# Patient Record
Sex: Male | Born: 1945 | Race: White | Hispanic: No | Marital: Single | Smoking: Former smoker
Health system: Southern US, Community
[De-identification: ages and names within clinical notes are randomized; demographics above are authoritative.]

## PROBLEM LIST (undated history)

## (undated) DIAGNOSIS — Z87442 Personal history of urinary calculi: Secondary | ICD-10-CM

## (undated) DIAGNOSIS — S42001A Fracture of unspecified part of right clavicle, initial encounter for closed fracture: Secondary | ICD-10-CM

## (undated) DIAGNOSIS — F329 Major depressive disorder, single episode, unspecified: Secondary | ICD-10-CM

## (undated) DIAGNOSIS — Z791 Long term (current) use of non-steroidal anti-inflammatories (NSAID): Secondary | ICD-10-CM

## (undated) DIAGNOSIS — I442 Atrioventricular block, complete: Secondary | ICD-10-CM

## (undated) DIAGNOSIS — I10 Essential (primary) hypertension: Secondary | ICD-10-CM

## (undated) DIAGNOSIS — F32A Depression, unspecified: Secondary | ICD-10-CM

## (undated) DIAGNOSIS — R51 Headache: Secondary | ICD-10-CM

## (undated) DIAGNOSIS — K625 Hemorrhage of anus and rectum: Secondary | ICD-10-CM

## (undated) DIAGNOSIS — F419 Anxiety disorder, unspecified: Secondary | ICD-10-CM

## (undated) DIAGNOSIS — E876 Hypokalemia: Secondary | ICD-10-CM

## (undated) DIAGNOSIS — R011 Cardiac murmur, unspecified: Secondary | ICD-10-CM

## (undated) DIAGNOSIS — R519 Headache, unspecified: Secondary | ICD-10-CM

## (undated) DIAGNOSIS — F4322 Adjustment disorder with anxiety: Secondary | ICD-10-CM

## (undated) DIAGNOSIS — Z950 Presence of cardiac pacemaker: Secondary | ICD-10-CM

## (undated) DIAGNOSIS — E785 Hyperlipidemia, unspecified: Secondary | ICD-10-CM

## (undated) DIAGNOSIS — F909 Attention-deficit hyperactivity disorder, unspecified type: Secondary | ICD-10-CM

## (undated) DIAGNOSIS — J309 Allergic rhinitis, unspecified: Secondary | ICD-10-CM

## (undated) DIAGNOSIS — E119 Type 2 diabetes mellitus without complications: Secondary | ICD-10-CM

## (undated) HISTORY — PX: TONSILLECTOMY: SHX5217

## (undated) HISTORY — DX: Adjustment disorder with anxiety: F43.22

## (undated) HISTORY — PX: COLONOSCOPY: SHX174

## (undated) HISTORY — DX: Long term (current) use of non-steroidal anti-inflammatories (nsaid): Z79.1

## (undated) HISTORY — DX: Hemorrhage of anus and rectum: K62.5

---

## 1998-07-16 ENCOUNTER — Emergency Department (HOSPITAL_COMMUNITY): Admission: EM | Admit: 1998-07-16 | Discharge: 1998-07-16 | Payer: Self-pay | Admitting: Emergency Medicine

## 2006-03-22 ENCOUNTER — Ambulatory Visit: Payer: Self-pay | Admitting: Internal Medicine

## 2006-04-11 ENCOUNTER — Ambulatory Visit: Payer: Self-pay | Admitting: Internal Medicine

## 2006-04-17 ENCOUNTER — Ambulatory Visit: Payer: Self-pay | Admitting: Internal Medicine

## 2006-04-25 ENCOUNTER — Ambulatory Visit: Payer: Self-pay | Admitting: Internal Medicine

## 2007-01-03 HISTORY — PX: ORIF CLAVICLE FRACTURE: SUR924

## 2008-01-22 ENCOUNTER — Encounter: Payer: Self-pay | Admitting: Internal Medicine

## 2008-01-22 DIAGNOSIS — K625 Hemorrhage of anus and rectum: Secondary | ICD-10-CM

## 2008-01-22 HISTORY — DX: Hemorrhage of anus and rectum: K62.5

## 2008-02-11 ENCOUNTER — Ambulatory Visit: Payer: Self-pay | Admitting: Internal Medicine

## 2008-02-13 LAB — CONVERTED CEMR LAB
Albumin: 3.8 g/dL (ref 3.5–5.2)
Basophils Absolute: 0.6 10*3/uL — ABNORMAL HIGH (ref 0.0–0.1)
Bilirubin Urine: NEGATIVE
Cholesterol: 187 mg/dL (ref 0–200)
Creatinine, Ser: 0.9 mg/dL (ref 0.4–1.5)
Eosinophils Absolute: 0.5 10*3/uL (ref 0.0–0.6)
GFR calc Af Amer: 110 mL/min
HCT: 39.9 % (ref 39.0–52.0)
HDL: 24.5 mg/dL — ABNORMAL LOW (ref >39.0)
Hemoglobin: 13.7 g/dL (ref 13.0–17.0)
Hgb A1c MFr Bld: 5.9 % (ref 4.6–6.0)
LDL Cholesterol: 132 mg/dL — ABNORMAL HIGH (ref 0–99)
Leukocytes, UA: NEGATIVE
MCHC: 34.4 g/dL (ref 30.0–36.0)
MCV: 83.5 fL (ref 78.0–100.0)
Monocytes Absolute: 0.7 10*3/uL (ref 0.2–0.7)
Monocytes Relative: 6 % (ref 3.0–11.0)
Neutro Abs: 7.5 10*3/uL (ref 1.4–7.7)
Neutrophils Relative %: 65.4 % (ref 43.0–77.0)
PSA: 1.72 ng/mL (ref 0.10–4.00)
Potassium: 4.2 meq/L (ref 3.5–5.1)
Sodium: 142 meq/L (ref 135–145)
TSH: 1.06 microintl units/mL (ref 0.35–5.50)
Total Bilirubin: 0.6 mg/dL (ref 0.3–1.2)
Triglycerides: 152 mg/dL — ABNORMAL HIGH (ref 0–149)
Urobilinogen, UA: 0.2 (ref 0.0–1.0)
VLDL: 30 mg/dL (ref 0–40)

## 2008-02-19 ENCOUNTER — Ambulatory Visit: Payer: Self-pay | Admitting: Internal Medicine

## 2008-02-19 DIAGNOSIS — R609 Edema, unspecified: Secondary | ICD-10-CM

## 2008-02-19 DIAGNOSIS — E119 Type 2 diabetes mellitus without complications: Secondary | ICD-10-CM

## 2008-02-19 DIAGNOSIS — F329 Major depressive disorder, single episode, unspecified: Secondary | ICD-10-CM

## 2008-02-19 DIAGNOSIS — I10 Essential (primary) hypertension: Secondary | ICD-10-CM

## 2008-02-19 DIAGNOSIS — J309 Allergic rhinitis, unspecified: Secondary | ICD-10-CM | POA: Insufficient documentation

## 2008-02-19 DIAGNOSIS — F411 Generalized anxiety disorder: Secondary | ICD-10-CM | POA: Insufficient documentation

## 2008-02-19 DIAGNOSIS — E785 Hyperlipidemia, unspecified: Secondary | ICD-10-CM | POA: Insufficient documentation

## 2008-02-27 ENCOUNTER — Encounter: Payer: Self-pay | Admitting: Internal Medicine

## 2008-03-03 ENCOUNTER — Encounter: Payer: Self-pay | Admitting: Internal Medicine

## 2008-04-09 ENCOUNTER — Ambulatory Visit: Payer: Self-pay | Admitting: Internal Medicine

## 2008-04-09 LAB — CONVERTED CEMR LAB
AST: 16 units/L (ref 0–37)
Albumin: 3.8 g/dL (ref 3.5–5.2)
Alkaline Phosphatase: 70 units/L (ref 39–117)
Total Bilirubin: 0.9 mg/dL (ref 0.3–1.2)
Total CHOL/HDL Ratio: 7.3
Triglycerides: 60 mg/dL (ref 0–149)

## 2008-08-04 ENCOUNTER — Ambulatory Visit: Payer: Self-pay | Admitting: Internal Medicine

## 2008-08-04 DIAGNOSIS — F4322 Adjustment disorder with anxiety: Secondary | ICD-10-CM

## 2008-08-04 DIAGNOSIS — F988 Other specified behavioral and emotional disorders with onset usually occurring in childhood and adolescence: Secondary | ICD-10-CM | POA: Insufficient documentation

## 2008-08-04 DIAGNOSIS — IMO0002 Reserved for concepts with insufficient information to code with codable children: Secondary | ICD-10-CM

## 2008-08-10 ENCOUNTER — Encounter: Payer: Self-pay | Admitting: Internal Medicine

## 2008-08-30 ENCOUNTER — Ambulatory Visit: Payer: Self-pay | Admitting: Internal Medicine

## 2008-08-30 LAB — CONVERTED CEMR LAB
Calcium: 8.8 mg/dL (ref 8.4–10.5)
GFR calc Af Amer: 126 mL/min
HDL: 30.8 mg/dL — ABNORMAL LOW (ref >39.0)
Hgb A1c MFr Bld: 5.6 % (ref 4.6–6.0)
Sodium: 142 meq/L (ref 135–145)
Total CHOL/HDL Ratio: 5.7
VLDL: 14 mg/dL (ref 0–40)

## 2008-09-01 ENCOUNTER — Ambulatory Visit: Payer: Self-pay | Admitting: Internal Medicine

## 2008-09-23 ENCOUNTER — Encounter: Payer: Self-pay | Admitting: Internal Medicine

## 2008-09-24 ENCOUNTER — Encounter: Payer: Self-pay | Admitting: Internal Medicine

## 2008-11-08 ENCOUNTER — Ambulatory Visit: Payer: Self-pay | Admitting: Internal Medicine

## 2008-11-08 DIAGNOSIS — J019 Acute sinusitis, unspecified: Secondary | ICD-10-CM

## 2009-01-06 ENCOUNTER — Telehealth (INDEPENDENT_AMBULATORY_CARE_PROVIDER_SITE_OTHER): Payer: Self-pay | Admitting: *Deleted

## 2009-11-02 ENCOUNTER — Ambulatory Visit: Payer: Self-pay | Admitting: Internal Medicine

## 2009-11-02 LAB — CONVERTED CEMR LAB
AST: 16 units/L (ref 0–37)
Albumin: 4 g/dL (ref 3.5–5.2)
Alkaline Phosphatase: 77 units/L (ref 39–117)
Bilirubin Urine: NEGATIVE
CO2: 29 meq/L (ref 19–32)
Eosinophils Relative: 4.9 % (ref 0.0–5.0)
GFR calc non Af Amer: 103.69 mL/min (ref >60)
Glucose, Bld: 113 mg/dL — ABNORMAL HIGH (ref 70–99)
HDL: 28.4 mg/dL — ABNORMAL LOW (ref >39.00)
Ketones, ur: NEGATIVE mg/dL
LDL Cholesterol: 106 mg/dL — ABNORMAL HIGH (ref 0–99)
Leukocytes, UA: NEGATIVE
Monocytes Absolute: 0.7 10*3/uL (ref 0.1–1.0)
Monocytes Relative: 6.8 % (ref 3.0–12.0)
Neutrophils Relative %: 56.2 % (ref 43.0–77.0)
PSA: 2.14 ng/mL (ref 0.10–4.00)
Platelets: 175 10*3/uL (ref 150.0–400.0)
Potassium: 4 meq/L (ref 3.5–5.1)
Sodium: 141 meq/L (ref 135–145)
Specific Gravity, Urine: 1.02 (ref 1.000–1.030)
Total CHOL/HDL Ratio: 6
Urobilinogen, UA: 0.2 (ref 0.0–1.0)
WBC: 10.6 10*3/uL — ABNORMAL HIGH (ref 4.5–10.5)

## 2009-11-03 LAB — CONVERTED CEMR LAB: Hgb A1c MFr Bld: 5.7 % (ref 4.6–6.5)

## 2009-11-09 ENCOUNTER — Ambulatory Visit: Payer: Self-pay | Admitting: Internal Medicine

## 2009-11-09 DIAGNOSIS — S42001A Fracture of unspecified part of right clavicle, initial encounter for closed fracture: Secondary | ICD-10-CM

## 2009-11-09 DIAGNOSIS — R972 Elevated prostate specific antigen [PSA]: Secondary | ICD-10-CM | POA: Insufficient documentation

## 2009-11-09 DIAGNOSIS — M549 Dorsalgia, unspecified: Secondary | ICD-10-CM | POA: Insufficient documentation

## 2009-11-09 DIAGNOSIS — S42009A Fracture of unspecified part of unspecified clavicle, initial encounter for closed fracture: Secondary | ICD-10-CM | POA: Insufficient documentation

## 2009-11-09 HISTORY — DX: Fracture of unspecified part of right clavicle, initial encounter for closed fracture: S42.001A

## 2009-11-24 ENCOUNTER — Telehealth: Payer: Self-pay | Admitting: Internal Medicine

## 2009-11-28 ENCOUNTER — Encounter: Payer: Self-pay | Admitting: Internal Medicine

## 2009-11-30 ENCOUNTER — Encounter: Payer: Self-pay | Admitting: Internal Medicine

## 2010-01-31 ENCOUNTER — Encounter: Payer: Self-pay | Admitting: Internal Medicine

## 2010-03-03 ENCOUNTER — Telehealth: Payer: Self-pay | Admitting: Internal Medicine

## 2010-10-05 ENCOUNTER — Telehealth (INDEPENDENT_AMBULATORY_CARE_PROVIDER_SITE_OTHER): Payer: Self-pay | Admitting: *Deleted

## 2010-10-19 ENCOUNTER — Telehealth: Payer: Self-pay | Admitting: Internal Medicine

## 2010-10-19 ENCOUNTER — Ambulatory Visit: Payer: Self-pay | Admitting: Internal Medicine

## 2010-10-19 DIAGNOSIS — R000 Tachycardia, unspecified: Secondary | ICD-10-CM

## 2010-10-19 LAB — CONVERTED CEMR LAB
ALT: 49 units/L (ref 0–53)
AST: 35 units/L (ref 0–37)
Albumin: 4.2 g/dL (ref 3.5–5.2)
Alkaline Phosphatase: 109 units/L (ref 39–117)
BUN: 27 mg/dL — ABNORMAL HIGH (ref 6–23)
Bilirubin Urine: NEGATIVE
CO2: 23 meq/L (ref 19–32)
Chloride: 91 meq/L — ABNORMAL LOW (ref 96–112)
Cholesterol, target level: 200 mg/dL
Glucose, Bld: 702 mg/dL (ref 70–99)
HDL goal, serum: 40 mg/dL
LDL Goal: 100 mg/dL
Potassium: 5.1 meq/L (ref 3.5–5.1)
TSH: 3.09 microintl units/mL (ref 0.35–5.50)
Total Protein, Urine: NEGATIVE mg/dL
Urine Glucose: >=1000 mg/dL
pH: 5.5 (ref 5.0–8.0)

## 2010-10-20 ENCOUNTER — Encounter: Payer: Self-pay | Admitting: Internal Medicine

## 2010-10-20 ENCOUNTER — Telehealth: Payer: Self-pay | Admitting: Internal Medicine

## 2010-10-20 ENCOUNTER — Emergency Department (HOSPITAL_COMMUNITY): Admission: EM | Admit: 2010-10-20 | Discharge: 2010-10-20 | Payer: Self-pay | Admitting: Emergency Medicine

## 2010-10-20 LAB — CONVERTED CEMR LAB
Basophils Relative: 1 % (ref 0–1)
Hemoglobin: 15.1 g/dL (ref 13.0–17.0)
Lymphs Abs: 2.4 10*3/uL (ref 0.7–4.0)
MCHC: 32.3 g/dL (ref 30.0–36.0)
Monocytes Absolute: 0.8 10*3/uL (ref 0.1–1.0)
Monocytes Relative: 6 % (ref 3–12)
Neutro Abs: 9.6 10*3/uL — ABNORMAL HIGH (ref 1.7–7.7)
Neutrophils Relative %: 73 % (ref 43–77)
RBC: 5.29 M/uL (ref 4.22–5.81)
WBC: 13.2 10*3/uL — ABNORMAL HIGH (ref 4.0–10.5)

## 2010-10-24 ENCOUNTER — Ambulatory Visit: Payer: Self-pay | Admitting: Internal Medicine

## 2010-10-24 ENCOUNTER — Telehealth: Payer: Self-pay | Admitting: Internal Medicine

## 2010-11-03 ENCOUNTER — Ambulatory Visit: Payer: Self-pay | Admitting: Internal Medicine

## 2010-11-10 ENCOUNTER — Ambulatory Visit: Payer: Self-pay | Admitting: Internal Medicine

## 2010-11-10 ENCOUNTER — Encounter: Payer: Self-pay | Admitting: Internal Medicine

## 2010-12-12 ENCOUNTER — Telehealth: Payer: Self-pay | Admitting: Internal Medicine

## 2010-12-14 ENCOUNTER — Encounter: Payer: Self-pay | Admitting: Internal Medicine

## 2010-12-26 ENCOUNTER — Telehealth (INDEPENDENT_AMBULATORY_CARE_PROVIDER_SITE_OTHER): Payer: Self-pay | Admitting: *Deleted

## 2010-12-27 ENCOUNTER — Encounter: Payer: Self-pay | Admitting: Internal Medicine

## 2011-01-04 NOTE — Progress Notes (Signed)
Summary: Adderall  Phone Note Call from Patient Call back at Home Phone [phone]   Caller: Patient Summary of Call: pt called requesting refills of Adderall Initial call taken by: Margaret Pyle, CMA,  March 03, 2010 10:36 AM  Follow-up for Phone Call        pt informed via Secure VM Follow-up by: Margaret Pyle, CMA,  March 03, 2010 1:07 PM    New/Updated Medications: ADDERALL 30 MG TABS (AMPHETAMINE-DEXTROAMPHETAMINE) 1/2 by mouth three times a day - to fill Mar 03, 2010 Prescriptions: ADDERALL 30 MG TABS (AMPHETAMINE-DEXTROAMPHETAMINE) 1/2 by mouth three times a day - to fill Mar 03, 2010  #135 x 0   Entered and Authorized by:   Corwin Levins MD   Signed by:   Corwin Levins MD on 03/03/2010   Method used:   Print then Give to Patient   RxID:   [id number]  done hardcopy to LIM side B - dahlia  Corwin Levins MD  March 03, 2010 12:57 PM

## 2011-01-04 NOTE — Progress Notes (Signed)
Summary: REQ FOR RX  Phone Note Call from Patient Call back at Home Phone [phone]   Summary of Call: See previous phone notes. Pt was seen in the ER, given insulin and left w/short supply of metformin 500mg  1 once daily. He is req rx to continue med until see in office Dec 9th. OK?  Initial call taken by: Lamar Sprinkles, CMA,  October 24, 2010 10:02 AM  Follow-up for Phone Call        ok with me Follow-up by: Corwin Levins MD,  October 24, 2010 10:48 AM  Additional Follow-up for Phone Call Additional follow up Details #1::        called patient to inform prescription requested has been sent to his pharmacy. Additional Follow-up by: Robin Ewing CMA (AAMA),  October 24, 2010 11:30 AM    New/Updated Medications: METFORMIN HCL 500 MG TABS (METFORMIN HCL) 1 by mouth once daily Prescriptions: METFORMIN HCL 500 MG TABS (METFORMIN HCL) 1 by mouth once daily  #30 x 1   Entered by:   Scharlene Gloss CMA (AAMA)   Authorized by:   Corwin Levins MD   Signed by:   Scharlene Gloss CMA (AAMA) on 10/24/2010   Method used:   Faxed to ...       [facility]* (retail)       [address]       [place], [place]  [id number]       Ph: [phone]       Fax: [phone]   RxID:   [id number]

## 2011-01-04 NOTE — Medication Information (Signed)
Summary: Approved-Adderall/BCBS  Approved-Adderall/BCBS   Imported By: Sherian Rein 12/07/2009 07:18:22  _____________________________________________________________________  External Attachment:    Type:   Image     Comment:   External Document

## 2011-01-04 NOTE — Progress Notes (Signed)
Summary: LANCETS  Phone Note Call from Patient   Summary of Call: Needs delica lancets. Initial call taken by: Lamar Sprinkles, CMA,  December 12, 2010 10:11 AM    New/Updated Medications: Dola Argyle LANCETS  MISC (LANCETS) once daily dx: 250.00 Prescriptions: ONETOUCH DELICA LANCETS  MISC (LANCETS) once daily dx: 250.00  #3 mth x 1   Entered by:   Lamar Sprinkles, CMA   Authorized by:   Corwin Levins MD   Signed by:   Lamar Sprinkles, CMA on 12/12/2010   Method used:   Electronically to        [facility]* (retail)       [address]       [place], [place]  [id number]       Ph: [phone]       Fax: [phone]   RxID:   [id number]

## 2011-01-04 NOTE — Progress Notes (Signed)
----   Converted from flag ---- ---- 10/04/2010 5:05 PM, Corwin Levins MD wrote: ok with me  ---- 10/04/2010 12:42 PM, Ivar Bury wrote: Dr Jonny Ruiz,  Your pt Jeremy Abbott is requesting a physical for this yr.  Your next availability is Jan.  Is it ok to work pt in this yr for a physical.  Thank you for your reply. ------------------------------ Gave pt appt:  11/10/10 @ 2p w/Dr Jonny Ruiz  phone

## 2011-01-04 NOTE — Letter (Signed)
Summary: Referral - not able to see patient  [facility]  [address] [place], [place] [address]   Phone: [phone]  Fax: [phone]    January 31, 2010   Dr. Oliver Barre, M.D. [address] [place], [place] [address]   Re:   Jeremy Abbott DOB:  [birth year] MRN:   [id number]    Dear Dr. Jonny Ruiz:  Thank you for your kind referral of the above patient.  We have attempted to schedule the recommended procedure Direct Colonoscopy but have not been able to schedule because:   X  The patient was not available by phone and/or has not returned our calls.  ___ The patient declined to schedule the procedure at this time.  We appreciate the referral and hope that we will have the opportunity to treat this patient in the future.    Sincerely,    [facility] [phone]

## 2011-01-04 NOTE — Assessment & Plan Note (Signed)
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  History of Present Illness: here for wellness and f/u; unfortunately developed severe DM with 3 wks of dietary and med noncompliacne prior to seeing Dr Yetta Barre ,  seen in the ER next day with BS 541, tx with insullin a nd gradually improved as well over the next few days, now back to checking his sugars  with new glucometer, back on metformin with good med and dietaary complaince;  over the last 9 days average has been 220 though , cant seem to get lower than that on current treatment.  Pt denies CP, worsening sob, doe, wheezing, orthopnea, pnd, worsening LE edema, palps, dizziness or syncope  Pt denies new neuro symptoms such as headache, facial or extremity weakness  Pt denies polydipsia, polyuria no that sugars are improved, or low sugar symptoms such as shakiness improved with eating.  Overall good compliance with meds, trying to follow low chol, DM diet, wt stable, little excercise however No fever, wt loss, night sweats, loss of appetite or other constitutional symptoms  Denies worsening depressive symptoms, suicidal ideation, or panic.    ADD seems overall stable with currnet meds, able to function socailly and work.  Preventive Screening-Counseling & Management      Drug Use:  no.    Problems Prior to Update: 1)  Tachycardia  (ICD-785.0) 2)  Back Pain  (ICD-724.5) 3)  Psa, Increased  (ICD-790.93) 4)  Fracture, Clavicle, Right  (ICD-810.00) 5)  Preventive Health Care  (ICD-V70.0) 6)  Sinusitis-  Acute-nos  (ICD-461.9) 7)  Add  (ICD-314.00) 8)  Adjustment Disorder With Anxiety  (ICD-309.24) 9)  Shoulder Strain, Right  (ICD-840.9) 10)  Depression  (ICD-311) 11)  Anxiety  (ICD-300.00) 12)  Allergic Rhinitis  (ICD-477.9) 13)  Peripheral Edema  (ICD-782.3) 14)  Diabetes Mellitus, Type II  (ICD-250.00) 15)  Hypertension  (ICD-401.9) 16)  Hyperlipidemia  (ICD-272.4) 17)  Preventive Health Care  (ICD-V70.0) 18)  Rectal Bleeding  (ICD-569.3)  Medications Prior to Update: 1)  Lisinopril 20 Mg Tabs (Lisinopril) .Marland Kitchen.. 1 By Mouth Once Daily 2)  Ecotrin Low Strength 81 Mg  Tbec (Aspirin) .Marland Kitchen.. 1 By Mouth Qd 3)  Vitamin E 1000 Unit  Crea (Vitamin E) .Marland Kitchen.. 1 By Mouth Qd 4)  Motrin Ib 200 Mg  Tabs (Ibuprofen) .Marland Kitchen.. 1 By Mouth Qid Prn 5)  Vit .c .Marland Kitchen.. 1 By Mouth Qd 6)  Simvastatin 40 Mg Tabs (Simvastatin) .Marland Kitchen.. 1 By Mouth Once Daily 7)  Viagra 100 Mg  Tabs (Sildenafil Citrate) .Marland Kitchen.. 1 By Mouth Once Daily As Needed 8)  Adderall 30 Mg Tabs (Amphetamine-Dextroamphetamine) .... 1/2 By Mouth Three Times A Day - To Fill Mar 03, 2010 9)  Hydrochlorothiazide 25 Mg Tabs (Hydrochlorothiazide) .Marland Kitchen.. 1 By Mouth Once Daily 10)  Klor-Con 10 10 Meq Cr-Tabs (Potassium Chloride) .Marland KitchenMarland KitchenMarland Kitchen  1po Once Daily 11)  Hydrocodone-Acetaminophen 7.5-325 Mg Tabs (Hydrocodone-Acetaminophen) .Marland Kitchen.. 1 By Mouth Four Times Per Day As Needed 12)  Metformin Hcl 500 Mg Tabs (Metformin Hcl) .Marland Kitchen.. 1 Tab By Mouth Two Times A Day 13)  Onetouch Ultra Blue  Strp (Glucose Blood) .... Once Daily Dx:250.00 14)  Onetouch Ultrasoft Lancets  Misc (Lancets) .... Once Daily Dx: 250.00  Current Medications (verified): 1)  Lisinopril 20 Mg Tabs (Lisinopril) .Marland Kitchen.. 1 By Mouth Once Daily 2)  Ecotrin Low Strength 81 Mg  Tbec (Aspirin) .Marland Kitchen.. 1 By Mouth Qd 3)  Vitamin E 1000 Unit  Crea (Vitamin E) .Marland Kitchen.. 1 By Mouth Qd 4)  Motrin Ib 200 Mg  Tabs (Ibuprofen) .Marland Kitchen.. 1 By Mouth Qid Prn 5)  Vit .c .Marland Kitchen.. 1 By Mouth Qd 6)  Simvastatin 40 Mg Tabs (Simvastatin) .Marland Kitchen.. 1 By Mouth  Once Daily 7)  Viagra 100 Mg  Tabs (Sildenafil Citrate) .Marland Kitchen.. 1 By Mouth Once Daily As Needed 8)  Adderall 30 Mg Tabs (Amphetamine-Dextroamphetamine) .... 1/2 By Mouth Three Times A Day - To Fill Nov 10, 2010 9)  Hydrochlorothiazide 25 Mg Tabs (Hydrochlorothiazide) .Marland Kitchen.. 1 By Mouth Once Daily 10)  Klor-Con 10 10 Meq Cr-Tabs (Potassium Chloride) .Marland Kitchen.. 1po Once Daily 11)  Hydrocodone-Acetaminophen 7.5-325 Mg Tabs (Hydrocodone-Acetaminophen) .Marland Kitchen.. 1 By Mouth Four Times Per Day As Needed 12)  Metformin Hcl 500 Mg Xr24h-Tab (Metformin Hcl) .... 4 By Mouth Qam 13)  Onetouch Ultra Blue  Strp (Glucose Blood) .... Once Daily Dx:250.00 14)  Onetouch Ultrasoft Lancets  Misc (Lancets) .... Once Daily Dx: 250.00  Allergies (verified): No Known Drug Allergies  Past History:  Past Medical History: Last updated: 11/09/2009 Hyperlipidemia Hypertension Diabetes mellitus, type II chronic right collarbone pain - non-union chronic hsaid use Allergic rhinitis ADD Anxiety Depression chronic venous edema  Past Surgical History: Last updated: 02/19/2008 Tonsillectomy s/p collorbone fx 2/08 with fall - [facility]phy wainer  Family History: Last updated: 02/19/2008 brother with DM  Social History: Last updated: 11/10/2010 retired - Forensic scientist  Married no biol children Alcohol use-no Former Smoker Marital Status: Married Drug use-no  Risk Factors: Alcohol Use: 0 (10/19/2010)  Risk Factors: Smoking Status: quit (10/19/2010)  Social History: 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         all otherwise negative per pt -    Physical Exam  General:  alert and overweight-appearing.   Head:  normocephalic, atraumatic, no abnormalities observed, and no abnormalities palpated.   Eyes:  vision grossly intact, pupils equal, pupils round, pupils reactive to light, and no injection.   Ears:  R ear normal and L ear normal.   Nose:  no external deformity and no nasal discharge.   Mouth:  no gingival abnormalities and pharynx pink and moist.   Neck:  supple and no masses.   Lungs:  normal respiratory effort and normal breath sounds.   Heart:  normal rate and regular rhythm.   Abdomen:  soft, non-tender, and normal bowel sounds.   Msk:  no joint tenderness and no joint swelling.   Extremities:  no edema, no  erythema  Neurologic:  strength normal in all extremities and gait normal.   Skin:  color normal and no rashes.   Psych:  not depressed appearing and moderately anxious.     Impression & Recommendations:  Problem # 1:  Preventive Health Care (ICD-V70.0) Overall doing well, age appropriate education and counseling updated, referral for preventive services and immunizations addressed, dietary counseling and smoking status adressed , most recent labs reviewed I have personally reviewed and have noted 1.The patient's medical and social history 2.Their use of alcohol, tobacco or illicit drugs 3.Their current medications and supplements 4. Functional ability including ADL's, fall risk, home safety risk, hearing & visual impairment  5.Diet and physical activities 6.Evidence for depression or mood disorders The patients weight, height, BMI  have been recorded in the chart I have made referrals, counseling and provided education to the patient based review of the above  Orders: EKG w/ Interpretation ([address]) Gastroenterology Referral (GI)  Problem # 2:   DIABETES MELLITUS, TYPE II (ICD-250.00)  His updated medication list for this problem includes:    Lisinopril 20 Mg Tabs (Lisinopril) .Marland Kitchen... 1 by mouth once daily    Ecotrin Low Strength 81 Mg Tbec (Aspirin) .Marland Kitchen... 1 by mouth qd    Metformin Hcl 500 Mg Xr24h-tab (Metformin hcl) .Marland KitchenMarland KitchenMarland KitchenMarland Kitchen 4 by mouth qam back to better complaicne with diet, also good compliance with meds; pt prefers to stay with metformin instead of change to komblgyze  Labs Reviewed: Creat: 1.0 (10/19/2010)    Reviewed HgBA1c results: 12.6 (10/19/2010)  5.7 (11/02/2009)  Problem # 3:  FRACTURE, CLAVICLE, RIGHT (ICD-810.00) with recurring chronic pain -Continue all previous medications as before this visit   Problem # 4:  ADD (ICD-314.00) stable overall by hx and exam, ok to continue meds/tx as is - meds refilled today  Complete Medication List: 1)  Lisinopril 20 Mg Tabs (Lisinopril) .Marland Kitchen.. 1 by mouth once daily 2)  Ecotrin Low Strength 81 Mg Tbec (Aspirin) .Marland Kitchen.. 1 by mouth qd 3)  Vitamin E 1000 Unit Crea (Vitamin e) .Marland Kitchen.. 1 by mouth qd 4)  Motrin Ib 200 Mg Tabs (Ibuprofen) .Marland Kitchen.. 1 by mouth qid prn 5)  Vit .c  .Marland Kitchen.. 1 by mouth qd 6)  Simvastatin 40 Mg Tabs (Simvastatin) .Marland Kitchen.. 1 by mouth once daily 7)  Viagra 100 Mg Tabs (Sildenafil citrate) .Marland Kitchen.. 1 by mouth once daily as needed 8)  Adderall 30 Mg Tabs (Amphetamine-dextroamphetamine) .... 1/2 by mouth three times a day - to fill Nov 10, 2010 9)  Hydrochlorothiazide 25 Mg Tabs (Hydrochlorothiazide) .Marland Kitchen.. 1 by mouth once daily 10)  Klor-con 10 10 Meq Cr-tabs (Potassium chloride) .Marland Kitchen.. 1po once daily 11)  Hydrocodone-acetaminophen 7.5-325 Mg Tabs (Hydrocodone-acetaminophen) .Marland Kitchen.. 1 by mouth four times per day as needed 12)  Metformin Hcl 500 Mg Xr24h-tab (Metformin hcl) .... 4 by mouth qam 13)  Onetouch Ultra Blue Strp (Glucose blood) .... Once daily dx:250.00 14)  Onetouch Ultrasoft Lancets Misc (Lancets) .... Once daily dx: 250.00  Patient Instructions: 1)  incresase the metformin as  we discussed 2)  Continue all previous medications as before this visit  3)  You will be contacted about the referral(s) to: colonoscopy 4)  Please schedule a follow-up appointment in 6 months with: 5)  BMP prior to visit, ICD-9: 250.02 6)  Lipid Panel prior to visit, ICD-9: 7)  HbgA1C prior to visit, ICD-9: Prescriptions: ADDERALL 30 MG TABS (AMPHETAMINE-DEXTROAMPHETAMINE) 1/2 by mouth three times a day - to fill Nov 10, 2010  #135 x  0   Entered and Authorized by:   Corwin Levins MD   Signed by:   Corwin Levins MD on 11/10/2010   Method used:   Print then Give to Patient   RxID:   [id number] [facility] ULTRASOFT LANCETS  MISC (LANCETS) once daily dx: 250.00  #100 x 1   Entered and Authorized by:   Corwin Levins MD   Signed by:   Corwin Levins MD on 11/10/2010   Method used:   Print then Give to Patient   RxID:   [id number] ONETOUCH ULTRA BLUE  STRP (GLUCOSE BLOOD) once daily dx:250.00  #100 x 1   Entered and Authorized by:   Corwin Levins MD   Signed by:   Corwin Levins MD on 11/10/2010   Method used:   Print then Give to Patient   RxID:   [id number] HYDROCODONE-ACETAMINOPHEN 7.5-325 MG TABS (HYDROCODONE-ACETAMINOPHEN) 1 by mouth four times per day as needed  #40 x 1   Entered and Authorized by:   Corwin Levins MD   Signed by:   Corwin Levins MD on 11/10/2010   Method used:   Print then Give to Patient   RxID:   [id number] KLOR-CON 10 10 MEQ CR-TABS (POTASSIUM CHLORIDE) 1po once daily  #90 x 3   Entered and Authorized by:   Corwin Levins MD   Signed by:   Corwin Levins MD on 11/10/2010   Method used:   Print then Give to Patient   RxID:   [id number] HYDROCHLOROTHIAZIDE 25 MG TABS (HYDROCHLOROTHIAZIDE) 1 by mouth once daily  #90 x 3   Entered and Authorized by:   Corwin Levins MD   Signed by:   Corwin Levins MD on 11/10/2010   Method used:   Print then Give to Patient   RxID:   [id number] VIAGRA 100 MG  TABS (SILDENAFIL CITRATE) 1 by mouth once daily as  needed  #5 x 11   Entered and Authorized by:   Corwin Levins MD   Signed by:   Corwin Levins MD on 11/10/2010   Method used:   Print then Give to Patient   RxID:   [id number] SIMVASTATIN 40 MG TABS (SIMVASTATIN) 1 by mouth once daily  #90 x 3   Entered and Authorized by:   Corwin Levins MD   Signed by:   Corwin Levins MD on 11/10/2010   Method used:   Print then Give to Patient   RxID:   [id number] LISINOPRIL 20 MG TABS (LISINOPRIL) 1 by mouth once daily  #90 x 3   Entered and Authorized by:   Corwin Levins MD   Signed by:   Corwin Levins MD on 11/10/2010   Method used:   Print then Give to Patient   RxID:   [id number] METFORMIN HCL 500 MG XR24H-TAB (METFORMIN HCL) 4 by mouth qam  #360 x 3   Entered and Authorized by:   Corwin Levins MD   Signed by:   Corwin Levins MD on 11/10/2010   Method used:   Print then Give to Patient   RxID:   [id number]    Orders Added: 1)  EKG w/ Interpretation [93000] 2)  Gastroenterology Referral [GI] 3)  Est. Patient 40-64 years [[address]96]

## 2011-01-04 NOTE — Progress Notes (Signed)
Summary: Critical Lab  Phone Note From Other Clinic   Summary of Call: LAB CALLED W/CRITICAL: Urine glucose greater than or equal to 1000.  Initial call taken by: Lamar Sprinkles, CMA,  October 19, 2010 12:20 PM

## 2011-01-04 NOTE — Progress Notes (Signed)
Summary: ER  Phone Note Outgoing Call   Summary of Call: LA- please confirm that he knows about his blood sugar.  Thanks, TJ Initial call taken by: Etta Grandchild MD,  October 20, 2010 11:42 AM  Follow-up for Phone Call        Pt walked into the office upset regarding diabetic eye exam. He feels confused by the diabetic dx. I explained that he was dx by Dr Jonny Ruiz 2009 but managed by diet/lifestyle and he remembered. I stressed the urgency of pt to get to the ER due to abnormal labs. Explained that they would stabilze pt w/IV fluids and diabetic meds if needed and keep close monitor on his status. Provided pt a copy of labs to take to ER and again stressed the danger of him not going to ER for eval. He agreed to go to ER now.  Follow-up by: Lamar Sprinkles, CMA,  October 20, 2010 12:07 PM

## 2011-01-04 NOTE — Consult Note (Signed)
Summary: [facility]  [facility]   Imported By: Sherian Rein 12/19/2010 15:01:40  _____________________________________________________________________  External Attachment:    Type:   Image     Comment:   External Document

## 2011-01-04 NOTE — Assessment & Plan Note (Signed)
Summary: SEE Sarah/GLUCOMETER/SD  Nurse Visit   Vitals Entered By: Lamar Sprinkles, CMA (October 24, 2010 2:57 PM) CC: Glucometer Comments Patient seen in ER and was advised to get glucometer. Recently started on metformin in the ER. Explained Onetouch ultra2 glucometer and provided sample. Also provided diabetic paperwork to read over. Pt has f/u office visit Dec 9th w/Dr Jonny Ruiz.    Current Medications (verified): 1)  Lisinopril 20 Mg Tabs (Lisinopril) .Marland Kitchen.. 1 By Mouth Once Daily 2)  Ecotrin Low Strength 81 Mg  Tbec (Aspirin) .Marland Kitchen.. 1 By Mouth Qd 3)  Vitamin E 1000 Unit  Crea (Vitamin E) .Marland Kitchen.. 1 By Mouth Qd 4)  Motrin Ib 200 Mg  Tabs (Ibuprofen) .Marland Kitchen.. 1 By Mouth Qid Prn 5)  Vit .c .Marland Kitchen.. 1 By Mouth Qd 6)  Simvastatin 40 Mg Tabs (Simvastatin) .Marland Kitchen.. 1 By Mouth Once Daily 7)  Viagra 100 Mg  Tabs (Sildenafil Citrate) .Marland Kitchen.. 1 By Mouth Once Daily As Needed 8)  Adderall 30 Mg Tabs (Amphetamine-Dextroamphetamine) .... 1/2 By Mouth Three Times A Day - To Fill Mar 03, 2010 9)  Hydrochlorothiazide 25 Mg Tabs (Hydrochlorothiazide) .Marland Kitchen.. 1 By Mouth Once Daily 10)  Klor-Con 10 10 Meq Cr-Tabs (Potassium Chloride) .Marland Kitchen.. 1po Once Daily 11)  Hydrocodone-Acetaminophen 7.5-325 Mg Tabs (Hydrocodone-Acetaminophen) .Marland Kitchen.. 1 By Mouth Four Times Per Day As Needed 12)  Metformin Hcl 500 Mg Tabs (Metformin Hcl) .Marland Kitchen.. 1 By Mouth Once Daily 13)  Onetouch Ultra Blue  Strp (Glucose Blood) .... Once Daily Dx:250.00 14)  Onetouch Ultrasoft Lancets  Misc (Lancets) .... Once Daily Dx: 250.00  Allergies (verified): No Known Drug Allergies  Orders Added: 1)  Est. Patient Level I [[address]] Prescriptions: ONETOUCH ULTRA BLUE  STRP (GLUCOSE BLOOD) once daily dx:250.00  #100 x 1   Entered by:   Lamar Sprinkles, CMA   Authorized by:   Corwin Levins MD   Signed by:   Lamar Sprinkles, CMA on 10/24/2010   Method used:   Electronically to        [facility]* (retail)       [address]       [place], [place]  [id number]  Ph: [phone]       Fax: [phone]   RxID:   [id number] Biagio Borg LANCETS  MISC (LANCETS) once daily dx: 250.00  #100 x 1   Entered by:   Lamar Sprinkles, CMA   Authorized by:   Corwin Levins MD   Signed by:   Lamar Sprinkles, CMA on 10/24/2010   Method used:   Electronically to        [facility]* (retail)       [address]       [place], [place]  [id number]       Ph: [phone]       Fax: [phone]   RxID:   [id number]

## 2011-01-04 NOTE — Assessment & Plan Note (Signed)
Summary: COUGH/ URINARY PROBLEM/ [facility] PT/NWS   Vital Signs:  Patient profile:   65 year old male Height:      69.5 inches Weight:      242 pounds BMI:     35.35 O2 Sat:      96 % on Room air Temp:     97.8 degrees F oral Pulse rate:   115 / minute Pulse rhythm:   regular Resp:     16 per minute BP sitting:   136 / 70  (left arm) Cuff size:   large  Vitals Entered By: Rock Nephew CMA (October 19, 2010 8:56 AM)  Nutrition Counseling: Patient's BMI is greater than 25 and therefore counseled on weight management options.  O2 Flow:  Room air CC: Patient c/o cough w/ lt green phlem x 1wk. Also c/o urinary urgency, URI symptoms, Lipid Management, Preventive Care Is Patient Diabetic? Yes Did you bring your meter with you today? No Pain Assessment Patient in pain? no       Does patient need assistance? Functional Status Self care Ambulation Normal   Primary Care Provider:  Corwin Levins MD  CC:  Patient c/o cough w/ lt green phlem x 1wk. Also c/o urinary urgency, URI symptoms, Lipid Management, and Preventive Care.  History of Present Illness:  URI Symptoms      This is a 65 year old man who presents with URI symptoms.  The symptoms began 3 days ago.  The severity is described as mild.  The patient reports nasal congestion, purulent nasal discharge, productive cough, and sick contacts, but denies clear nasal discharge, sore throat, dry cough, and earache.  The patient denies fever, stiff neck, dyspnea, wheezing, rash, vomiting, diarrhea, use of an antipyretic, and response to antipyretic.  The patient denies itchy throat, sneezing, headache, muscle aches, and severe fatigue.  The patient denies the following risk factors for Strep sinusitis: unilateral facial pain, unilateral nasal discharge, double sickening, tooth pain, Strep exposure, tender adenopathy, and absence of cough.    Lipid Management History:      Positive NCEP/ATP III risk factors include male age 24 years old  or older, diabetes, HDL cholesterol less than 40, and hypertension.  Negative NCEP/ATP III risk factors include no family history for ischemic heart disease, non-tobacco-user status, no ASHD (atherosclerotic heart disease), no prior stroke/TIA, no peripheral vascular disease, and no history of aortic aneurysm.        The patient states that he knows about the "Therapeutic Lifestyle Change" diet.  His compliance with the TLC diet is poor.  The patient expresses understanding of adjunctive measures for cholesterol lowering.  Adjunctive measures started by the patient include limit alcohol consumpton.  He expresses no side effects from his lipid-lowering medication.  The patient denies any symptoms to suggest myopathy or liver disease.    Preventive Screening-Counseling & Management  Alcohol-Tobacco     Alcohol drinks/day: 0     Alcohol Counseling: not indicated; patient does not drink     Smoking Status: quit     Year Quit: 1980     Tobacco Counseling: to remain off tobacco products      Sexual History:  currently monogamous.        Drug Use:  never.        Blood Transfusions:  no.    Clinical Review Panels:  Prevention   Last PSA:  2.14 (11/02/2009)  Immunizations   Last Tetanus Booster:  Tdap (11/09/2009)   Last Flu Vaccine:  given (09/02/2009)   Last Pneumovax:  Pneumovax (11/09/2009)  Lipid Management   Cholesterol:  157 (11/02/2009)   LDL (bad choesterol):  106 (11/02/2009)   HDL (good cholesterol):  28.40 (11/02/2009)  Diabetes Management   HgBA1C:  5.7 (11/02/2009)   Creatinine:  0.8 (11/02/2009)   Last Foot Exam:  yes (10/19/2010)   Last Flu Vaccine:  given (09/02/2009)   Last Pneumovax:  Pneumovax (11/09/2009)  CBC   WBC:  10.6 (11/02/2009)   RBC:  4.79 (11/02/2009)   Hgb:  14.3 (11/02/2009)   Hct:  42.5 (11/02/2009)   Platelets:  175.0 (11/02/2009)   MCV  88.7 (11/02/2009)   MCHC  33.6 (11/02/2009)   RDW  13.6 (11/02/2009)   PMN:  56.2 (11/02/2009)   Lymphs:   30.4 (11/02/2009)   Monos:  6.8 (11/02/2009)   Eosinophils:  4.9 (11/02/2009)   Basophil:  1.7 (11/02/2009)  Complete Metabolic Panel   Glucose:  113 (11/02/2009)   Sodium:  141 (11/02/2009)   Potassium:  4.0 (11/02/2009)   Chloride:  105 (11/02/2009)   CO2:  29 (11/02/2009)   BUN:  17 (11/02/2009)   Creatinine:  0.8 (11/02/2009)   Albumin:  4.0 (11/02/2009)   Total Protein:  6.5 (11/02/2009)   Calcium:  8.9 (11/02/2009)   Total Bili:  0.8 (11/02/2009)   Alk Phos:  77 (11/02/2009)   SGPT (ALT):  24 (11/02/2009)   SGOT (AST):  16 (11/02/2009)   Medications Prior to Update: 1)  Lisinopril 20 Mg Tabs (Lisinopril) .Marland Kitchen.. 1 By Mouth Once Daily 2)  Ecotrin Low Strength 81 Mg  Tbec (Aspirin) .Marland Kitchen.. 1 By Mouth Qd 3)  Vitamin E 1000 Unit  Crea (Vitamin E) .Marland Kitchen.. 1 By Mouth Qd 4)  Motrin Ib 200 Mg  Tabs (Ibuprofen) .Marland Kitchen.. 1 By Mouth Qid Prn 5)  Vit .c .Marland Kitchen.. 1 By Mouth Qd 6)  Simvastatin 40 Mg Tabs (Simvastatin) .Marland Kitchen.. 1 By Mouth Once Daily 7)  Viagra 100 Mg  Tabs (Sildenafil Citrate) .Marland Kitchen.. 1 By Mouth Once Daily As Needed 8)  Adderall 30 Mg Tabs (Amphetamine-Dextroamphetamine) .... 1/2 By Mouth Three Times A Day - To Fill Mar 03, 2010 9)  Hydrochlorothiazide 25 Mg Tabs (Hydrochlorothiazide) .Marland Kitchen.. 1 By Mouth Once Daily 10)  Klor-Con 10 10 Meq Cr-Tabs (Potassium Chloride) .Marland Kitchen.. 1po Once Daily 11)  Hydrocodone-Acetaminophen 7.5-325 Mg Tabs (Hydrocodone-Acetaminophen) .Marland Kitchen.. 1 By Mouth Four Times Per Day As Needed  Current Medications (verified): 1)  Lisinopril 20 Mg Tabs (Lisinopril) .Marland Kitchen.. 1 By Mouth Once Daily 2)  Ecotrin Low Strength 81 Mg  Tbec (Aspirin) .Marland Kitchen.. 1 By Mouth Qd 3)  Vitamin E 1000 Unit  Crea (Vitamin E) .Marland Kitchen.. 1 By Mouth Qd 4)  Motrin Ib 200 Mg  Tabs (Ibuprofen) .Marland Kitchen.. 1 By Mouth Qid Prn 5)  Vit .c .Marland Kitchen.. 1 By Mouth Qd 6)  Simvastatin 40 Mg Tabs (Simvastatin) .Marland Kitchen.. 1 By Mouth Once Daily 7)  Viagra 100 Mg  Tabs (Sildenafil Citrate) .Marland Kitchen.. 1 By Mouth Once Daily As Needed 8)  Adderall 30 Mg Tabs  (Amphetamine-Dextroamphetamine) .... 1/2 By Mouth Three Times A Day - To Fill Mar 03, 2010 9)  Hydrochlorothiazide 25 Mg Tabs (Hydrochlorothiazide) .Marland Kitchen.. 1 By Mouth Once Daily 10)  Klor-Con 10 10 Meq Cr-Tabs (Potassium Chloride) .Marland Kitchen.. 1po Once Daily 11)  Hydrocodone-Acetaminophen 7.5-325 Mg Tabs (Hydrocodone-Acetaminophen) .Marland Kitchen.. 1 By Mouth Four Times Per Day As Needed 12)  Avelox Abc Pack 400 Mg Tabs (Moxifloxacin Hcl) .... 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 brother with DM  Social History: Reviewed history from 11/09/2009 and no changes required. 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  The patient denies anorexia, fever, weight loss, hoarseness, chest pain, syncope, dyspnea on exertion, peripheral edema, headaches, hemoptysis, abdominal pain, melena, hematochezia, severe indigestion/heartburn, hematuria, suspicious skin lesions, difficulty walking, depression, enlarged lymph nodes, angioedema, and  testicular masses.   CV:  Complains of weight gain; denies chest pain or discomfort, fainting, fatigue, leg cramps with exertion, lightheadness, near fainting, palpitations, shortness of breath with exertion, and swelling of feet. GU:  Complains of decreased libido, erectile dysfunction, nocturia, and urinary frequency; denies discharge, dysuria, genital sores, hematuria, incontinence, and urinary hesitancy.  Physical Exam  General:  alert, well-developed, well-nourished, well-hydrated, healthy-appearing, cooperative to examination, overweight-appearing, disheveled, poor hygiene, and unkempt.   Head:  normocephalic, atraumatic, no abnormalities observed, and no abnormalities palpated.   Eyes:  vision grossly intact, pupils equal, pupils round, pupils reactive to light, and no injection.   Ears:  R ear normal and L ear normal.   Mouth:  good dentition, no erythema, no exudates, no posterior lymphoid hypertrophy, no postnasal drip, no pharyngeal crowing, no lesions, no aphthous ulcers, no erosions, no tongue abnormalities, no leukoplakia, no petechiae, and pharyngeal erythema.   Neck:  supple, full ROM, no masses, no thyromegaly, no JVD, normal carotid upstroke, no carotid bruits, no cervical lymphadenopathy, and no neck tenderness.   Chest Wall:  No deformities, masses, tenderness or gynecomastia noted. Breasts:  No masses or gynecomastia noted Lungs:  normal respiratory effort, no intercostal retractions, no accessory muscle use, normal breath sounds, no dullness, no fremitus, no crackles, and no wheezes.   Heart:  normal rate, regular rhythm, no murmur, no gallop, no rub, and no JVD.   Abdomen:  soft, non-tender, normal bowel sounds, no distention, no masses, no guarding, no rigidity, no rebound tenderness, no abdominal hernia, no inguinal hernia, no hepatomegaly, and no splenomegaly.   Rectal:  no external abnormalities, normal sphincter tone, no masses, no tenderness, no fissures, no fistulae, no  perianal rash, and external hemorrhoid(s).   Genitalia:  uncircumcised, no hydrocele, no varicocele, no scrotal masses, no testicular masses, no cutaneous lesions, no urethral discharge, R testes atrophic, and L testes atrophic.   Prostate:  no gland enlargement, no nodules, no asymmetry, and no induration.   Msk:  normal ROM, no joint tenderness, no joint swelling, no joint warmth, no redness over joints, no joint deformities, no joint instability, and no crepitation.   Pulses:  R femoral decreased, R popliteal decreased, R posterior tibial decreased, R dorsalis pedis decreased, L femoral decreased, L popliteal  decreased, L posterior tibial decreased, and L dorsalis pedis decreased.   Extremities:  trace left pedal edema and trace right pedal edema.   Neurologic:  alert & oriented X3, cranial nerves II-XII intact, strength normal in all extremities, sensation intact to light touch, sensation intact to pinprick, gait normal, and DTRs symmetrical and normal.   Skin:  turgor normal, color normal, no rashes, no suspicious lesions, no ecchymoses, no petechiae, no purpura, no ulcerations, and no edema.   Cervical Nodes:  no anterior cervical adenopathy and no posterior cervical adenopathy.   Axillary Nodes:  no R axillary adenopathy and no L axillary adenopathy.   Inguinal Nodes:  no R inguinal adenopathy and no L inguinal adenopathy.   Psych:  Oriented X3, memory intact for recent and remote, normally interactive, good eye contact, not anxious appearing, not depressed appearing, not agitated, not suicidal, and not homicidal.    Diabetes Management Exam:    Foot Exam (with socks and/or shoes not present):       Sensory-Pinprick/Light touch:          Left medial foot (L-4): normal          Left dorsal foot (L-5): normal          Left lateral foot (S-1): normal          Right medial foot (L-4): normal          Right dorsal foot (L-5): normal          Right lateral foot (S-1): normal        Sensory-Monofilament:          Left foot: normal          Right foot: normal       Inspection:          Left foot: normal          Right foot: normal       Nails:          Left foot: normal          Right foot: normal   Impression & Recommendations:  Problem # 1:  COUGH (ICD-786.2) Assessment New he was informed that Lisinopril can cause a cough and if his cough persists he will need to stop taking Lisinopril, today I will check a chest xray for pna, edema, mass Orders: T-2 View CXR (71020TC)  Problem # 2:  TACHYCARDIA (ICD-785.0) Assessment: New I suspect that this is due to the amphetamines that he takes, he took a dose last night, he did not agree to having an EKG done today  Problem # 3:  BRONCHITIS-ACUTE (ICD-466.0) Assessment: New  His updated medication list for this problem includes:    Avelox Abc Pack 400 Mg Tabs (Moxifloxacin hcl) ..... One by mouth once daily for 5 days  Take antibiotics and other medications as directed. Encouraged to push clear liquids, get enough rest, and take acetaminophen as needed. To be seen in 5-7 days if no improvement, sooner if worse.  Problem # 4:  PSA, INCREASED (ICD-790.93) Assessment: Unchanged  Orders: Venipuncture (04540) TLB-Lipid Panel (80061-LIPID) TLB-BMP (Basic Metabolic Panel-BMET) (80048-METABOL) TLB-CBC Platelet - w/Differential (85025-CBCD) TLB-Hepatic/Liver Function Pnl (80076-HEPATIC) TLB-TSH (Thyroid Stimulating Hormone) (84443-TSH) TLB-A1C / Hgb A1C (Glycohemoglobin) (83036-A1C) TLB-PSA (Prostate Specific Antigen) (84153-PSA) TLB-Udip w/ Micro (81001-URINE)  Problem # 5:  DIABETES MELLITUS, TYPE II (ICD-250.00) Assessment: Deteriorated  His updated medication list for this problem includes:    Lisinopril 20 Mg Tabs (Lisinopril) .Marland Kitchen... 1 by mouth once daily  Ecotrin Low Strength 81 Mg Tbec (Aspirin) .Marland Kitchen... 1 by mouth qd  Orders: Venipuncture ([address]) TLB-Lipid Panel (80061-LIPID) TLB-BMP (Basic Metabolic  Panel-BMET) (80048-METABOL) TLB-CBC Platelet - w/Differential (85025-CBCD) TLB-Hepatic/Liver Function Pnl (80076-HEPATIC) TLB-TSH (Thyroid Stimulating Hormone) (84443-TSH) TLB-A1C / Hgb A1C (Glycohemoglobin) (83036-A1C) TLB-PSA (Prostate Specific Antigen) (84153-PSA) TLB-Udip w/ Micro (81001-URINE) Ophthalmology Referral (Ophthalmology)  Labs Reviewed: Creat: 0.8 (11/02/2009)    Reviewed HgBA1c results: 5.7 (11/02/2009)  5.6 (08/30/2008)  Problem # 6:  HYPERLIPIDEMIA (ICD-272.4) Assessment: Unchanged  His updated medication list for this problem includes:    Simvastatin 40 Mg Tabs (Simvastatin) .Marland Kitchen... 1 by mouth once daily  Orders: Venipuncture (60454) TLB-Lipid Panel (80061-LIPID) TLB-BMP (Basic Metabolic Panel-BMET) (80048-METABOL) TLB-CBC Platelet - w/Differential (85025-CBCD) TLB-Hepatic/Liver Function Pnl (80076-HEPATIC) TLB-TSH (Thyroid Stimulating Hormone) (84443-TSH) TLB-A1C / Hgb A1C (Glycohemoglobin) (83036-A1C) TLB-PSA (Prostate Specific Antigen) (84153-PSA) TLB-Udip w/ Micro (81001-URINE)  Labs Reviewed: SGOT: 16 (11/02/2009)   SGPT: 24 (11/02/2009)   HDL:28.40 (11/02/2009), 30.8 (08/30/2008)  LDL:106 (11/02/2009), 132 (08/30/2008)  Chol:157 (11/02/2009), 177 (08/30/2008)  Trig:112.0 (11/02/2009), 70 (08/30/2008)  Problem # 7:  HYPERTENSION (ICD-401.9) Assessment: Unchanged  His updated medication list for this problem includes:    Lisinopril 20 Mg Tabs (Lisinopril) .Marland Kitchen... 1 by mouth once daily    Hydrochlorothiazide 25 Mg Tabs (Hydrochlorothiazide) .Marland Kitchen... 1 by mouth once daily  Orders: Venipuncture (09811) TLB-Lipid Panel (80061-LIPID) TLB-BMP (Basic Metabolic Panel-BMET) (80048-METABOL) TLB-CBC Platelet - w/Differential (85025-CBCD) TLB-Hepatic/Liver Function Pnl (80076-HEPATIC) TLB-TSH (Thyroid Stimulating Hormone) (84443-TSH) TLB-A1C / Hgb A1C (Glycohemoglobin) (83036-A1C) TLB-PSA (Prostate Specific Antigen) (84153-PSA) TLB-Udip w/ Micro  (81001-URINE)  BP today: 136/70 Prior BP: 150/94 (11/09/2009)  Labs Reviewed: K+: 4.0 (11/02/2009) Creat: : 0.8 (11/02/2009)   Chol: 157 (11/02/2009)   HDL: 28.40 (11/02/2009)   LDL: 106 (11/02/2009)   TG: 112.0 (11/02/2009)  Problem # 8:  RECTAL BLEEDING (ICD-569.3) Assessment: Improved  Orders: Hemoccult Guaiac-1 spec.(in office) (82270)  Complete Medication List: 1)  Lisinopril 20 Mg Tabs (Lisinopril) .Marland Kitchen.. 1 by mouth once daily 2)  Ecotrin Low Strength 81 Mg Tbec (Aspirin) .Marland Kitchen.. 1 by mouth qd 3)  Vitamin E 1000 Unit Crea (Vitamin e) .Marland Kitchen.. 1 by mouth qd 4)  Motrin Ib 200 Mg Tabs (Ibuprofen) .Marland Kitchen.. 1 by mouth qid prn 5)  Vit .c  .Marland Kitchen.. 1 by mouth qd 6)  Simvastatin 40 Mg Tabs (Simvastatin) .Marland Kitchen.. 1 by mouth once daily 7)  Viagra 100 Mg Tabs (Sildenafil citrate) .Marland Kitchen.. 1 by mouth once daily as needed 8)  Adderall 30 Mg Tabs (Amphetamine-dextroamphetamine) .... 1/2 by mouth three times a day - to fill Mar 03, 2010 9)  Hydrochlorothiazide 25 Mg Tabs (Hydrochlorothiazide) .Marland Kitchen.. 1 by mouth once daily 10)  Klor-con 10 10 Meq Cr-tabs (Potassium chloride) .Marland Kitchen.. 1po once daily 11)  Hydrocodone-acetaminophen 7.5-325 Mg Tabs (Hydrocodone-acetaminophen) .Marland Kitchen.. 1 by mouth four times per day as needed 12)  Avelox Abc Pack 400 Mg Tabs (Moxifloxacin hcl) .... One by mouth once daily for 5 days  Lipid Assessment/Plan:      Based on NCEP/ATP III, the patient's risk factor category is "history of diabetes".  The patient's lipid goals are as follows: Total cholesterol goal is 200; LDL cholesterol goal is 100; HDL cholesterol goal is 40; Triglyceride goal is 150.    Colorectal Screening:  Current Recommendations:    Hemoccult: NEG X 1 today  PSA Screening:    PSA: 2.14  (11/02/2009)    Reviewed PSA screening recommendations: PSA ordered  Immunization & Chemoprophylaxis:    Tetanus vaccine: Tdap  (11/09/2009)  Influenza vaccine: given  (09/02/2009)    Pneumovax: Pneumovax  (11/09/2009)   Patient  Instructions: 1)  Please schedule a follow-up appointment in 2 weeks. 2)  It is important that you exercise regularly at least 20 minutes 5 times a week. If you develop chest pain, have severe difficulty breathing, or feel very tired , stop exercising immediately and seek medical attention. 3)  You need to lose weight. Consider a lower calorie diet and regular exercise.  4)  Check your blood sugars regularly. If your readings are usually above 200 or below 70 you should contact our office. 5)  It is important that your Diabetic A1c level is checked every 3 months. 6)  See your eye doctor yearly to check for diabetic eye damage. 7)  Check your feet each night for sore areas, calluses or signs of infection. 8)  Check your Blood Pressure regularly. If it is above 130/80: you should make an appointment. 9)  Take your antibiotic as prescribed until ALL of it is gone, but stop if you develop a rash or swelling and contact our office as soon as possible. 10)  Acute bronchitis symptoms for less than 10 days are not helped by antibiotics. take over the counter cough medications. call if no improvment in  5-7 days, sooner if increasing cough, fever, or new symptoms( shortness of breath, chest pain). Prescriptions: AVELOX ABC PACK 400 MG TABS (MOXIFLOXACIN HCL) One by mouth once daily for 5 days  #5 x 0   Entered and Authorized by:   Etta Grandchild MD   Signed by:   Etta Grandchild MD on 10/19/2010   Method used:   Samples Given   RxID:   [id number]    Orders Added: 1)  T-2 View CXR [71020TC] 2)  Venipuncture [36415] 3)  TLB-Lipid Panel [80061-LIPID] 4)  TLB-BMP (Basic Metabolic Panel-BMET) [80048-METABOL] 5)  TLB-CBC Platelet - w/Differential [85025-CBCD] 6)  TLB-Hepatic/Liver Function Pnl [80076-HEPATIC] 7)  TLB-TSH (Thyroid Stimulating Hormone) [84443-TSH] 8)  TLB-A1C / Hgb A1C (Glycohemoglobin) [83036-A1C] 9)  TLB-PSA (Prostate Specific Antigen) [84153-PSA] 10)  TLB-Udip w/ Micro  [81001-URINE] 11)  Ophthalmology Referral [Ophthalmology] 12)  Hemoccult Guaiac-1 spec.(in office) [82270] 13)  Est. Patient Level V [56213]

## 2011-01-04 NOTE — Progress Notes (Signed)
Summary: CORRECTION  Phone Note Call from Patient   Summary of Call: Pt called w/correction - Pt was given rx at ER for metformin two times a day. Ok to correct?  Initial call taken by: Lamar Sprinkles, CMA,  October 24, 2010 2:36 PM  Follow-up for Phone Call        yes Follow-up by: Corwin Levins MD,  October 24, 2010 3:28 PM    New/Updated Medications: METFORMIN HCL 500 MG TABS (METFORMIN HCL) 1 tab by mouth two times a day Prescriptions: METFORMIN HCL 500 MG TABS (METFORMIN HCL) 1 tab by mouth two times a day  #60 x 5   Entered by:   Margaret Pyle, CMA   Authorized by:   Corwin Levins MD   Signed by:   Margaret Pyle, CMA on 10/24/2010   Method used:   Electronically to        [facility]* (retail)       [address]       [place], [place]  [id number]       Ph: [phone]       Fax: [phone]   RxID:   [id number]

## 2011-01-05 ENCOUNTER — Encounter (INDEPENDENT_AMBULATORY_CARE_PROVIDER_SITE_OTHER): Payer: Self-pay | Admitting: *Deleted

## 2011-01-10 NOTE — Letter (Signed)
Summary: Referral - not able to see patient  [facility]  [address] [place], [place] [address]   Phone: [phone]  Fax: [phone]    January 05, 2011  Oliver Barre, MD [address] [place], [place] [address]   Re:   Jeremy Abbott DOB:  1945/12/11 MRN:   [id number]    Dear Dr. Jonny Ruiz:  Thank you for your kind referral of the above patient.  We have attempted to schedule the recommended procedure Screening Colonoscopy but have not been able to schedule because:  X   The patient was not available by phone and/or has not returned our calls.  ___ The patient declined to schedule the procedure at this time.  We appreciate the referral and hope that we will have the opportunity to treat this patient in the future.    Sincerely,    [facility] [phone]

## 2011-01-10 NOTE — Medication Information (Signed)
Summary: Prior Auth/[employer]  Prior Auth/[employer]   Imported By: Lester [place] 01/05/2011 07:24:55  _____________________________________________________________________  External Attachment:    Type:   Image     Comment:   External Document

## 2011-01-10 NOTE — Progress Notes (Signed)
Summary: PA-Adderall  Phone Note From Pharmacy   Summary of Call: PA-Adderall, called [employer] @ [phone] awaiting form. Dagoberto Reef  December 26, 2010 2:36 PM Form to Dr Jonny Ruiz to complete. Dagoberto Reef  December 26, 2010 2:41 PM Faxed to [facility] @ [phone], awaiting approval Dagoberto Reef  December 27, 2010 4:52 PM PA Approved 11/24/10 - 11/25/11, pt aware. Initial call taken by: Dagoberto Reef,  January 02, 2011 8:13 AM

## 2011-02-13 LAB — URINALYSIS, ROUTINE W REFLEX MICROSCOPIC
Bilirubin Urine: NEGATIVE
Ketones, ur: 15 mg/dL — AB
Leukocytes, UA: NEGATIVE
Nitrite: NEGATIVE
Specific Gravity, Urine: 1.035 — ABNORMAL HIGH (ref 1.005–1.030)
Urobilinogen, UA: 0.2 mg/dL (ref 0.0–1.0)
pH: 5.5 (ref 5.0–8.0)

## 2011-02-13 LAB — BLOOD GAS, VENOUS
O2 Saturation: 92 %
pO2, Ven: 59.5 mmHg — ABNORMAL HIGH (ref 30.0–45.0)

## 2011-02-13 LAB — GLUCOSE, CAPILLARY
Glucose-Capillary: 451 mg/dL — ABNORMAL HIGH (ref 70–99)
Glucose-Capillary: 580 mg/dL (ref 70–99)

## 2011-02-13 LAB — CBC
HCT: 43.3 % (ref 39.0–52.0)
Hemoglobin: 15.2 g/dL (ref 13.0–17.0)
WBC: 11.5 10*3/uL — ABNORMAL HIGH (ref 4.0–10.5)

## 2011-02-13 LAB — DIFFERENTIAL
Lymphocytes Relative: 12 % (ref 12–46)
Monocytes Absolute: 0.8 10*3/uL (ref 0.1–1.0)
Monocytes Relative: 7 % (ref 3–12)
Neutro Abs: 9 10*3/uL — ABNORMAL HIGH (ref 1.7–7.7)

## 2011-02-13 LAB — URINE MICROSCOPIC-ADD ON

## 2011-02-13 LAB — BASIC METABOLIC PANEL
GFR calc non Af Amer: >60 mL/min (ref >60)
Glucose, Bld: 555 mg/dL (ref 70–99)
Potassium: 4.8 mEq/L (ref 3.5–5.1)
Sodium: 132 mEq/L — ABNORMAL LOW (ref 135–145)

## 2011-05-07 ENCOUNTER — Other Ambulatory Visit: Payer: Self-pay

## 2011-05-07 ENCOUNTER — Other Ambulatory Visit: Payer: Self-pay | Admitting: Internal Medicine

## 2011-05-08 ENCOUNTER — Other Ambulatory Visit (INDEPENDENT_AMBULATORY_CARE_PROVIDER_SITE_OTHER): Payer: Self-pay

## 2011-05-08 DIAGNOSIS — IMO0001 Reserved for inherently not codable concepts without codable children: Secondary | ICD-10-CM

## 2011-05-08 LAB — LIPID PANEL
HDL: 30.8 mg/dL — ABNORMAL LOW (ref >39.00)
LDL Cholesterol: 76 mg/dL (ref 0–99)
Total CHOL/HDL Ratio: 4
Triglycerides: 85 mg/dL (ref 0.0–149.0)

## 2011-05-08 LAB — BASIC METABOLIC PANEL
CO2: 30 mEq/L (ref 19–32)
Chloride: 103 mEq/L (ref 96–112)
Sodium: 140 mEq/L (ref 135–145)

## 2011-05-13 DIAGNOSIS — Z0000 Encounter for general adult medical examination without abnormal findings: Secondary | ICD-10-CM | POA: Insufficient documentation

## 2011-05-14 ENCOUNTER — Encounter: Payer: Self-pay | Admitting: Internal Medicine

## 2011-05-14 ENCOUNTER — Ambulatory Visit (INDEPENDENT_AMBULATORY_CARE_PROVIDER_SITE_OTHER): Payer: Federal, State, Local not specified - PPO | Admitting: Internal Medicine

## 2011-05-14 VITALS — BP 120/68 | HR 87 | Temp 97.6°F | Ht 69.0 in | Wt 220.0 lb

## 2011-05-14 DIAGNOSIS — M25519 Pain in unspecified shoulder: Secondary | ICD-10-CM

## 2011-05-14 DIAGNOSIS — Z0000 Encounter for general adult medical examination without abnormal findings: Secondary | ICD-10-CM

## 2011-05-14 DIAGNOSIS — E119 Type 2 diabetes mellitus without complications: Secondary | ICD-10-CM

## 2011-05-14 DIAGNOSIS — E785 Hyperlipidemia, unspecified: Secondary | ICD-10-CM

## 2011-05-14 DIAGNOSIS — M25512 Pain in left shoulder: Secondary | ICD-10-CM | POA: Insufficient documentation

## 2011-05-14 DIAGNOSIS — I10 Essential (primary) hypertension: Secondary | ICD-10-CM

## 2011-05-14 MED ORDER — METFORMIN HCL ER 500 MG PO TB24: ORAL_TABLET | ORAL | Status: DC

## 2011-05-14 NOTE — Assessment & Plan Note (Signed)
Good control, lost wt, ok to decrease the metformin to 3 per day  Lab Results  Component Value Date   HGBA1C 5.9 05/08/2011

## 2011-05-14 NOTE — Assessment & Plan Note (Signed)
I think most c/w left rotater cuff tendonitis; improved but still active - I urged pt to return to ortho, may need MRI

## 2011-05-14 NOTE — Assessment & Plan Note (Signed)
BP Readings from Last 3 Encounters:  05/14/11 120/68  11/10/10 128/78  10/19/10 136/70

## 2011-05-14 NOTE — Progress Notes (Signed)
Subjective:    Patient ID: Jeremy Abbott, male    DOB: 06-03-1946, 65 y.o.   MRN: [id number]  HPI Here to f/u; overall doing ok,  Pt denies chest pain, increased sob or doe, wheezing, orthopnea, PND, increased LE swelling, palpitations, dizziness or syncope.  Pt denies new neurological symptoms such as new headache, or facial or extremity weakness or numbness   Pt denies polydipsia, polyuria, or low sugar symptoms such as weakness or confusion improved with po intake.  Pt states overall good compliance with meds, trying to follow lower cholesterol, diabetic diet, wt down from 242 in dec 2011 to current 220 with better diet - lower carbs and less calories overall.  Also mentions he wore mailbag on left shoulder for yrs, and for 5 wks has had some discomfort to abduction of the shoulder.  Has seen [facility]/ortho for prior clavicle fx, and did laso have cortisone shot to the left shoulder, pain worse also to lie on the right side as the left shoulder tended to "hang", also had nsaid per ortho but did not take in favor excedrin as his pharmacist said the ASA conflicted with the nsaid.   Past Medical History  Diagnosis Date  . DIABETES MELLITUS, TYPE II 02/19/2008  . HYPERLIPIDEMIA 02/19/2008  . ANXIETY 02/19/2008  . Adjustment disorder with anxiety 08/04/2008  . DEPRESSION 02/19/2008  . ADD 08/04/2008  . HYPERTENSION 02/19/2008  . ALLERGIC RHINITIS 02/19/2008  . FRACTURE, CLAVICLE, RIGHT 11/09/2009    Chronic right collarbone pain-non union  . TACHYCARDIA 10/19/2010  . RECTAL BLEEDING 01/22/2008  . PERIPHERAL EDEMA 02/19/2008  . NSAID long-term use    Past Surgical History  Procedure Date  . Tonsillectomy   . Orif clavicle fracture 2/08    s/p collarbone fx with Will Bonnet    reports that he has quit smoking. He does not have any smokeless tobacco history on file. He reports that he does not drink alcohol or use illicit drugs. family history includes Diabetes in his daughter. No Known  Allergies Current Outpatient Prescriptions on File Prior to Visit  Medication Sig Dispense Refill  . amphetamine-dextroamphetamine (ADDERALL) 30 MG tablet 1/2 tablet by mouth three times a day       . aspirin (ECOTRIN LOW STRENGTH) 81 MG EC tablet Take 81 mg by mouth daily.        Marland Kitchen glucose blood (ONE TOUCH ULTRA TEST) test strip Once daily dx 250.00       . hydrochlorothiazide 25 MG tablet Take 25 mg by mouth daily.        Marland Kitchen HYDROcodone-acetaminophen (NORCO) 7.5-325 MG per tablet Take 1 tablet by mouth 4 (four) times daily as needed.        Marland Kitchen ibuprofen (ADVIL,MOTRIN) 200 MG tablet Take 200 mg by mouth 4 (four) times daily as needed.        Marland Kitchen lisinopril (PRINIVIL,ZESTRIL) 20 MG tablet Take 20 mg by mouth daily.        . metFORMIN (GLUCOPHAGE-XR) 500 MG 24 hr tablet 4 tablets by mouth every morning       . ONETOUCH DELICA LANCETS MISC Once daily dx 250.00       . potassium chloride (KLOR-CON) 10 MEQ CR tablet Take 10 mEq by mouth daily.        . sildenafil (VIAGRA) 100 MG tablet Take 100 mg by mouth daily as needed.        . simvastatin (ZOCOR) 40 MG tablet Take 40 mg by mouth daily.        Marland Kitchen  DISCONTD: vitamin C (ASCORBIC ACID) 500 MG tablet Take 500 mg by mouth daily.        Marland Kitchen DISCONTD: vitamin E 1000 UNIT capsule Take 1,000 Units by mouth daily.         Review of Systems All otherwise neg per pt except concerned about neuropathy as h has had some tingling at night, but no numbness during the day or night o/w     Objective:   Physical Exam BP 120/68  Pulse 87  Temp(Src) 97.6 F (36.4 C) (Oral)  Ht 5\' 9"  (1.753 m)  Wt 220 lb (99.791 kg)  BMI 32.49 kg/m2  SpO2 97% Physical Exam  VS noted Constitutional: Pt appears well-developed and well-nourished.  HENT: Head: Normocephalic.  Right Ear: External ear normal.  Left Ear: External ear normal.  Eyes: Conjunctivae and EOM are normal. Pupils are equal, round, and reactive to light.  Neck: Normal range of motion. Neck supple.    Cardiovascular: Normal rate and regular rhythm.   Pulmonary/Chest: Effort normal and breath sounds normal.  Neurological: Pt is alert. No cranial nerve deficit.  Skin: Skin is warm. No erythema.  Psychiatric: Pt behavior is normal. Thought content normal.  Left shouler with mild pain elicited on abduction to 100 degrees       Assessment & Plan:  earachedm

## 2011-05-14 NOTE — Assessment & Plan Note (Signed)
stable overall by hx and exam, most recent data reviewed with pt, and pt to continue medical treatment as before  Lab Results  Component Value Date   LDLCALC 76 05/08/2011

## 2011-05-14 NOTE — Patient Instructions (Signed)
Decrease the metformin to 3 per day Continue all other medications as before Please return in 6 mo with Lab testing done 3-5 days before

## 2011-07-13 ENCOUNTER — Ambulatory Visit (INDEPENDENT_AMBULATORY_CARE_PROVIDER_SITE_OTHER): Payer: Federal, State, Local not specified - PPO | Admitting: Internal Medicine

## 2011-07-13 ENCOUNTER — Encounter: Payer: Self-pay | Admitting: Internal Medicine

## 2011-07-13 DIAGNOSIS — I10 Essential (primary) hypertension: Secondary | ICD-10-CM

## 2011-07-13 DIAGNOSIS — E119 Type 2 diabetes mellitus without complications: Secondary | ICD-10-CM

## 2011-07-13 DIAGNOSIS — J209 Acute bronchitis, unspecified: Secondary | ICD-10-CM

## 2011-07-13 MED ORDER — HYDROCODONE-HOMATROPINE 5-1.5 MG/5ML PO SYRP: 5.0000 mL | ORAL_SOLUTION | Freq: Four times a day (QID) | ORAL | Status: AC | PRN

## 2011-07-13 MED ORDER — AZITHROMYCIN 250 MG PO TABS: ORAL_TABLET | ORAL | Status: AC

## 2011-07-13 NOTE — Patient Instructions (Signed)
Take all new medications as prescribed Continue all other medications as before You can also take  Mucinex (or it's generic off brand) for congestion  

## 2011-07-14 ENCOUNTER — Encounter: Payer: Self-pay | Admitting: Internal Medicine

## 2011-07-14 DIAGNOSIS — J209 Acute bronchitis, unspecified: Secondary | ICD-10-CM | POA: Insufficient documentation

## 2011-07-14 NOTE — Assessment & Plan Note (Signed)
stable overall by hx and exam, most recent data reviewed with pt, and pt to continue medical treatment as before  BP Readings from Last 3 Encounters:  07/13/11 110/70  05/14/11 120/68  11/10/10 128/78

## 2011-07-14 NOTE — Assessment & Plan Note (Signed)
stable overall by hx and exam, most recent data reviewed with pt, and pt to continue medical treatment as before  Lab Results  Component Value Date   HGBA1C 5.9 05/08/2011   Pt to call with onset polys or cbg > 200

## 2011-07-14 NOTE — Progress Notes (Signed)
Subjective:    Patient ID: Jeremy Abbott, male    DOB: 1946/10/25, 65 y.o.   MRN: [id number]  HPI Here with acute onset mild to mod 2-3 days ST, HA, general weakness and malaise, with prod cough greenish sputum, but Pt denies chest pain, increased sob or doe, wheezing, orthopnea, PND, increased LE swelling, palpitations, dizziness or syncope. Pt denies new neurological symptoms such as new headache, or facial or extremity weakness or numbness   Pt denies polydipsia, polyuria, or low sugar symptoms such as weakness or confusion improved with po intake.  Pt states overall good compliance with meds, trying to follow lower cholesterol, diabetic diet, wt overall stable but little exercise however.    Past Medical History  Diagnosis Date  . DIABETES MELLITUS, TYPE II 02/19/2008  . HYPERLIPIDEMIA 02/19/2008  . ANXIETY 02/19/2008  . Adjustment disorder with anxiety 08/04/2008  . DEPRESSION 02/19/2008  . ADD 08/04/2008  . HYPERTENSION 02/19/2008  . ALLERGIC RHINITIS 02/19/2008  . FRACTURE, CLAVICLE, RIGHT 11/09/2009    Chronic right collarbone pain-non union  . TACHYCARDIA 10/19/2010  . RECTAL BLEEDING 01/22/2008  . PERIPHERAL EDEMA 02/19/2008  . NSAID long-term use    Past Surgical History  Procedure Date  . Tonsillectomy   . Orif clavicle fracture 2/08    s/p collarbone fx with Will Bonnet    reports that he has quit smoking. He does not have any smokeless tobacco history on file. He reports that he does not drink alcohol or use illicit drugs. family history includes Diabetes in his daughter. No Known Allergies Current Outpatient Prescriptions on File Prior to Visit  Medication Sig Dispense Refill  . amphetamine-dextroamphetamine (ADDERALL) 30 MG tablet 1/2 tablet by mouth three times a day       . aspirin (ECOTRIN LOW STRENGTH) 81 MG EC tablet Take 81 mg by mouth daily.        Marland Kitchen glucose blood (ONE TOUCH ULTRA TEST) test strip Once daily dx 250.00       . hydrochlorothiazide 25 MG tablet Take  25 mg by mouth daily.        Marland Kitchen HYDROcodone-acetaminophen (NORCO) 7.5-325 MG per tablet Take 1 tablet by mouth 4 (four) times daily as needed.        Marland Kitchen ibuprofen (ADVIL,MOTRIN) 200 MG tablet Take 200 mg by mouth 4 (four) times daily as needed.        Marland Kitchen lisinopril (PRINIVIL,ZESTRIL) 20 MG tablet Take 20 mg by mouth daily.        . metFORMIN (GLUCOPHAGE-XR) 500 MG 24 hr tablet 3 tablets by mouth every morning  270 tablet  3  . ONETOUCH DELICA LANCETS MISC Once daily dx 250.00       . potassium chloride (KLOR-CON) 10 MEQ CR tablet Take 10 mEq by mouth daily.        . sildenafil (VIAGRA) 100 MG tablet Take 100 mg by mouth daily as needed.        . simvastatin (ZOCOR) 40 MG tablet Take 40 mg by mouth daily.         Review of Systems Review of Systems  Constitutional: Negative for diaphoresis and unexpected weight change.  HENT: Negative for drooling and tinnitus.   Eyes: Negative for photophobia and visual disturbance.  Respiratory: Negative for choking and stridor.   Gastrointestinal: Negative for vomiting and blood in stool.  Genitourinary: Negative for hematuria and decreased urine volume.        Objective:   Physical Exam BP 110/70  Pulse 91  Temp(Src) 98.3 F (36.8 C) (Oral)  Ht 5\' 9"  (1.753 m)  Wt 232 lb 4 oz (105.348 kg)  BMI 34.30 kg/m2  SpO2 95% Physical Exam  VS noted, obese, mild ill Constitutional: Pt appears well-developed and well-nourished.  HENT: Head: Normocephalic.  Right Ear: External ear normal.  Left Ear: External ear normal.  Bilat tm's mild erythema.  Sinus nontender.  Pharynx mild erythema Eyes: Conjunctivae and EOM are normal. Pupils are equal, round, and reactive to light.  Neck: Normal range of motion. Neck supple.  Cardiovascular: Normal rate and regular rhythm.   Pulmonary/Chest: Effort normal and breath sounds normal.  Neurological: Pt is alert. No cranial nerve deficit.  Skin: Skin is warm. No erythema.  Psychiatric: Pt behavior is normal. 1+ nervous          Assessment & Plan:

## 2011-07-14 NOTE — Assessment & Plan Note (Signed)
Mild to mod, for antibx course,  to f/u any worsening symptoms or concerns 

## 2011-11-07 ENCOUNTER — Other Ambulatory Visit (INDEPENDENT_AMBULATORY_CARE_PROVIDER_SITE_OTHER): Payer: Federal, State, Local not specified - PPO

## 2011-11-07 DIAGNOSIS — E119 Type 2 diabetes mellitus without complications: Secondary | ICD-10-CM

## 2011-11-07 DIAGNOSIS — R972 Elevated prostate specific antigen [PSA]: Secondary | ICD-10-CM

## 2011-11-07 DIAGNOSIS — E785 Hyperlipidemia, unspecified: Secondary | ICD-10-CM

## 2011-11-07 DIAGNOSIS — Z0000 Encounter for general adult medical examination without abnormal findings: Secondary | ICD-10-CM

## 2011-11-07 DIAGNOSIS — I10 Essential (primary) hypertension: Secondary | ICD-10-CM

## 2011-11-07 LAB — CBC WITH DIFFERENTIAL/PLATELET
Basophils Relative: 0.5 % (ref 0.0–3.0)
Eosinophils Absolute: 0.6 10*3/uL (ref 0.0–0.7)
HCT: 41 % (ref 39.0–52.0)
Lymphs Abs: 2.6 10*3/uL (ref 0.7–4.0)
MCHC: 34.2 g/dL (ref 30.0–36.0)
MCV: 85.7 fl (ref 78.0–100.0)
Monocytes Absolute: 0.9 10*3/uL (ref 0.1–1.0)
Neutro Abs: 8.2 10*3/uL — ABNORMAL HIGH (ref 1.4–7.7)
Neutrophils Relative %: 66 % (ref 43.0–77.0)
RBC: 4.79 Mil/uL (ref 4.22–5.81)

## 2011-11-07 LAB — URINALYSIS, ROUTINE W REFLEX MICROSCOPIC
Ketones, ur: NEGATIVE
Leukocytes, UA: NEGATIVE
Nitrite: NEGATIVE
Specific Gravity, Urine: <=1.005 (ref 1.000–1.030)
Urobilinogen, UA: 0.2 (ref 0.0–1.0)
pH: 6 (ref 5.0–8.0)

## 2011-11-07 LAB — LIPID PANEL
LDL Cholesterol: 83 mg/dL (ref 0–99)
Total CHOL/HDL Ratio: 5
Triglycerides: 107 mg/dL (ref 0.0–149.0)

## 2011-11-07 LAB — BASIC METABOLIC PANEL
Chloride: 100 mEq/L (ref 96–112)
Creatinine, Ser: 0.9 mg/dL (ref 0.4–1.5)
GFR: 94.78 mL/min (ref >60.00)
Potassium: 3.8 mEq/L (ref 3.5–5.1)

## 2011-11-07 LAB — HEPATIC FUNCTION PANEL
ALT: 28 U/L (ref 0–53)
Bilirubin, Direct: 0.1 mg/dL (ref 0.0–0.3)
Total Bilirubin: 0.8 mg/dL (ref 0.3–1.2)

## 2011-11-07 LAB — MICROALBUMIN / CREATININE URINE RATIO: Microalb, Ur: 0.2 mg/dL (ref 0.0–1.9)

## 2011-11-12 ENCOUNTER — Encounter: Payer: Self-pay | Admitting: Internal Medicine

## 2011-11-12 ENCOUNTER — Ambulatory Visit (INDEPENDENT_AMBULATORY_CARE_PROVIDER_SITE_OTHER): Payer: Federal, State, Local not specified - PPO | Admitting: Internal Medicine

## 2011-11-12 VITALS — BP 112/62 | HR 80 | Temp 98.1°F | Ht 69.5 in | Wt 238.4 lb

## 2011-11-12 DIAGNOSIS — E119 Type 2 diabetes mellitus without complications: Secondary | ICD-10-CM

## 2011-11-12 DIAGNOSIS — I10 Essential (primary) hypertension: Secondary | ICD-10-CM

## 2011-11-12 DIAGNOSIS — R972 Elevated prostate specific antigen [PSA]: Secondary | ICD-10-CM

## 2011-11-12 DIAGNOSIS — E785 Hyperlipidemia, unspecified: Secondary | ICD-10-CM

## 2011-11-12 DIAGNOSIS — J449 Chronic obstructive pulmonary disease, unspecified: Secondary | ICD-10-CM | POA: Insufficient documentation

## 2011-11-12 DIAGNOSIS — Z23 Encounter for immunization: Secondary | ICD-10-CM

## 2011-11-12 DIAGNOSIS — Z0000 Encounter for general adult medical examination without abnormal findings: Secondary | ICD-10-CM

## 2011-11-12 MED ORDER — AMPHETAMINE-DEXTROAMPHETAMINE 30 MG PO TABS: 15.0000 mg | ORAL_TABLET | Freq: Three times a day (TID) | ORAL | Status: DC

## 2011-11-12 MED ORDER — HYDROCODONE-ACETAMINOPHEN 7.5-325 MG PO TABS: 1.0000 | ORAL_TABLET | Freq: Four times a day (QID) | ORAL | Status: DC | PRN

## 2011-11-12 NOTE — Assessment & Plan Note (Signed)
Somewhat up and down over past 4 yrs; for f/u psa in 6 mo, consider urology

## 2011-11-12 NOTE — Assessment & Plan Note (Addendum)
Overall doing well, age appropriate education and counseling updated, referrals for preventative services and immunizations addressed, dietary and smoking counseling addressed, most recent labs and ECG reviewed.  I have personally reviewed and have noted: 1) the patient's medical and social history 2) The pt's use of alcohol, tobacco, and illicit drugs 3) The patient's current medications and supplements 4) Functional ability including ADL's, fall risk, home safety risk, hearing and visual impairment 5) Diet and physical activities 6) Evidence for depression or mood disorder 7) The patient's height, weight, and BMI have been recorded in the chart I have made referrals, and provided counseling and education based on review of the above ECG reviewed as per emr Also for colonoscopy as he is due

## 2011-11-12 NOTE — Progress Notes (Signed)
Subjective:    Patient ID: Jeremy Abbott, male    DOB: May 21, 1946, 65 y.o.   MRN: [id number]  HPI  Here for wellness and f/u;  Overall doing ok;  Pt denies CP, worsening SOB, DOE, wheezing, orthopnea, PND, worsening LE edema, palpitations, dizziness or syncope.  Pt denies neurological change such as new Headache, facial or extremity weakness.  Pt denies polydipsia, polyuria, or low sugar symptoms. Pt states overall good compliance with treatment and medications, good tolerability, and trying to follow lower cholesterol diet.  Pt denies worsening depressive symptoms, suicidal ideation or panic. No fever, wt loss, night sweats, loss of appetite, or other constitutional symptoms.  Pt states good ability with ADL's, low fall risk, home safety reviewed and adequate, no significant changes in hearing or vision, and occasionally active with exercise.  Has some tingling to feet occas at night, irritating type of pins and needles occas, without pain, occurs only at night, gone the next day. Past Medical History  Diagnosis Date  . DIABETES MELLITUS, TYPE II 02/19/2008  . HYPERLIPIDEMIA 02/19/2008  . ANXIETY 02/19/2008  . Adjustment disorder with anxiety 08/04/2008  . DEPRESSION 02/19/2008  . ADD 08/04/2008  . HYPERTENSION 02/19/2008  . ALLERGIC RHINITIS 02/19/2008  . FRACTURE, CLAVICLE, RIGHT 11/09/2009    Chronic right collarbone pain-non union  . TACHYCARDIA 10/19/2010  . RECTAL BLEEDING 01/22/2008  . PERIPHERAL EDEMA 02/19/2008  . NSAID long-term use   . COPD (chronic obstructive pulmonary disease) 11/12/2011   Past Surgical History  Procedure Date  . Tonsillectomy   . Orif clavicle fracture 2/08    s/p collarbone fx with Will Bonnet    reports that he has quit smoking. He does not have any smokeless tobacco history on file. He reports that he does not drink alcohol or use illicit drugs. family history includes Diabetes in his daughter. No Known Allergies Current Outpatient Prescriptions on File  Prior to Visit  Medication Sig Dispense Refill  . amphetamine-dextroamphetamine (ADDERALL) 30 MG tablet 1/2 tablet by mouth three times a day       . aspirin (ECOTRIN LOW STRENGTH) 81 MG EC tablet Take 81 mg by mouth daily.        Marland Kitchen glucose blood (ONE TOUCH ULTRA TEST) test strip Once daily dx 250.00       . hydrochlorothiazide 25 MG tablet Take 25 mg by mouth daily.        Marland Kitchen HYDROcodone-acetaminophen (NORCO) 7.5-325 MG per tablet Take 1 tablet by mouth 4 (four) times daily as needed.        Marland Kitchen ibuprofen (ADVIL,MOTRIN) 200 MG tablet Take 200 mg by mouth 4 (four) times daily as needed.        Marland Kitchen lisinopril (PRINIVIL,ZESTRIL) 20 MG tablet Take 20 mg by mouth daily.        . metFORMIN (GLUCOPHAGE-XR) 500 MG 24 hr tablet 3 tablets by mouth every morning  270 tablet  3  . ONETOUCH DELICA LANCETS MISC Once daily dx 250.00       . potassium chloride (KLOR-CON) 10 MEQ CR tablet Take 10 mEq by mouth daily.        . sildenafil (VIAGRA) 100 MG tablet Take 100 mg by mouth daily as needed.        . simvastatin (ZOCOR) 40 MG tablet Take 40 mg by mouth daily.         Review of Systems Review of Systems  Constitutional: Negative for diaphoresis, activity change, appetite change and unexpected weight change.  HENT: Negative for hearing loss, ear pain, facial swelling, mouth sores and neck stiffness.   Eyes: Negative for pain, redness and visual disturbance.  Respiratory: Negative for shortness of breath and wheezing.   Cardiovascular: Negative for chest pain and palpitations.  Gastrointestinal: Negative for diarrhea, blood in stool, abdominal distention and rectal pain.  Genitourinary: Negative for hematuria, flank pain and decreased urine volume.  Musculoskeletal: Negative for myalgias and joint swelling.  Skin: Negative for color change and wound.  Neurological: Negative for syncope and numbness.  Hematological: Negative for adenopathy.  Psychiatric/Behavioral: Negative for hallucinations, self-injury,  decreased concentration and agitation.      Objective:   Physical Exam BP 112/62  Pulse 80  Temp(Src) 98.1 F (36.7 C) (Oral)  Ht 5' 9.5" (1.765 m)  Wt 238 lb 6 oz (108.126 kg)  BMI 34.70 kg/m2  SpO2 97% Physical Exam  VS noted Constitutional: Pt is oriented to person, place, and time. Appears well-developed and well-nourished.  HENT:  Head: Normocephalic and atraumatic.  Right Ear: External ear normal.  Left Ear: External ear normal.  Nose: Nose normal.  Mouth/Throat: Oropharynx is clear and moist.  Eyes: Conjunctivae and EOM are normal. Pupils are equal, round, and reactive to light.  Neck: Normal range of motion. Neck supple. No JVD present. No tracheal deviation present.  Cardiovascular: Normal rate, regular rhythm, normal heart sounds and intact distal pulses.   Pulmonary/Chest: Effort normal and breath sounds normal.  Abdominal: Soft. Bowel sounds are normal. There is no tenderness.  Musculoskeletal: Normal range of motion. Exhibits no edema.  Lymphadenopathy:  Has no cervical adenopathy.  Neurological: Pt is alert and oriented to person, place, and time. Pt has normal reflexes. No cranial nerve deficit.  Skin: Skin is warm and dry. No rash noted.  Psychiatric:  Has  normal mood and affect. Behavior is normal.     Assessment & Plan:

## 2011-11-12 NOTE — Progress Notes (Signed)
Addended by: Scharlene Gloss B on: 11/12/2011 09:53 AM   Modules accepted: Orders

## 2011-11-12 NOTE — Patient Instructions (Addendum)
Please check with your insurance company regarding the shingles shot, and return for Nurse Visit for the shot if covered You will be contacted regarding the referral for: colonscopy You are otherwise up to date with prevention You are given the harcopy of the pain med and adderall Please have the pharmacy call when you need refills Please return in 6 mo with Lab testing done 3-5 days before

## 2011-11-12 NOTE — Assessment & Plan Note (Signed)
stable overall by hx and exam, most recent data reviewed with pt, and pt to continue medical treatment as before  Lab Results  Component Value Date   LDLCALC 83 11/07/2011

## 2011-11-12 NOTE — Assessment & Plan Note (Signed)
stable overall by hx and exam, most recent data reviewed with pt, and pt to continue medical treatment as before;  Lab Results  Component Value Date   HGBA1C 6.0 11/07/2011

## 2011-11-12 NOTE — Assessment & Plan Note (Signed)
stable overall by hx and exam, most recent data reviewed with pt, and pt to continue medical treatment as before  BP Readings from Last 3 Encounters:  11/12/11 112/62  07/13/11 110/70  05/14/11 120/68

## 2012-01-21 ENCOUNTER — Encounter: Payer: Self-pay | Admitting: Gastroenterology

## 2012-02-25 ENCOUNTER — Other Ambulatory Visit: Payer: Self-pay | Admitting: Internal Medicine

## 2012-03-28 ENCOUNTER — Other Ambulatory Visit: Payer: Self-pay | Admitting: Internal Medicine

## 2012-03-31 ENCOUNTER — Telehealth: Payer: Self-pay

## 2012-03-31 NOTE — Telephone Encounter (Signed)
Called [phone] [employer] to initiate PA for Adderall.  Approval was received from 01/31/2012 through 02/28/2013.  Called [facility] to inform and they will contact the patient.

## 2012-05-09 ENCOUNTER — Other Ambulatory Visit (INDEPENDENT_AMBULATORY_CARE_PROVIDER_SITE_OTHER): Payer: Medicare Other

## 2012-05-09 DIAGNOSIS — E119 Type 2 diabetes mellitus without complications: Secondary | ICD-10-CM

## 2012-05-09 DIAGNOSIS — R972 Elevated prostate specific antigen [PSA]: Secondary | ICD-10-CM

## 2012-05-09 LAB — LIPID PANEL
LDL Cholesterol: 74 mg/dL (ref 0–99)
VLDL: 18 mg/dL (ref 0.0–40.0)

## 2012-05-09 LAB — BASIC METABOLIC PANEL
Calcium: 9.1 mg/dL (ref 8.4–10.5)
Creatinine, Ser: 0.7 mg/dL (ref 0.4–1.5)
GFR: 126.23 mL/min (ref >60.00)
Sodium: 140 mEq/L (ref 135–145)

## 2012-05-12 ENCOUNTER — Telehealth: Payer: Self-pay | Admitting: Internal Medicine

## 2012-05-12 ENCOUNTER — Encounter: Payer: Self-pay | Admitting: Internal Medicine

## 2012-05-12 ENCOUNTER — Ambulatory Visit (INDEPENDENT_AMBULATORY_CARE_PROVIDER_SITE_OTHER): Payer: Federal, State, Local not specified - PPO | Admitting: Internal Medicine

## 2012-05-12 VITALS — BP 106/72 | HR 98 | Temp 98.6°F | Resp 16 | Wt 229.0 lb

## 2012-05-12 DIAGNOSIS — R202 Paresthesia of skin: Secondary | ICD-10-CM

## 2012-05-12 DIAGNOSIS — Z0000 Encounter for general adult medical examination without abnormal findings: Secondary | ICD-10-CM

## 2012-05-12 DIAGNOSIS — R209 Unspecified disturbances of skin sensation: Secondary | ICD-10-CM

## 2012-05-12 DIAGNOSIS — R609 Edema, unspecified: Secondary | ICD-10-CM

## 2012-05-12 DIAGNOSIS — E119 Type 2 diabetes mellitus without complications: Secondary | ICD-10-CM

## 2012-05-12 DIAGNOSIS — J449 Chronic obstructive pulmonary disease, unspecified: Secondary | ICD-10-CM

## 2012-05-12 DIAGNOSIS — R972 Elevated prostate specific antigen [PSA]: Secondary | ICD-10-CM

## 2012-05-12 DIAGNOSIS — E785 Hyperlipidemia, unspecified: Secondary | ICD-10-CM

## 2012-05-12 MED ORDER — AMITRIPTYLINE HCL 50 MG PO TABS: 50.0000 mg | ORAL_TABLET | Freq: Every evening | ORAL | Status: DC | PRN

## 2012-05-12 NOTE — Assessment & Plan Note (Signed)
stable overall by hx and exam, most recent data reviewed with pt, and pt to continue medical treatment as before Lab Results  Component Value Date   LDLCALC 74 05/09/2012

## 2012-05-12 NOTE — Assessment & Plan Note (Signed)
stable overall by hx and exam, most recent data reviewed with pt, and pt to continue medical treatment as before SpO2 Readings from Last 3 Encounters:  05/12/12 95%  11/12/11 97%  07/13/11 95%

## 2012-05-12 NOTE — Progress Notes (Signed)
  Subjective:    Patient ID: Jeremy Abbott, male    DOB: [birth year], 66 y.o.   MRN: [id number]  HPI  Here to f/u; overall doing ok,  Pt denies chest pain, increased sob or doe, wheezing, orthopnea, PND, increased LE swelling, palpitations, dizziness or syncope.  Pt denies new neurological symptoms such as new headache, or facial or extremity weakness or numbness   Pt denies polydipsia, polyuria, or low sugar symptoms such as weakness or confusion improved with po intake.  Pt states overall good compliance with meds, trying to follow lower cholesterol, diabetic diet, wt overall stable but little exercise however.   Denies urinary symptoms such as dysuria, frequency, urgency,or hematuria. Still tyring to lose wt and has had some success.  Does have some pins and needles to feet at night only Past Medical History  Diagnosis Date  . DIABETES MELLITUS, TYPE II 02/19/2008  . HYPERLIPIDEMIA 02/19/2008  . ANXIETY 02/19/2008  . Adjustment disorder with anxiety 08/04/2008  . DEPRESSION 02/19/2008  . ADD 08/04/2008  . HYPERTENSION 02/19/2008  . ALLERGIC RHINITIS 02/19/2008  . FRACTURE, CLAVICLE, RIGHT 11/09/2009    Chronic right collarbone pain-non union  . TACHYCARDIA 10/19/2010  . RECTAL BLEEDING 01/22/2008  . PERIPHERAL EDEMA 02/19/2008  . NSAID long-term use   . COPD (chronic obstructive pulmonary disease) 11/12/2011   Past Surgical History  Procedure Date  . Tonsillectomy   . Orif clavicle fracture 2/08    s/p collarbone fx with Will Bonnet    reports that he has quit smoking. He does not have any smokeless tobacco history on file. He reports that he does not drink alcohol or use illicit drugs. family history includes Diabetes in his daughter. No Known Allergies  Review of Systems Review of Systems  Constitutional: Negative for diaphoresis and unexpected weight change.  HENT: Negative for  tinnitus.   Eyes: Negative for photophobia and visual disturbance.  Respiratory: Negative for choking  and stridor.   Gastrointestinal: Negative for vomiting and blood in stool.  Genitourinary: Negative for hematuria and decreased urine volume.  Musculoskeletal: Negative for gait problem.  Skin: Negative for color change and wound.  Neurological: Negative for tremors and numbness.     Objective:   Physical Exam BP 106/72  Pulse 98  Temp(Src) 98.6 F (37 C) (Oral)  Resp 16  Wt 229 lb (103.874 kg)  SpO2 95% Physical Exam  VS noted Constitutional: Pt appears well-developed and well-nourished.  HENT: Head: Normocephalic.  Right Ear: External ear normal.  Left Ear: External ear normal.  Eyes: Conjunctivae and EOM are normal. Pupils are equal, round, and reactive to light.  Neck: Normal range of motion. Neck supple.  Cardiovascular: Normal rate and regular rhythm.   Pulmonary/Chest: Effort normal and breath sounds normal.  Neurological: Pt is alert. No cranial nerve deficit.  Skin: Skin is warm. No erythema. trace edema bilat right > left Psychiatric: Pt behavior is normal. Thought content normal. 1+ nervous    Assessment & Plan:

## 2012-05-12 NOTE — Assessment & Plan Note (Addendum)
mild today, stable overall by hx and exam, most recent data reviewed with pt, and pt to continue medical treatment as before Lab Results  Component Value Date   WBC 12.4* 11/07/2011   HGB 14.0 11/07/2011   HCT 41.0 11/07/2011   PLT 188.0 11/07/2011   GLUCOSE 114* 05/09/2012   CHOL 120 05/09/2012   TRIG 90.0 05/09/2012   HDL 27.70* 05/09/2012   LDLDIRECT 102.9 10/19/2010   LDLCALC 74 05/09/2012   ALT 28 11/07/2011   AST 21 11/07/2011   NA 140 05/09/2012   K 3.9 05/09/2012   CL 106 05/09/2012   CREATININE 0.7 05/09/2012   BUN 15 05/09/2012   CO2 29 05/09/2012   TSH 1.94 11/07/2011   PSA 2.99 05/09/2012   HGBA1C 6.0 05/09/2012   MICROALBUR 0.2 11/07/2011

## 2012-05-12 NOTE — Patient Instructions (Addendum)
Take all new medications as prescribed - the generic elavil for discomfort at night Continue all other medications as before Please have the pharmacy call with any refills you may need. Please continue your efforts at being more active, low cholesterol diet, and weight control. Please return in 6 mo with Lab testing done 3-5 days before

## 2012-05-12 NOTE — Telephone Encounter (Signed)
I am not sure, except it may have been an diagnosis on a cxr at some point, and was added to his problem list for completeness

## 2012-05-12 NOTE — Assessment & Plan Note (Signed)
stable overall by hx and exam, most recent data reviewed with pt, and pt to continue medical treatment as before Lab Results  Component Value Date   HGBA1C 6.0 05/09/2012

## 2012-05-12 NOTE — Assessment & Plan Note (Signed)
stable overall by hx and exam, most recent data reviewed with pt, and pt to continue medical treatment as before Lab Results  Component Value Date   PSA 2.99 05/09/2012   PSA 2.89 11/07/2011   PSA 1.82 10/19/2010

## 2012-05-12 NOTE — Assessment & Plan Note (Addendum)
Likely DM related discomfort at night - for elavil qhs prn

## 2012-05-12 NOTE — Telephone Encounter (Signed)
Pt wants to know why COPD is on his paperwork from today, request call back

## 2012-05-13 NOTE — Telephone Encounter (Signed)
Called the patient left message to call back 

## 2012-05-14 NOTE — Telephone Encounter (Signed)
Copd removed from problem list per pt request

## 2012-05-14 NOTE — Telephone Encounter (Signed)
Patient informed COPD removed from problem list.

## 2012-05-14 NOTE — Telephone Encounter (Signed)
The patient is concerned that this diagnosis has been entered and could effect insurance in the future.  He would like the diagnosis to be removed if not an actual problem. Informed chest xray 10/29/10 made mention of it.  Please advise as the patient is concerned and does not want it to stay on his records if not a problem.

## 2012-05-14 NOTE — Telephone Encounter (Signed)
Called left message to call back 

## 2012-06-09 ENCOUNTER — Telehealth: Payer: Self-pay

## 2012-06-09 MED ORDER — AMPHETAMINE-DEXTROAMPHETAMINE 30 MG PO TABS: 15.0000 mg | ORAL_TABLET | Freq: Three times a day (TID) | ORAL | Status: DC

## 2012-06-09 MED ORDER — HYDROCODONE-ACETAMINOPHEN 7.5-325 MG PO TABS: 1.0000 | ORAL_TABLET | Freq: Four times a day (QID) | ORAL | Status: DC | PRN

## 2012-06-09 NOTE — Telephone Encounter (Signed)
Patient walked in to request refill on Hydrocodone 7.5-325 with current date (rx dated 11/12/11 on MD's desk).  Patient also would like a 3 MONTH supply of Adderall. Call back number when ready [phone]

## 2012-06-09 NOTE — Telephone Encounter (Signed)
Done hardcopy to robin  

## 2012-06-09 NOTE — Telephone Encounter (Signed)
Called the patient left detailed message that prescriptions requested are ready for pickup at the front desk. 

## 2012-11-10 ENCOUNTER — Other Ambulatory Visit (INDEPENDENT_AMBULATORY_CARE_PROVIDER_SITE_OTHER): Payer: Federal, State, Local not specified - PPO

## 2012-11-10 DIAGNOSIS — Z0000 Encounter for general adult medical examination without abnormal findings: Secondary | ICD-10-CM

## 2012-11-10 DIAGNOSIS — E119 Type 2 diabetes mellitus without complications: Secondary | ICD-10-CM

## 2012-11-10 LAB — CBC WITH DIFFERENTIAL/PLATELET
Basophils Relative: 0.8 % (ref 0.0–3.0)
Eosinophils Absolute: 0.6 10*3/uL (ref 0.0–0.7)
Eosinophils Relative: 5.4 % — ABNORMAL HIGH (ref 0.0–5.0)
HCT: 42.5 % (ref 39.0–52.0)
Hemoglobin: 14.1 g/dL (ref 13.0–17.0)
Lymphs Abs: 2.8 10*3/uL (ref 0.7–4.0)
MCHC: 33.3 g/dL (ref 30.0–36.0)
MCV: 86 fl (ref 78.0–100.0)
Monocytes Absolute: 0.9 10*3/uL (ref 0.1–1.0)
Neutro Abs: 6.1 10*3/uL (ref 1.4–7.7)
RBC: 4.94 Mil/uL (ref 4.22–5.81)
WBC: 10.3 10*3/uL (ref 4.5–10.5)

## 2012-11-10 LAB — URINALYSIS, ROUTINE W REFLEX MICROSCOPIC
Leukocytes, UA: NEGATIVE
Nitrite: NEGATIVE
Specific Gravity, Urine: <=1.005 (ref 1.000–1.030)
pH: 7 (ref 5.0–8.0)

## 2012-11-10 LAB — PSA: PSA: 2.75 ng/mL (ref 0.10–4.00)

## 2012-11-10 LAB — BASIC METABOLIC PANEL
BUN: 15 mg/dL (ref 6–23)
Chloride: 99 mEq/L (ref 96–112)
Creatinine, Ser: 0.7 mg/dL (ref 0.4–1.5)
GFR: 121.83 mL/min (ref >60.00)
Potassium: 4.1 mEq/L (ref 3.5–5.1)

## 2012-11-10 LAB — LIPID PANEL
Cholesterol: 163 mg/dL (ref 0–200)
LDL Cholesterol: 104 mg/dL — ABNORMAL HIGH (ref 0–99)
Total CHOL/HDL Ratio: 5
Triglycerides: 135 mg/dL (ref 0.0–149.0)
VLDL: 27 mg/dL (ref 0.0–40.0)

## 2012-11-10 LAB — HEMOGLOBIN A1C: Hgb A1c MFr Bld: 6.1 % (ref 4.6–6.5)

## 2012-11-10 LAB — HEPATIC FUNCTION PANEL
Bilirubin, Direct: 0.1 mg/dL (ref 0.0–0.3)
Total Bilirubin: 0.6 mg/dL (ref 0.3–1.2)

## 2012-11-10 LAB — MICROALBUMIN / CREATININE URINE RATIO: Microalb, Ur: 0.2 mg/dL (ref 0.0–1.9)

## 2012-11-13 ENCOUNTER — Encounter: Payer: Self-pay | Admitting: Internal Medicine

## 2012-11-13 ENCOUNTER — Ambulatory Visit (INDEPENDENT_AMBULATORY_CARE_PROVIDER_SITE_OTHER): Payer: Federal, State, Local not specified - PPO | Admitting: Internal Medicine

## 2012-11-13 ENCOUNTER — Telehealth: Payer: Self-pay

## 2012-11-13 VITALS — BP 106/68 | HR 91 | Temp 97.5°F | Ht 69.5 in | Wt 223.2 lb

## 2012-11-13 DIAGNOSIS — E119 Type 2 diabetes mellitus without complications: Secondary | ICD-10-CM

## 2012-11-13 DIAGNOSIS — Z0000 Encounter for general adult medical examination without abnormal findings: Secondary | ICD-10-CM

## 2012-11-13 MED ORDER — GLUCOSE BLOOD VI STRP: ORAL_STRIP | Status: DC

## 2012-11-13 MED ORDER — HYDROCODONE-ACETAMINOPHEN 7.5-325 MG PO TABS: 1.0000 | ORAL_TABLET | Freq: Four times a day (QID) | ORAL | Status: DC | PRN

## 2012-11-13 MED ORDER — HYDROCHLOROTHIAZIDE 25 MG PO TABS: 25.0000 mg | ORAL_TABLET | Freq: Every day | ORAL | Status: DC

## 2012-11-13 MED ORDER — ONETOUCH DELICA LANCETS MISC: Status: DC

## 2012-11-13 MED ORDER — AMPHETAMINE-DEXTROAMPHETAMINE 30 MG PO TABS: 15.0000 mg | ORAL_TABLET | Freq: Three times a day (TID) | ORAL | Status: DC

## 2012-11-13 MED ORDER — AMITRIPTYLINE HCL 50 MG PO TABS: 50.0000 mg | ORAL_TABLET | Freq: Every evening | ORAL | Status: DC | PRN

## 2012-11-13 MED ORDER — METFORMIN HCL ER 500 MG PO TB24: ORAL_TABLET | ORAL | Status: DC

## 2012-11-13 MED ORDER — LISINOPRIL 20 MG PO TABS: 20.0000 mg | ORAL_TABLET | Freq: Every day | ORAL | Status: DC

## 2012-11-13 MED ORDER — SILDENAFIL CITRATE 100 MG PO TABS: 100.0000 mg | ORAL_TABLET | Freq: Every day | ORAL | Status: DC | PRN

## 2012-11-13 MED ORDER — SIMVASTATIN 40 MG PO TABS: 40.0000 mg | ORAL_TABLET | Freq: Every day | ORAL | Status: DC

## 2012-11-13 NOTE — Patient Instructions (Addendum)
Continue all other medications as before, except ok to decrease the metformin to 2 pills per day All of your refills were done today Please have the pharmacy call with any other refills you may need. Please continue your efforts at being more active, low cholesterol diet, and weight control. You will be contacted regarding the referral for: colonoscopy You are otherwise up to date with prevention Thank you for enrolling in MyChart. Please follow the instructions below to securely access your online medical record. MyChart allows you to send messages to your doctor, view your test results, renew your prescriptions, schedule appointments, and more. To Log into MyChart, please go to [email or web address], and your Username is: [email or web address] Please return in 6 mo with Lab testing done 3-5 days before

## 2012-11-13 NOTE — Telephone Encounter (Signed)
Message copied by Pincus Sanes on Thu Nov 13, 2012  2:20 PM ------      Message from: Corwin Levins      Created: Thu Nov 13, 2012 11:24 AM       To re-check the sugar       ----- Message -----         From: Vladimir Crofts Sinthia Karabin         Sent: 11/13/2012   9:52 AM           To: Corwin Levins, MD            This patient would like to know  WHY he needs to return in 6 months??

## 2012-11-13 NOTE — Progress Notes (Signed)
Subjective:    Patient ID: Jeremy Abbott, male    DOB: 1946/06/13, 66 y.o.   MRN: [id number]  HPI  Here for wellness and f/u;  Overall doing ok;  Pt denies CP, worsening SOB, DOE, wheezing, orthopnea, PND, worsening LE edema, palpitations, dizziness or syncope.  Pt denies neurological change such as new Headache, facial or extremity weakness.  Pt denies polydipsia, polyuria, or low sugar symptoms. Pt states overall good compliance with treatment and medications, good tolerability, and trying to follow lower cholesterol diet.  Pt denies worsening depressive symptoms, suicidal ideation or panic. No fever, wt loss, night sweats, loss of appetite, or other constitutional symptoms.  Pt states good ability with ADL's, low fall risk, home safety reviewed and adequate, no significant changes in hearing or vision, and occasionally active with exercise, has been able to lose 5 lbs. Past Medical History  Diagnosis Date  . DIABETES MELLITUS, TYPE II 02/19/2008  . HYPERLIPIDEMIA 02/19/2008  . ANXIETY 02/19/2008  . Adjustment disorder with anxiety 08/04/2008  . DEPRESSION 02/19/2008  . ADD 08/04/2008  . HYPERTENSION 02/19/2008  . ALLERGIC RHINITIS 02/19/2008  . FRACTURE, CLAVICLE, RIGHT 11/09/2009    Chronic right collarbone pain-non union  . TACHYCARDIA 10/19/2010  . RECTAL BLEEDING 01/22/2008  . PERIPHERAL EDEMA 02/19/2008  . NSAID long-term use   . COPD (chronic obstructive pulmonary disease) 11/12/2011   Past Surgical History  Procedure Date  . Tonsillectomy   . Orif clavicle fracture 2/08    s/p collarbone fx with Will Bonnet    reports that he has quit smoking. He does not have any smokeless tobacco history on file. He reports that he does not drink alcohol or use illicit drugs. family history includes Diabetes in his daughter. No Known Allergies Current Outpatient Prescriptions on File Prior to Visit  Medication Sig Dispense Refill  . amitriptyline (ELAVIL) 50 MG tablet Take 1 tablet (50 mg  total) by mouth at bedtime as needed for pain.  90 tablet  1  . amphetamine-dextroamphetamine (ADDERALL) 30 MG tablet Take 0.5 tablets (15 mg total) by mouth 3 (three) times daily. 1/2 tablet by mouth three times a day - to fill sept 6, 2013  45 tablet  0  . aspirin (ECOTRIN LOW STRENGTH) 81 MG EC tablet Take 81 mg by mouth daily.        Marland Kitchen GLUCOPHAGE XR 500 MG 24 hr tablet TAKE 4 TABLETS EVERY AM.  360 each  3  . glucose blood (ONE TOUCH ULTRA TEST) test strip Once daily dx 250.00       . hydrochlorothiazide (HYDRODIURIL) 25 MG tablet TAKE 1 TABLET ONCE DAILY.  90 tablet  3  . HYDROcodone-acetaminophen (NORCO) 7.5-325 MG per tablet Take 1 tablet by mouth 4 (four) times daily as needed.  120 tablet  2  . ibuprofen (ADVIL,MOTRIN) 200 MG tablet Take 200 mg by mouth 4 (four) times daily as needed.        Marland Kitchen lisinopril (PRINIVIL,ZESTRIL) 20 MG tablet TAKE 1 TABLET ONCE DAILY.  90 tablet  3  . ONETOUCH DELICA LANCETS MISC Once daily dx 250.00       . sildenafil (VIAGRA) 100 MG tablet Take 100 mg by mouth daily as needed.        . simvastatin (ZOCOR) 40 MG tablet TAKE 1 TABLET ONCE DAILY.  90 tablet  3   Review of Systems Review of Systems  Constitutional: Negative for diaphoresis, activity change, appetite change and unexpected weight change.  HENT: Negative for  hearing loss, ear pain, facial swelling, mouth sores and neck stiffness.   Eyes: Negative for pain, redness and visual disturbance.  Respiratory: Negative for shortness of breath and wheezing.   Cardiovascular: Negative for chest pain and palpitations.  Gastrointestinal: Negative for diarrhea, blood in stool, abdominal distention and rectal pain.  Genitourinary: Negative for hematuria, flank pain and decreased urine volume.  Musculoskeletal: Negative for myalgias and joint swelling.  Skin: Negative for color change and wound.  Neurological: Negative for syncope and numbness.  Hematological: Negative for adenopathy.  Psychiatric/Behavioral:  Negative for hallucinations, self-injury, decreased concentration and agitation.      Objective:   Physical Exam BP 106/68  Pulse 91  Temp 97.5 F (36.4 C) (Oral)  Ht 5' 9.5" (1.765 m)  Wt 223 lb 4 oz (101.266 kg)  BMI 32.50 kg/m2  SpO2 94% Physical Exam  VS noted Constitutional: Pt is oriented to person, place, and time. Appears well-developed and well-nourished.  HENT:  Head: Normocephalic and atraumatic.  Right Ear: External ear normal.  Left Ear: External ear normal.  Nose: Nose normal.  Mouth/Throat: Oropharynx is clear and moist.  Eyes: Conjunctivae and EOM are normal. Pupils are equal, round, and reactive to light.  Neck: Normal range of motion. Neck supple. No JVD present. No tracheal deviation present.  Cardiovascular: Normal rate, regular rhythm, normal heart sounds and intact distal pulses.   Pulmonary/Chest: Effort normal and breath sounds normal.  Abdominal: Soft. Bowel sounds are normal. There is no tenderness.  Musculoskeletal: Normal range of motion. Exhibits no edema.  Lymphadenopathy:  Has no cervical adenopathy.  Neurological: Pt is alert and oriented to person, place, and time. Pt has normal reflexes. No cranial nerve deficit.  Skin: Skin is warm and dry. No rash noted.  Psychiatric:  Has  normal mood and affect. Behavior is normal.        Assessment & Plan:

## 2012-11-13 NOTE — Telephone Encounter (Signed)
Patient informed. 

## 2012-11-13 NOTE — Assessment & Plan Note (Signed)
Good control overall, to decrese the metformin 500 - 2 qd

## 2012-11-13 NOTE — Assessment & Plan Note (Signed)

## 2013-03-02 LAB — HM DIABETES EYE EXAM

## 2013-03-05 ENCOUNTER — Encounter: Payer: Self-pay | Admitting: Internal Medicine

## 2013-05-13 ENCOUNTER — Ambulatory Visit: Payer: Federal, State, Local not specified - PPO | Admitting: Internal Medicine

## 2013-05-13 DIAGNOSIS — Z0289 Encounter for other administrative examinations: Secondary | ICD-10-CM

## 2013-07-08 ENCOUNTER — Other Ambulatory Visit: Payer: Self-pay

## 2013-10-01 ENCOUNTER — Telehealth: Payer: Self-pay | Admitting: Internal Medicine

## 2013-10-01 DIAGNOSIS — Z0000 Encounter for general adult medical examination without abnormal findings: Secondary | ICD-10-CM

## 2013-10-01 MED ORDER — HYDROCODONE-ACETAMINOPHEN 7.5-325 MG PO TABS: 1.0000 | ORAL_TABLET | Freq: Four times a day (QID) | ORAL | Status: DC | PRN

## 2013-10-01 NOTE — Telephone Encounter (Signed)
Done hardcopy to Jeremy Abbott - also to remind pt:   You are given the letter explaining the transitional pain medication refill policy  Please be aware that I will no longer be able to offer monthly refills of any Schedule II or higher medication starting Jan 03, 2014

## 2013-10-01 NOTE — Telephone Encounter (Signed)
Pt needs an RX for hydrocodone to pick up.  He had his last physical  Dec.2013.   He has made an appt for the next avail physical on Feb. 5.

## 2013-10-02 NOTE — Telephone Encounter (Signed)
Labs entered for CPX and also informed to pickup hardcopy at the front desk of pain medication

## 2013-10-08 ENCOUNTER — Other Ambulatory Visit: Payer: Self-pay

## 2013-10-09 ENCOUNTER — Telehealth: Payer: Self-pay

## 2013-10-09 NOTE — Telephone Encounter (Signed)
Dismissal form has been signed and given to [employer].

## 2013-10-09 NOTE — Telephone Encounter (Signed)
The patient came in this morning to pick up his pain medicine. He told the front desk he did not have his license, or any other way to identify himself.  He asked Tammy to go see if Zella Ball would come up front to confirm his identity.  It was explained that this was, by law, not a suitable way to distribute pain medicine.   The patient returned to the front desk during the lunch hour.  He began yelling, becoming aggressive, and waiving a black booklet in the air.  He was asked by Harriett Sine to sit down, but refused, still yelling "hello!, hello!" and waiving his black booklet in the air, after Harriett Sine acknowledged him.  Harriett Sine was with a patient, I was on a phone call with a patient, and Cindee Lame was on a call with a patient.  Cindee Lame ended the phone call, and approached the front desk.  I ended my call, and joined her at the front desk.  I was concerned he would try to hurt her or verbally get more aggressive due to his yelling and agitated manner.  He stated he was here to pick up his pain medication and waived his license in Pete's face.  I asked him to remain calm, and I also asked him for his date of birth.  He refused to give me this information.  I explained that we would need to note his aggressive behavior, and I would need his date of birth to do that.  He again refused, and stood at the front desk, not leaving the premises.  Pete walked away, and I directed his attention to the exit door.  He again became aggressive and loud stating he was here to pick up his prescription and didn't understand why no one was at the desk when he entered.  I pointed to the exit door, and walked away back to my desk.   This incident was reported to [place]   I have noted his next appointment, in February, and will have to be away from the front desk during his check in time, due to being seriously scared for my, and the other's safety if he comes in aggitated.

## 2013-10-09 NOTE — Telephone Encounter (Signed)
Unfortunately this will qualify as unacceptable behavior and staff abuse.    Pt to be dismissed from practice

## 2013-11-03 ENCOUNTER — Telehealth: Payer: Self-pay | Admitting: Internal Medicine

## 2013-11-03 ENCOUNTER — Encounter: Payer: Self-pay | Admitting: Internal Medicine

## 2013-11-03 MED ORDER — HYDROCODONE-ACETAMINOPHEN 7.5-325 MG PO TABS: 1.0000 | ORAL_TABLET | Freq: Four times a day (QID) | ORAL | Status: DC | PRN

## 2013-11-03 NOTE — Telephone Encounter (Signed)
Done hardcopy to robin  

## 2013-11-03 NOTE — Telephone Encounter (Signed)
Called the patient informed to pickup hardcopy at the front desk. 

## 2013-11-11 ENCOUNTER — Telehealth: Payer: Self-pay | Admitting: Internal Medicine

## 2013-11-11 NOTE — Telephone Encounter (Addendum)
Patient dismissed from [facility] by Oliver Barre MD , effective October 09, 2013. Dismissal letter sent out by certified / registered mail. DAJ  Received signed domestic return receipt verifying delivery of certified letter on December 02, 2013. Article number [id number] DAJ

## 2014-01-07 ENCOUNTER — Encounter: Payer: Federal, State, Local not specified - PPO | Admitting: Internal Medicine

## 2014-09-17 ENCOUNTER — Other Ambulatory Visit: Payer: Self-pay

## 2015-05-30 ENCOUNTER — Other Ambulatory Visit: Payer: Self-pay

## 2015-06-17 ENCOUNTER — Encounter (HOSPITAL_COMMUNITY): Payer: Self-pay | Admitting: Emergency Medicine

## 2015-06-17 ENCOUNTER — Inpatient Hospital Stay (HOSPITAL_COMMUNITY)
Admission: EM | Admit: 2015-06-17 | Discharge: 2015-06-19 | DRG: 244 | Disposition: A | Discharge status: Home / Self Care | Payer: Medicare Other | Attending: Internal Medicine | Admitting: Internal Medicine

## 2015-06-17 ENCOUNTER — Observation Stay (HOSPITAL_COMMUNITY): Payer: Medicare Other

## 2015-06-17 ENCOUNTER — Encounter (HOSPITAL_COMMUNITY)
Admission: EM | Disposition: A | Discharge status: Home / Self Care | Payer: Federal, State, Local not specified - PPO | Source: Home / Self Care | Attending: Internal Medicine

## 2015-06-17 DIAGNOSIS — E876 Hypokalemia: Secondary | ICD-10-CM | POA: Diagnosis present

## 2015-06-17 DIAGNOSIS — R001 Bradycardia, unspecified: Secondary | ICD-10-CM

## 2015-06-17 DIAGNOSIS — I442 Atrioventricular block, complete: Principal | ICD-10-CM

## 2015-06-17 DIAGNOSIS — Z87891 Personal history of nicotine dependence: Secondary | ICD-10-CM

## 2015-06-17 DIAGNOSIS — Z79899 Other long term (current) drug therapy: Secondary | ICD-10-CM

## 2015-06-17 DIAGNOSIS — I441 Atrioventricular block, second degree: Secondary | ICD-10-CM | POA: Diagnosis present

## 2015-06-17 DIAGNOSIS — R42 Dizziness and giddiness: Secondary | ICD-10-CM | POA: Diagnosis not present

## 2015-06-17 DIAGNOSIS — I10 Essential (primary) hypertension: Secondary | ICD-10-CM | POA: Diagnosis present

## 2015-06-17 DIAGNOSIS — Z791 Long term (current) use of non-steroidal anti-inflammatories (NSAID): Secondary | ICD-10-CM

## 2015-06-17 DIAGNOSIS — Z833 Family history of diabetes mellitus: Secondary | ICD-10-CM

## 2015-06-17 DIAGNOSIS — Z8249 Family history of ischemic heart disease and other diseases of the circulatory system: Secondary | ICD-10-CM

## 2015-06-17 DIAGNOSIS — E785 Hyperlipidemia, unspecified: Secondary | ICD-10-CM | POA: Diagnosis present

## 2015-06-17 DIAGNOSIS — D72829 Elevated white blood cell count, unspecified: Secondary | ICD-10-CM | POA: Diagnosis present

## 2015-06-17 DIAGNOSIS — E11319 Type 2 diabetes mellitus with unspecified diabetic retinopathy without macular edema: Secondary | ICD-10-CM | POA: Diagnosis not present

## 2015-06-17 DIAGNOSIS — J449 Chronic obstructive pulmonary disease, unspecified: Secondary | ICD-10-CM | POA: Diagnosis present

## 2015-06-17 DIAGNOSIS — E119 Type 2 diabetes mellitus without complications: Secondary | ICD-10-CM | POA: Diagnosis present

## 2015-06-17 DIAGNOSIS — Z79891 Long term (current) use of opiate analgesic: Secondary | ICD-10-CM

## 2015-06-17 DIAGNOSIS — I4519 Other right bundle-branch block: Secondary | ICD-10-CM

## 2015-06-17 DIAGNOSIS — Z7982 Long term (current) use of aspirin: Secondary | ICD-10-CM

## 2015-06-17 DIAGNOSIS — I452 Bifascicular block: Secondary | ICD-10-CM | POA: Diagnosis present

## 2015-06-17 DIAGNOSIS — Z959 Presence of cardiac and vascular implant and graft, unspecified: Secondary | ICD-10-CM

## 2015-06-17 HISTORY — DX: Anxiety disorder, unspecified: F41.9

## 2015-06-17 HISTORY — DX: Essential (primary) hypertension: I10

## 2015-06-17 HISTORY — DX: Hypokalemia: E87.6

## 2015-06-17 HISTORY — DX: Hyperlipidemia, unspecified: E78.5

## 2015-06-17 HISTORY — DX: Allergic rhinitis, unspecified: J30.9

## 2015-06-17 HISTORY — DX: Fracture of unspecified part of right clavicle, initial encounter for closed fracture: S42.001A

## 2015-06-17 HISTORY — PX: EP IMPLANTABLE DEVICE: SHX172B

## 2015-06-17 HISTORY — DX: Depression, unspecified: F32.A

## 2015-06-17 HISTORY — DX: Attention-deficit hyperactivity disorder, unspecified type: F90.9

## 2015-06-17 HISTORY — DX: Type 2 diabetes mellitus without complications: E11.9

## 2015-06-17 HISTORY — DX: Atrioventricular block, complete: I44.2

## 2015-06-17 HISTORY — DX: Major depressive disorder, single episode, unspecified: F32.9

## 2015-06-17 LAB — CREATININE, SERUM: Creatinine, Ser: 0.84 mg/dL (ref 0.61–1.24)

## 2015-06-17 LAB — GLUCOSE, CAPILLARY
Glucose-Capillary: 92 mg/dL (ref 65–99)
Glucose-Capillary: 93 mg/dL (ref 65–99)

## 2015-06-17 LAB — CBC
HCT: 41.1 % (ref 39.0–52.0)
Hemoglobin: 13.9 g/dL (ref 13.0–17.0)
MCH: 29.5 pg (ref 26.0–34.0)
MCHC: 33.8 g/dL (ref 30.0–36.0)
MCV: 87.3 fL (ref 78.0–100.0)
Platelets: 189 10*3/uL (ref 150–400)
RBC: 4.71 MIL/uL (ref 4.22–5.81)
RDW: 13.8 % (ref 11.5–15.5)
WBC: 11.4 10*3/uL — ABNORMAL HIGH (ref 4.0–10.5)

## 2015-06-17 LAB — MAGNESIUM: Magnesium: 1.9 mg/dL (ref 1.7–2.4)

## 2015-06-17 LAB — PROTIME-INR
INR: 1.12 (ref 0.00–1.49)
PROTHROMBIN TIME: 14.6 s (ref 11.6–15.2)

## 2015-06-17 LAB — PHOSPHORUS: PHOSPHORUS: 3.6 mg/dL (ref 2.5–4.6)

## 2015-06-17 LAB — BASIC METABOLIC PANEL
ANION GAP: 10 (ref 5–15)
BUN: 24 mg/dL — AB (ref 6–20)
CO2: 26 mmol/L (ref 22–32)
CREATININE: 0.78 mg/dL (ref 0.61–1.24)
Calcium: 9.3 mg/dL (ref 8.9–10.3)
Chloride: 105 mmol/L (ref 101–111)
GFR calc Af Amer: >60 mL/min (ref >60)
Glucose, Bld: 143 mg/dL — ABNORMAL HIGH (ref 65–99)
Potassium: 3.3 mmol/L — ABNORMAL LOW (ref 3.5–5.1)
SODIUM: 141 mmol/L (ref 135–145)

## 2015-06-17 LAB — I-STAT TROPONIN, ED: TROPONIN I, POC: 0 ng/mL (ref 0.00–0.08)

## 2015-06-17 LAB — CBC WITH DIFFERENTIAL/PLATELET
BASOS ABS: 0 10*3/uL (ref 0.0–0.1)
BASOS PCT: 0 % (ref 0–1)
EOS ABS: 0.6 10*3/uL (ref 0.0–0.7)
Eosinophils Relative: 4 % (ref 0–5)
HCT: 43.6 % (ref 39.0–52.0)
HEMOGLOBIN: 14.8 g/dL (ref 13.0–17.0)
LYMPHS ABS: 2.6 10*3/uL (ref 0.7–4.0)
LYMPHS PCT: 20 % (ref 12–46)
MCH: 29.8 pg (ref 26.0–34.0)
MCHC: 33.9 g/dL (ref 30.0–36.0)
MCV: 87.9 fL (ref 78.0–100.0)
MONO ABS: 1.2 10*3/uL — AB (ref 0.1–1.0)
MONOS PCT: 9 % (ref 3–12)
NEUTROS PCT: 67 % (ref 43–77)
Neutro Abs: 8.7 10*3/uL — ABNORMAL HIGH (ref 1.7–7.7)
PLATELETS: 241 10*3/uL (ref 150–400)
RBC: 4.96 MIL/uL (ref 4.22–5.81)
RDW: 14.2 % (ref 11.5–15.5)
WBC: 13 10*3/uL — AB (ref 4.0–10.5)

## 2015-06-17 LAB — TROPONIN I
TROPONIN I: 0.06 ng/mL — AB (ref <0.031)
Troponin I: <0.03 ng/mL (ref <0.031)

## 2015-06-17 LAB — APTT: aPTT: 35 seconds (ref 24–37)

## 2015-06-17 SURGERY — PACEMAKER IMPLANT
Anesthesia: LOCAL

## 2015-06-17 MED ORDER — LIDOCAINE HCL (PF) 1 % IJ SOLN
INTRAMUSCULAR | Status: AC
  Filled 2015-06-17: qty 30

## 2015-06-17 MED ORDER — ACETAMINOPHEN 325 MG PO TABS: 650.0000 mg | ORAL_TABLET | ORAL | Status: DC | PRN

## 2015-06-17 MED ORDER — ACETAMINOPHEN 325 MG PO TABS: 325.0000 mg | ORAL_TABLET | ORAL | Status: DC | PRN

## 2015-06-17 MED ORDER — HEPARIN (PORCINE) IN NACL 2-0.9 UNIT/ML-% IJ SOLN
INTRAMUSCULAR | Status: AC
  Filled 2015-06-17: qty 500

## 2015-06-17 MED ORDER — FENTANYL CITRATE (PF) 100 MCG/2ML IJ SOLN
INTRAMUSCULAR | Status: AC
  Filled 2015-06-17: qty 2

## 2015-06-17 MED ORDER — AMPHETAMINE-DEXTROAMPHET ER 25 MG PO CP24: 25.0000 mg | ORAL_CAPSULE | ORAL | Status: DC

## 2015-06-17 MED ORDER — SODIUM CHLORIDE 0.9 % IV SOLN: INTRAVENOUS | Status: DC

## 2015-06-17 MED ORDER — CHLORHEXIDINE GLUCONATE 4 % EX LIQD: 60.0000 mL | Freq: Once | CUTANEOUS | Status: DC

## 2015-06-17 MED ORDER — MIDAZOLAM HCL 5 MG/5ML IJ SOLN
INTRAMUSCULAR | Status: AC
  Filled 2015-06-17: qty 5

## 2015-06-17 MED ORDER — ONDANSETRON HCL 4 MG/2ML IJ SOLN: 4.0000 mg | Freq: Four times a day (QID) | INTRAMUSCULAR | Status: DC | PRN

## 2015-06-17 MED ORDER — NITROGLYCERIN 0.4 MG SL SUBL: 0.4000 mg | SUBLINGUAL_TABLET | SUBLINGUAL | Status: DC | PRN

## 2015-06-17 MED ORDER — SODIUM CHLORIDE 0.9 % IR SOLN
Status: AC
  Filled 2015-06-17: qty 2

## 2015-06-17 MED ORDER — AMPHETAMINE-DEXTROAMPHET ER 10 MG PO CP24
25.0000 mg | ORAL_CAPSULE | ORAL | Status: DC
  Administered 2015-06-18: 25 mg via ORAL
  Filled 2015-06-17: qty 2
  Filled 2015-06-17: qty 1

## 2015-06-17 MED ORDER — SODIUM CHLORIDE 0.9 % IJ SOLN: 3.0000 mL | INTRAMUSCULAR | Status: DC | PRN

## 2015-06-17 MED ORDER — ASPIRIN-ACETAMINOPHEN-CAFFEINE 250-250-65 MG PO TABS
2.0000 | ORAL_TABLET | Freq: Four times a day (QID) | ORAL | Status: DC | PRN
  Administered 2015-06-18 – 2015-06-19 (×3): 2 via ORAL
  Filled 2015-06-17 (×6): qty 2

## 2015-06-17 MED ORDER — SODIUM CHLORIDE 0.9 % IV SOLN: INTRAVENOUS | Status: AC

## 2015-06-17 MED ORDER — CEFAZOLIN SODIUM 1-5 GM-% IV SOLN
1.0000 g | Freq: Four times a day (QID) | INTRAVENOUS | Status: AC
  Administered 2015-06-17 – 2015-06-18 (×3): 1 g via INTRAVENOUS
  Filled 2015-06-17 (×3): qty 50

## 2015-06-17 MED ORDER — CHLORHEXIDINE GLUCONATE 4 % EX LIQD
60.0000 mL | Freq: Once | CUTANEOUS | Status: DC
  Filled 2015-06-17: qty 60

## 2015-06-17 MED ORDER — LISINOPRIL 20 MG PO TABS
20.0000 mg | ORAL_TABLET | Freq: Every day | ORAL | Status: DC
  Administered 2015-06-18 – 2015-06-19 (×2): 20 mg via ORAL
  Filled 2015-06-17 (×2): qty 1

## 2015-06-17 MED ORDER — AMPHETAMINE-DEXTROAMPHETAMINE 30 MG PO TABS: 15.0000 mg | ORAL_TABLET | Freq: Three times a day (TID) | ORAL | Status: DC

## 2015-06-17 MED ORDER — ASPIRIN EC 81 MG PO TBEC
81.0000 mg | DELAYED_RELEASE_TABLET | Freq: Every day | ORAL | Status: DC
  Filled 2015-06-17 (×3): qty 1

## 2015-06-17 MED ORDER — SIMVASTATIN 40 MG PO TABS
40.0000 mg | ORAL_TABLET | Freq: Every day | ORAL | Status: DC
  Administered 2015-06-17: 40 mg via ORAL
  Filled 2015-06-17 (×4): qty 1

## 2015-06-17 MED ORDER — HEPARIN SODIUM (PORCINE) 5000 UNIT/ML IJ SOLN
5000.0000 [IU] | Freq: Three times a day (TID) | INTRAMUSCULAR | Status: DC
  Administered 2015-06-17 – 2015-06-18 (×4): 5000 [IU] via SUBCUTANEOUS
  Filled 2015-06-17 (×7): qty 1

## 2015-06-17 MED ORDER — HYDROCODONE-ACETAMINOPHEN 7.5-325 MG PO TABS
1.0000 | ORAL_TABLET | Freq: Four times a day (QID) | ORAL | Status: DC | PRN
  Administered 2015-06-17 – 2015-06-19 (×5): 1 via ORAL
  Filled 2015-06-17 (×7): qty 1

## 2015-06-17 MED ORDER — SODIUM CHLORIDE 0.9 % IV SOLN: 250.0000 mL | INTRAVENOUS | Status: DC | PRN

## 2015-06-17 MED ORDER — CEFAZOLIN SODIUM-DEXTROSE 2-3 GM-% IV SOLR
2.0000 g | INTRAVENOUS | Status: AC
  Administered 2015-06-17: 2 g via INTRAVENOUS

## 2015-06-17 MED ORDER — SODIUM CHLORIDE 0.9 % IR SOLN
80.0000 mg | Status: AC
  Administered 2015-06-17: 80 mg

## 2015-06-17 SURGICAL SUPPLY — 8 items
CABLE SURGICAL S-101-97-12 (CABLE) ×2 IMPLANT
HEMOSTAT SURGICEL 2X4 FIBR (HEMOSTASIS) ×1 IMPLANT
LEAD CAPSURE NOVUS 5076-52CM (Lead) ×1 IMPLANT
LEAD CAPSURE NOVUS 5076-58CM (Lead) ×1 IMPLANT
PAD DEFIB LIFELINK (PAD) ×1 IMPLANT
PPM ADVISA MRI DR A2DR01 (Pacemaker) ×1 IMPLANT
SHEATH CLASSIC 7F (SHEATH) ×2 IMPLANT
TRAY PACEMAKER INSERTION (CUSTOM PROCEDURE TRAY) ×1 IMPLANT

## 2015-06-17 NOTE — ED Notes (Signed)
Dr. Micheline Mazeocherty at bedside.  Placed radiotranslucent defib pads on patient and hooked up to zoll.

## 2015-06-17 NOTE — Progress Notes (Signed)
eLink Physician-Brief Progress Note Patient Name: Jeremy Abbott DOB: 03/18/1946 MRN: [id number]   Date of Service  06/17/2015  HPI/Events of Note  [address]89 M with DM/COPD admitted to CCU with symptomatic bradycardia.  Underwent placement of PM/AICD today.  Patient is HD stable with adequate sats and resp status.  eICU Interventions  Plan of care per primary cardiology team. [facility]     Intervention Category Evaluation Type: New Patient Evaluation  DETERDING,ELIZABETH 06/17/2015, 11:07 PM

## 2015-06-17 NOTE — ED Notes (Signed)
Cardiologist at bedside.  

## 2015-06-17 NOTE — ED Notes (Signed)
Electrophysiology at bedside

## 2015-06-17 NOTE — ED Provider Notes (Signed)
CSN: [id number]     Arrival date & time 06/17/15  4010 History   First MD Initiated Contact with Patient 06/17/15 1000     Chief Complaint  Patient presents with  . Bradycardia     (Consider location/radiation/quality/duration/timing/severity/associated sxs/prior Treatment) HPI Comments:  Patient sent from PCPs office today for bradycardia he's had several episodes since April.  He will get lightheaded when changing positions quickly and has had some fatigue.  He denies chest pain, shortness of breath, leg pain or swelling.  He is currently asymptomatic.  He is a history of a right bundle branch block, but no known prior AV blocks  Patient is a 69 y.o. male presenting with near-syncope. The history is provided by the patient. No language interpreter was used.  Near Syncope This is a recurrent problem. The current episode started more than 1 week ago. The problem occurs rarely. The problem has been resolved. Pertinent negatives include no chest pain, no abdominal pain, no headaches and no shortness of breath. Associated symptoms comments:  bradycardia. Exacerbated by:   chaanging position quickly. Nothing relieves the symptoms. He has tried nothing for the symptoms. The treatment provided no relief.    Past Medical History  Diagnosis Date  . DIABETES MELLITUS, TYPE II 02/19/2008  . HYPERLIPIDEMIA 02/19/2008  . ANXIETY 02/19/2008  . Adjustment disorder with anxiety 08/04/2008  . DEPRESSION 02/19/2008  . ADD 08/04/2008  . HYPERTENSION 02/19/2008  . ALLERGIC RHINITIS 02/19/2008  . FRACTURE, CLAVICLE, RIGHT 11/09/2009    Chronic right collarbone pain-non union  . TACHYCARDIA 10/19/2010  . RECTAL BLEEDING 01/22/2008  . PERIPHERAL EDEMA 02/19/2008  . NSAID long-term use   . COPD (chronic obstructive pulmonary disease) 11/12/2011   Past Surgical History  Procedure Laterality Date  . Tonsillectomy    . Orif clavicle fracture  2/08    s/p collarbone fx with Will Bonnet   Family History   Problem Relation Age of Onset  . Diabetes Daughter   . Heart failure Father    History  Substance Use Topics  . Smoking status: Former Games developer  . Smokeless tobacco: Not on file  . Alcohol Use: No    Review of Systems  Constitutional: Negative for fever, activity change, appetite change and fatigue.  HENT: Negative for congestion, facial swelling, rhinorrhea and trouble swallowing.   Eyes: Negative for photophobia and pain.  Respiratory: Negative for cough, chest tightness and shortness of breath.   Cardiovascular: Positive for near-syncope. Negative for chest pain and leg swelling.  Gastrointestinal: Negative for nausea, vomiting, abdominal pain, diarrhea and constipation.  Endocrine: Negative for polydipsia and polyuria.  Genitourinary: Negative for dysuria, urgency, decreased urine volume and difficulty urinating.  Musculoskeletal: Negative for back pain and gait problem.  Skin: Negative for color change, rash and wound.  Allergic/Immunologic: Negative for immunocompromised state.  Neurological: Negative for dizziness, facial asymmetry, speech difficulty, weakness, numbness and headaches.  Psychiatric/Behavioral: Negative for confusion, decreased concentration and agitation.      Allergies  Review of patient's allergies indicates no known allergies.  Home Medications   Prior to Admission medications   Medication Sig Start Date End Date Taking? Authorizing Provider  amphetamine-dextroamphetamine (ADDERALL XR) 25 MG 24 hr capsule Take 25 mg by mouth every morning.   Yes Historical Provider, MD  aspirin-acetaminophen-caffeine (EXCEDRIN MIGRAINE) [phone] MG per tablet Take 2 tablets by mouth every 6 (six) hours as needed for headache.   Yes Historical Provider, MD  glucose blood (ONE TOUCH ULTRA TEST) test strip Once daily  dx 250.00 11/13/12  Yes Corwin LevinsJames W John, MD  hydrochlorothiazide (HYDRODIURIL) 25 MG tablet Take 1 tablet (25 mg total) by mouth daily. 11/13/12  Yes Corwin LevinsJames W  John, MD  HYDROcodone-acetaminophen (NORCO) 7.5-325 MG per tablet Take 1 tablet by mouth 4 (four) times daily as needed. 11/03/13  Yes Corwin LevinsJames W John, MD  ibuprofen (ADVIL,MOTRIN) 200 MG tablet Take 200 mg by mouth 4 (four) times daily as needed.     Yes Historical Provider, MD  lisinopril (PRINIVIL,ZESTRIL) 20 MG tablet Take 1 tablet (20 mg total) by mouth daily. 11/13/12  Yes Corwin LevinsJames W John, MD  metFORMIN (GLUCOPHAGE XR) 500 MG 24 hr tablet TAKE 2 TABLETS EVERY AM. Patient taking differently: Take 1,000 mg by mouth every morning.  11/13/12  Yes Corwin LevinsJames W John, MD  [facility]ANC[facility] Once daily dx 250.00 Once daily dx 250.00 11/13/12  Yes Corwin LevinsJames W John, MD  sildenafil (VIAGRA) 100 MG tablet Take 1 tablet (100 mg total) by mouth daily as needed. 11/13/12  Yes Corwin LevinsJames W John, MD  simvastatin (ZOCOR) 40 MG tablet Take 1 tablet (40 mg total) by mouth at bedtime. 11/13/12  Yes Corwin LevinsJames W John, MD  amitriptyline (ELAVIL) 50 MG tablet Take 1 tablet (50 mg total) by mouth at bedtime as needed for pain. 11/13/12 11/13/13  Corwin LevinsJames W John, MD   BP 117/63 mmHg  Pulse 74  Temp(Src) 97.9 F (36.6 C) (Oral)  Resp 16  Ht 5\' 9"  (1.753 m)  Wt 210 lb (95.255 kg)  BMI 31.00 kg/m2  SpO2 97% Physical Exam  Constitutional: He is oriented to person, place, and time. He appears well-developed and well-nourished. No distress.  HENT:  Head: Normocephalic and atraumatic.  Mouth/Throat: No oropharyngeal exudate.  Eyes: Pupils are equal, round, and reactive to light.  Neck: Normal range of motion. Neck supple.  Cardiovascular: Regular rhythm and normal heart sounds.  Bradycardia present.  Exam reveals no gallop and no friction rub.   No murmur heard. Pulmonary/Chest: Effort normal and breath sounds normal. No respiratory distress. He has no wheezes. He has no rales.  Abdominal: Soft. Bowel sounds are normal. He exhibits no distension and no mass. There is no tenderness. There is no rebound and no guarding.   Musculoskeletal: Normal range of motion. He exhibits no edema or tenderness.  Neurological: He is alert and oriented to person, place, and time.  Skin: Skin is warm and dry.  Psychiatric: He has a normal mood and affect.    ED Course  Procedures (including critical care time) Labs Review Labs Reviewed  CBC WITH DIFFERENTIAL/PLATELET - Abnormal; Notable for the following:    WBC 13.0 (*)    Neutro Abs 8.7 (*)    Monocytes Absolute 1.2 (*)    All other components within normal limits  BASIC METABOLIC PANEL - Abnormal; Notable for the following:    Potassium 3.3 (*)    Glucose, Bld 143 (*)    BUN 24 (*)    All other components within normal limits  CBC - Abnormal; Notable for the following:    WBC 11.4 (*)    All other components within normal limits  TROPONIN I - Abnormal; Notable for the following:    Troponin I 0.06 (*)    All other components within normal limits  CBC - Abnormal; Notable for the following:    WBC 13.1 (*)    HCT 38.8 (*)    All other components within normal limits  LIPID PANEL - Abnormal; Notable  for the following:    HDL 21 (*)    All other components within normal limits  BASIC METABOLIC PANEL - Abnormal; Notable for the following:    Potassium 3.4 (*)    Calcium 8.2 (*)    All other components within normal limits  MAGNESIUM  PHOSPHORUS  GLUCOSE, CAPILLARY  CREATININE, SERUM  TROPONIN I  PROTIME-INR  APTT  GLUCOSE, CAPILLARY  I-STAT TROPOININ, ED    Imaging Review Dg Chest Port 1 View  06/18/2015   CLINICAL DATA:  Cardiac device in situ.  Shortness of breath.  EXAM: PORTABLE CHEST - 1 VIEW  COMPARISON:  10/19/2010  FINDINGS: Patient's left-sided transvenous pacemaker, leads to the right atrium and right ventricle. Heart size is normal. No pulmonary edema. There is minimal left subsegmental atelectasis. Otherwise no focal consolidations or pulmonary edema. Old right clavicle fracture.  IMPRESSION: 1. Minimal left lower lobe atelectasis. 2. No  pulmonary edema.   Electronically Signed   By: Norva Pavlov M.D.   On: 06/18/2015 07:17     EKG Interpretation   Date/Time:  Friday June 17 2015 13:58:41 EDT Ventricular Rate:  34 PR Interval:  238 QRS Duration: 152 QT Interval:  611 QTC Calculation: 459 R Axis:   96 Text Interpretation:  Complete AV block with wide QRS complex RBBB and  LPFB Changed from prior type II AVB ED PHYSICIAN INTERPRETATION AVAILABLE  IN [facility] Reconfirmed by DOCHERTY  MD, MEGAN [phone]) on 06/18/2015  9:46:59 AM     CRITICAL CARE Performed by: Toy Cookey, E Total critical care time: Critical care time was exclusive of separately billable procedures and treating other patients. Critical care was necessary to treat or prevent imminent or life-threatening deterioration. Critical care was time spent personally by me on the following activities: development of treatment plan with patient and/or surrogate as well as nursing, discussions with consultants, evaluation of patient's response to treatment, examination of patient, obtaining history from patient or surrogate, ordering and performing treatments and interventions, ordering and review of laboratory studies, ordering and review of radiographic studies, pulse oximetry and re-evaluation of patient's condition.   MDM   Final diagnoses:  AV block, 3rd degree  Symptomatic bradycardia    Pt is a 69 y.o. male with Pmhx as above who presents with bradycardia from PCP's office.  He states he has had several episodes of lightheadedness when changing positions quickly since April as well as some fatigue but denies symptoms today also denies chest pain, shortness of breath leg pain or swelling he states he was lying down last night and noticed that his heart rate felt slow and was reading 37 on his blood pressure cuff.  He went to his PCPs office this morning, was sent to the ED for bradycardia EKG in the department with a 3-1 AV block that is  concerning for a type II Mobitz AB block.  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 Zoll pads placed. Cardiology made aware. Pt's mentation remains nml, BP remains stable. Waiting for admission to cardiology service and pacemaker placement.       Toy Cookey, MD 06/18/15 [phone]

## 2015-06-17 NOTE — ED Notes (Signed)
Called lab to inform them of added BMP

## 2015-06-17 NOTE — Progress Notes (Signed)
PT DEVICE DEPENDENT I had advised the family that I anticipate discharge on Tuesday

## 2015-06-17 NOTE — ED Notes (Signed)
Cards at bedside

## 2015-06-17 NOTE — ED Notes (Signed)
Dr. Docherty at bedside.

## 2015-06-17 NOTE — ED Notes (Signed)
Per EMS, pt coming from his doctors office for bradycardia. The pt noticed that his heart rate was low last night and that he was feeling dizzy when standing. Pt went to his doctors and they found his HR to be in the mid 30's. NAD at this time. Pt alert x4.

## 2015-06-17 NOTE — Consult Note (Signed)
ELECTROPHYSIOLOGY CONSULT NOTE    Patient ID: Jeremy Abbott Carlino MRN: [id number], DOB/AGE: 69/01/1946 69 y.o.  Admit date: 06/17/2015 Date of Consult: 06/17/2015  Primary Physician: No primary care provider on file. Primary Cardiologist: new this admission [facility])  Reason for Consultation: complete heart block  HPI:  Jeremy Abbott Hornik is a 69 y.o. male with a past medical history significant for diabetes, hyperlipidemia, ADD, hypertension.  He is functionally very active and developed abrupt onset fatigue with a documented heart rate of 37 at home.  He presented to his PCP's office this morning and was found to have documented heart block and was sent to the ER for further evaluation.  Lab work is remarkable for K of 3.3 and slightly elevated WBC.  He denies recent fevers, chills, shortness of breath, chest pain, syncope. He does have occasional orthostatic intolerance.   He has baseline conduction system disease with RBBB.   EP has been asked to evaluate for treatment options.  Echo has been ordered and is pending.   Past Medical History  Diagnosis Date  . DIABETES MELLITUS, TYPE II 02/19/2008  . HYPERLIPIDEMIA 02/19/2008  . ANXIETY 02/19/2008  . Adjustment disorder with anxiety 08/04/2008  . DEPRESSION 02/19/2008  . ADD 08/04/2008  . HYPERTENSION 02/19/2008  . ALLERGIC RHINITIS 02/19/2008  . FRACTURE, CLAVICLE, RIGHT 11/09/2009    Chronic right collarbone pain-non union  . TACHYCARDIA 10/19/2010  . RECTAL BLEEDING 01/22/2008  . PERIPHERAL EDEMA 02/19/2008  . NSAID long-term use   . COPD (chronic obstructive pulmonary disease) 11/12/2011     Surgical History:  Past Surgical History  Procedure Laterality Date  . Tonsillectomy    . Orif clavicle fracture  2/08    s/Abbott collarbone fx with Fall-Murphy Wainer     No current facility-administered medications for this encounter.  Current outpatient prescriptions:  .  amitriptyline (ELAVIL) 50 MG tablet, Take 1 tablet (50 mg total) by mouth at  bedtime as needed for pain., Disp: 90 tablet, Rfl: 1 .  glucose blood (ONE TOUCH ULTRA TEST) test strip, Once daily dx 250.00, Disp: 100 each, Rfl: 11 .  hydrochlorothiazide (HYDRODIURIL) 25 MG tablet, Take 1 tablet (25 mg total) by mouth daily., Disp: 90 tablet, Rfl: 3 .  HYDROcodone-acetaminophen (NORCO) 7.5-325 MG per tablet, Take 1 tablet by mouth 4 (four) times daily as needed., Disp: 120 tablet, Rfl: 0 .  ibuprofen (ADVIL,MOTRIN) 200 MG tablet, Take 200 mg by mouth 4 (four) times daily as needed.  , Disp: , Rfl:  .  lisinopril (PRINIVIL,ZESTRIL) 20 MG tablet, Take 1 tablet (20 mg total) by mouth daily., Disp: 90 tablet, Rfl: 3 .  metFORMIN (GLUCOPHAGE XR) 500 MG 24 hr tablet, TAKE 2 TABLETS EVERY AM., Disp: 180 tablet, Rfl: 3 .  ONETOUCH DELICA LANCETS MISC, Once daily dx 250.00 Once daily dx 250.00, Disp: 100 each, Rfl: 11 .  sildenafil (VIAGRA) 100 MG tablet, Take 1 tablet (100 mg total) by mouth daily as needed., Disp: 10 tablet, Rfl: 11 .  simvastatin (ZOCOR) 40 MG tablet, Take 1 tablet (40 mg total) by mouth at bedtime., Disp: 90 tablet, Rfl: 3   Allergies: No Known Allergies  History   Social History  . Marital Status: Married    Spouse Name: N/A  . Number of Children: N/A  . Years of Education: N/A   Occupational History  . [employer]     Retired0   Social History Main Topics  . Smoking status: Former Games developermoker  . Smokeless tobacco:  Not on file  . Alcohol Use: No  . Drug Use: No  . Sexual Activity: Not on file   Other Topics Concern  . Not on file   Social History Narrative     Family History  Problem Relation Age of Onset  . Diabetes Daughter   . Heart failure Father      Review of Systems: All other systems reviewed and are otherwise negative except as noted above.  Physical Exam: Filed Vitals:   06/17/15 1100 06/17/15 1115 06/17/15 1130 06/17/15 1145  BP: 127/55 118/50 121/85 126/48  Pulse: 34 34 36 34  Temp:      TempSrc:      Resp: Height:      Weight:      SpO2: 97% 97% 98% 98%    GEN- The patient is well appearing, alert and oriented x 3 today.   HEENT: normocephalic, atraumatic; sclera clear, conjunctiva pink; hearing intact; oropharynx clear; neck supple, no JVP Lymph- no cervical lymphadenopathy Lungs- Clear to ausculation bilaterally, normal work of breathing.  No wheezes, rales, rhonchi Heart- Bradycardic regular rate and rhythm  GI- soft, non-tender, non-distended, bowel sounds present  Extremities- no clubbing, cyanosis, or edema; DP/PT/radial pulses 1+ bilaterally MS- no significant deformity or atrophy Skin- warm and dry, no rash or lesion Psych- euthymic mood, full affect Neuro- strength and sensation are intact  Labs:   Lab Results  Component Value Date   WBC 13.0* 06/17/2015   HGB 14.8 06/17/2015   HCT 43.6 06/17/2015   MCV 87.9 06/17/2015   PLT 241 06/17/2015    Recent Labs Lab 06/17/15 0955  NA 141  K 3.3*  CL 105  CO2 26  BUN 24*  CREATININE 0.78  CALCIUM 9.3  GLUCOSE 143*      Radiology/Studies: No results found.  ZOX:[id number] heart block, ventricular rate 36. RBBB, LAFB  TELEMETRY: complete heart block, ventricular rates in the 30's  Assessment/Plan:  1.  Complete heart block The patient has symptomatic complete heart block with no reversible causes identified. Echo is pending.  PPM implantation is recommended. Risks, benefits reviewed with the patient and his wife who wishes to proceed.  Pt tentatively placed on the board for later this afternoon.   2. HTN Stable No change required today  Dr Graciela Husbands to see later today.   Signed, Gypsy Balsam, NP 06/17/2015 1:03 PM   Escape rhythm is RBBB with rithgward axiswith similar QRS to intrinsic suggestive of junctional escape    Will check EF to decide re device  Tr 0.0 8 hrs later makes MI less likely   w9ill checko echo\  The benefits and risks were reviewed including but not limited to death,  perforation,  infection, lead dislodgement and device malfunction.  The patient understands agrees and is willing to proceed.

## 2015-06-17 NOTE — H&P (Signed)
ADMISSION HISTORY & PHYSICAL   Chief Complaint:  Weakness, dizziness, low heartrate  Cardiologist: None (NEW)  Primary Care Physician: No primary care provider on file.  HPI:  This is a 69 y.o. male with a past medical history significant for type 2 diabetes, dyslipidemia, anxiety/depression, and COPD with a former smoking history. He presented to the ER today from his doctor's office as he was noted to be dizzy last night and fatigued, especially with change in position. In the office he was found to have a heart rate in the 30s and was referred to the emergency department. EKG here shows a right bundle branch block and left anterior fascicular block with Mobitz 2 AV block (2:1) and heart rate of 36.  He does not appear to be on any AV nodal blocking agents. BP stable. Cardiology consulted regarding symptomatic bradycardia.  PMHx:  Past Medical History  Diagnosis Date  . DIABETES MELLITUS, TYPE II 02/19/2008  . HYPERLIPIDEMIA 02/19/2008  . ANXIETY 02/19/2008  . Adjustment disorder with anxiety 08/04/2008  . DEPRESSION 02/19/2008  . ADD 08/04/2008  . HYPERTENSION 02/19/2008  . ALLERGIC RHINITIS 02/19/2008  . FRACTURE, CLAVICLE, RIGHT 11/09/2009    Chronic right collarbone pain-non union  . TACHYCARDIA 10/19/2010  . RECTAL BLEEDING 01/22/2008  . PERIPHERAL EDEMA 02/19/2008  . NSAID long-term use   . COPD (chronic obstructive pulmonary disease) 11/12/2011    Past Surgical History  Procedure Laterality Date  . Tonsillectomy    . Orif clavicle fracture  2/08    s/p collarbone fx with Jeni Salles    FAMHx:  Family History  Problem Relation Age of Onset  . Diabetes Daughter   . Heart failure Father     SOCHx:   reports that he has quit smoking. He does not have any smokeless tobacco history on file. He reports that he does not drink alcohol or use illicit drugs.  ALLERGIES:  No Known Allergies  ROS: A comprehensive review of systems was negative except for:  Constitutional: positive for fatigue Neurological: positive for dizziness  HOME MEDS:   Medication List    ASK your doctor about these medications        amitriptyline 50 MG tablet  Commonly known as:  ELAVIL  Take 1 tablet (50 mg total) by mouth at bedtime as needed for pain.     amphetamine-dextroamphetamine 30 MG tablet  Commonly known as:  ADDERALL  Take 0.5 tablets (15 mg total) by mouth 3 (three) times daily. 1/2 tablet by mouth three times a day - to fill Jan 12, 2013     ECOTRIN LOW STRENGTH 81 MG EC tablet  Generic drug:  aspirin  Take 81 mg by mouth daily.     glucose blood test strip  Commonly known as:  ONE TOUCH ULTRA TEST  Once daily dx 250.00     hydrochlorothiazide 25 MG tablet  Commonly known as:  HYDRODIURIL  Take 1 tablet (25 mg total) by mouth daily.     HYDROcodone-acetaminophen 7.5-325 MG per tablet  Commonly known as:  NORCO  Take 1 tablet by mouth 4 (four) times daily as needed.     ibuprofen 200 MG tablet  Commonly known as:  ADVIL,MOTRIN  Take 200 mg by mouth 4 (four) times daily as needed.     lisinopril 20 MG tablet  Commonly known as:  PRINIVIL,ZESTRIL  Take 1 tablet (20 mg total) by mouth daily.     metFORMIN 500 MG 24 hr tablet  Commonly  known as:  GLUCOPHAGE XR  TAKE 2 TABLETS EVERY AM.     ONETOUCH DELICA LANCETS Misc  Once daily dx 250.00 Once daily dx 250.00     sildenafil 100 MG tablet  Commonly known as:  VIAGRA  Take 1 tablet (100 mg total) by mouth daily as needed.     simvastatin 40 MG tablet  Commonly known as:  ZOCOR  Take 1 tablet (40 mg total) by mouth at bedtime.        LABS/IMAGING: Results for orders placed or performed during the hospital encounter of 06/17/15 (from the past 48 hour(s))  CBC with Differential/Platelet     Status: Abnormal   Collection Time: 06/17/15  9:55 AM  Result Value Ref Range   WBC 13.0 (H) 4.0 - 10.5 K/uL   RBC 4.96 4.22 - 5.81 MIL/uL   Hemoglobin 14.8 13.0 - 17.0 g/dL   HCT 43.6  39.0 - 52.0 %   MCV 87.9 78.0 - 100.0 fL   MCH 29.8 26.0 - 34.0 pg   MCHC 33.9 30.0 - 36.0 g/dL   RDW 14.2 11.5 - 15.5 %   Platelets 241 150 - 400 K/uL   Neutrophils Relative % 67 43 - 77 %   Neutro Abs 8.7 (H) 1.7 - 7.7 K/uL   Lymphocytes Relative 20 12 - 46 %   Lymphs Abs 2.6 0.7 - 4.0 K/uL   Monocytes Relative 9 3 - 12 %   Monocytes Absolute 1.2 (H) 0.1 - 1.0 K/uL   Eosinophils Relative 4 0 - 5 %   Eosinophils Absolute 0.6 0.0 - 0.7 K/uL   Basophils Relative 0 0 - 1 %   Basophils Absolute 0.0 0.0 - 0.1 K/uL  Magnesium     Status: None   Collection Time: 06/17/15  9:55 AM  Result Value Ref Range   Magnesium 1.9 1.7 - 2.4 mg/dL  Phosphorus     Status: None   Collection Time: 06/17/15  9:55 AM  Result Value Ref Range   Phosphorus 3.6 2.5 - 4.6 mg/dL  Basic metabolic panel     Status: Abnormal   Collection Time: 06/17/15  9:55 AM  Result Value Ref Range   Sodium 141 135 - 145 mmol/L   Potassium 3.3 (L) 3.5 - 5.1 mmol/L   Chloride 105 101 - 111 mmol/L   CO2 26 22 - 32 mmol/L   Glucose, Bld 143 (H) 65 - 99 mg/dL   BUN 24 (H) 6 - 20 mg/dL   Creatinine, Ser 0.78 0.61 - 1.24 mg/dL   Calcium 9.3 8.9 - 10.3 mg/dL   GFR calc non Af Amer >60 >60 mL/min   GFR calc Af Amer >60 >60 mL/min    Comment: (NOTE) The eGFR has been calculated using the CKD EPI equation. This calculation has not been validated in all clinical situations. eGFR's persistently <60 mL/min signify possible Chronic Kidney Disease.    Anion gap 10 5 - 15  I-stat troponin, ED     Status: None   Collection Time: 06/17/15 10:02 AM  Result Value Ref Range   Troponin i, poc 0.00 0.00 - 0.08 ng/mL   Comment 3            Comment: Due to the release kinetics of cTnI, a negative result within the first hours of the onset of symptoms does not rule out myocardial infarction with certainty. If myocardial infarction is still suspected, repeat the test at appropriate intervals.    No results found.  VITALS: Filed  Vitals:   06/17/15 1130  BP: 121/85  Pulse: 36  Temp:   Resp: 13    EXAM: General appearance: alert and no distress Neck: no carotid bruit and no JVD Lungs: clear to auscultation bilaterally Heart: regular bradycardia Abdomen: soft, non-tender; bowel sounds normal; no masses,  no organomegaly Extremities: edema 1+ bilateral pitting and varicose veins noted Pulses: 2+ and symmetric Skin: Skin color, texture, turgor normal. No rashes or lesions Neurologic: Grossly normal Psych: Pleasant  IMPRESSION: Principal Problem:   Mobitz type 2 second degree AV block Active Problems:   DM2 (diabetes mellitus, type 2)   Hyperlipidemia   Essential hypertension   Symptomatic bradycardia   RBBB with left anterior fascicular block   PLAN: 1. Mr. Bowens has symptomatic bradycardia and high-grade AV block with underlying right bundle-branch block and left anterior fascicular block. He is not on any AV nodal blocking agents. Based on these findings, I would suggest that a pacemaker is necessary. I will ask my colleagues in cardiac electrophysiology to evaluate him for possible pacemaker. He is currently NPO and may be able to have a pacemaker placed today based on their schedule.  Pixie Casino, MD, [facility] Attending Cardiologist [place] [facility] [place] 06/17/2015, 11:59 AM

## 2015-06-17 NOTE — Progress Notes (Signed)
  Echocardiogram 2D Echocardiogram has been performed.  Delcie RochENNINGTON, Kahleb Mcclane 06/17/2015, 4:34 PM

## 2015-06-18 ENCOUNTER — Observation Stay (HOSPITAL_COMMUNITY): Payer: Medicare Other

## 2015-06-18 DIAGNOSIS — E785 Hyperlipidemia, unspecified: Secondary | ICD-10-CM | POA: Diagnosis present

## 2015-06-18 DIAGNOSIS — R42 Dizziness and giddiness: Secondary | ICD-10-CM | POA: Diagnosis present

## 2015-06-18 DIAGNOSIS — I442 Atrioventricular block, complete: Secondary | ICD-10-CM | POA: Diagnosis present

## 2015-06-18 DIAGNOSIS — Z8249 Family history of ischemic heart disease and other diseases of the circulatory system: Secondary | ICD-10-CM | POA: Diagnosis not present

## 2015-06-18 DIAGNOSIS — Z7982 Long term (current) use of aspirin: Secondary | ICD-10-CM | POA: Diagnosis not present

## 2015-06-18 DIAGNOSIS — E876 Hypokalemia: Secondary | ICD-10-CM | POA: Diagnosis present

## 2015-06-18 DIAGNOSIS — E119 Type 2 diabetes mellitus without complications: Secondary | ICD-10-CM | POA: Diagnosis present

## 2015-06-18 DIAGNOSIS — Z791 Long term (current) use of non-steroidal anti-inflammatories (NSAID): Secondary | ICD-10-CM | POA: Diagnosis not present

## 2015-06-18 DIAGNOSIS — Z79899 Other long term (current) drug therapy: Secondary | ICD-10-CM | POA: Diagnosis not present

## 2015-06-18 DIAGNOSIS — Z833 Family history of diabetes mellitus: Secondary | ICD-10-CM | POA: Diagnosis not present

## 2015-06-18 DIAGNOSIS — Z87891 Personal history of nicotine dependence: Secondary | ICD-10-CM | POA: Diagnosis not present

## 2015-06-18 DIAGNOSIS — D72829 Elevated white blood cell count, unspecified: Secondary | ICD-10-CM | POA: Diagnosis present

## 2015-06-18 DIAGNOSIS — I10 Essential (primary) hypertension: Secondary | ICD-10-CM | POA: Diagnosis present

## 2015-06-18 DIAGNOSIS — J449 Chronic obstructive pulmonary disease, unspecified: Secondary | ICD-10-CM | POA: Diagnosis present

## 2015-06-18 DIAGNOSIS — Z79891 Long term (current) use of opiate analgesic: Secondary | ICD-10-CM | POA: Diagnosis not present

## 2015-06-18 LAB — LIPID PANEL
CHOL/HDL RATIO: 5 ratio
Cholesterol: 105 mg/dL (ref 0–200)
HDL: 21 mg/dL — ABNORMAL LOW (ref >40)
LDL CALC: 64 mg/dL (ref 0–99)
Triglycerides: 102 mg/dL (ref <150)
VLDL: 20 mg/dL (ref 0–40)

## 2015-06-18 LAB — BASIC METABOLIC PANEL
Anion gap: 9 (ref 5–15)
BUN: 19 mg/dL (ref 6–20)
CHLORIDE: 105 mmol/L (ref 101–111)
CO2: 26 mmol/L (ref 22–32)
Calcium: 8.2 mg/dL — ABNORMAL LOW (ref 8.9–10.3)
Creatinine, Ser: 0.73 mg/dL (ref 0.61–1.24)
GFR calc non Af Amer: >60 mL/min (ref >60)
GLUCOSE: 81 mg/dL (ref 65–99)
POTASSIUM: 3.4 mmol/L — AB (ref 3.5–5.1)
SODIUM: 140 mmol/L (ref 135–145)

## 2015-06-18 LAB — CBC
HCT: 38.8 % — ABNORMAL LOW (ref 39.0–52.0)
Hemoglobin: 13.2 g/dL (ref 13.0–17.0)
MCH: 29.7 pg (ref 26.0–34.0)
MCHC: 34 g/dL (ref 30.0–36.0)
MCV: 87.2 fL (ref 78.0–100.0)
PLATELETS: 191 10*3/uL (ref 150–400)
RBC: 4.45 MIL/uL (ref 4.22–5.81)
RDW: 13.7 % (ref 11.5–15.5)
WBC: 13.1 10*3/uL — AB (ref 4.0–10.5)

## 2015-06-18 NOTE — Progress Notes (Signed)
Patient ID: HERNDON GRILL, male   DOB: 02-Jul-1946, 69 y.o.   MRN: [id number]    Patient Name: MARIS BENA Date of Encounter: 06/18/2015     Principal Problem:   Mobitz type 2 second degree AV block Active Problems:   DM2 (diabetes mellitus, type 2)   Hyperlipidemia   Essential hypertension   Symptomatic bradycardia   RBBB with left anterior fascicular block   Mobitz type 2 second degree atrioventricular block    SUBJECTIVE  Stable after PPM insertion. Minimal incisional pain.  CURRENT MEDS . amphetamine-dextroamphetamine  25 mg Oral [id number]  . aspirin EC  81 mg Oral Daily  .  ceFAZolin (ANCEF) IV  1 g Intravenous Q6H  . heparin  5,000 Units Subcutaneous 3 times per day  . lisinopril  20 mg Oral Daily  . simvastatin  40 mg Oral QHS    OBJECTIVE  Filed Vitals:   06/18/15 0500 06/18/15 0600 06/18/15 0700 06/18/15 0721  BP: 126/69 135/79 130/79   Pulse: 74 77 68   Temp:    97.9 F (36.6 C)  TempSrc:    Oral  Resp: Height:      Weight:      SpO2: 100% 97% 97%     Intake/Output Summary (Last 24 hours) at 06/18/15 0801 Last data filed at 06/18/15 0500  Gross per 24 hour  Intake    740 ml  Output      0 ml  Net    740 ml   Filed Weights   06/17/15 0949  Weight: 210 lb (95.255 kg)    PHYSICAL EXAM  General: Pleasant, NAD. Neuro: Alert and oriented X 3. Moves all extremities spontaneously. Psych: Normal affect. HEENT:  Normal  Neck: Supple without bruits or JVD. Lungs:  Resp regular and unlabored, CTA. Heart: RRR no s3, s4, or murmurs. Abdomen: Soft, non-tender, non-distended, BS + x 4.  Extremities: No clubbing, cyanosis or edema. DP/PT/Radials 2+ and equal bilaterally.  Accessory Clinical Findings  CBC  Recent Labs  06/17/15 0955 06/17/15 1405 06/18/15 0257  WBC 13.0* 11.4* 13.1*  NEUTROABS 8.7*  --   --   HGB 14.8 13.9 13.2  HCT 43.6 41.1 38.8*  MCV 87.9 87.3 87.2  PLT 241 189 191   Basic Metabolic Panel  Recent Labs  06/17/15 0955 06/17/15 1405 06/18/15 0257  NA 141  --  140  K 3.3*  --  3.4*  CL 105  --  105  CO2 26  --  26  GLUCOSE 143*  --  81  BUN 24*  --  19  CREATININE 0.78 0.84 0.73  CALCIUM 9.3  --  8.2*  MG 1.9  --   --   PHOS 3.6  --   --    Liver Function Tests No results for input(s): AST, ALT, ALKPHOS, BILITOT, PROT, ALBUMIN in the last 72 hours. No results for input(s): LIPASE, AMYLASE in the last 72 hours. Cardiac Enzymes  Recent Labs  06/17/15 1405 06/17/15 2145  TROPONINI <0.03 0.06*   BNP Invalid input(s): POCBNP D-Dimer No results for input(s): DDIMER in the last 72 hours. Hemoglobin A1C No results for input(s): HGBA1C in the last 72 hours. Fasting Lipid Panel  Recent Labs  06/18/15 0251  CHOL 105  HDL 21*  LDLCALC 64  TRIG 409  CHOLHDL 5.0   Thyroid Function Tests No results for input(s): TSH, T4TOTAL, T3FREE, THYROIDAB in the last 72 hours.  Invalid input(s): FREET3  ECG  nsr with ventricular pacing  Radiology/Studies  Dg Chest Port 1 View  06/18/2015   CLINICAL DATA:  Cardiac device in situ.  Shortness of breath.  EXAM: PORTABLE CHEST - 1 VIEW  COMPARISON:  10/19/2010  FINDINGS: Patient's left-sided transvenous pacemaker, leads to the right atrium and right ventricle. Heart size is normal. No pulmonary edema. There is minimal left subsegmental atelectasis. Otherwise no focal consolidations or pulmonary edema. Old right clavicle fracture.  IMPRESSION: 1. Minimal left lower lobe atelectasis. 2. No pulmonary edema.   Electronically Signed   By: Norva PavlovElizabeth  Brown M.D.   On: 06/18/2015 07:17    ASSESSMENT AND PLAN  1. ChB 2. S/p PPM Plan - observe today and discharge home tomorrow as per Dr. Graciela HusbandsKlein.   Amazing Cowman,M.D.  06/18/2015 8:01 AM

## 2015-06-19 ENCOUNTER — Encounter (HOSPITAL_COMMUNITY): Payer: Self-pay | Admitting: Physician Assistant

## 2015-06-19 MED ORDER — POTASSIUM CHLORIDE CRYS ER 20 MEQ PO TBCR
20.0000 meq | EXTENDED_RELEASE_TABLET | Freq: Once | ORAL | Status: AC
  Administered 2015-06-19: 20 meq via ORAL
  Filled 2015-06-19: qty 1

## 2015-06-19 MED ORDER — ASPIRIN 81 MG PO TBEC: 81.0000 mg | DELAYED_RELEASE_TABLET | Freq: Every day | ORAL | Status: DC

## 2015-06-19 NOTE — Discharge Summary (Signed)
Discharge Summary   Patient ID: Jeremy Abbott MRN: [id number], DOB/AGE: 05/11/1946 69 y.o. Admit date: 06/17/2015 D/C date:     06/19/2015  Primary Care Provider: No primary care provider on file. Primary Cardiologist: Graciela Husbands  Primary Discharge Diagnoses:  1. 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 HTN 3. Hypokalemia 4. Leukocytosis  PMH:  Past Medical History  Diagnosis Date  . Diabetes mellitus   . Hyperlipidemia   . Anxiety   . Adjustment disorder with anxiety   . Depression   . ADHD (attention deficit hyperactivity disorder)   . Essential hypertension   . Allergic rhinitis   . Fracture of right clavicle 11/09/2009    Chronic right collarbone pain-non union  . RECTAL BLEEDING 01/22/2008  . NSAID long-term use   . COPD (chronic obstructive pulmonary disease) 11/12/2011  . Complete heart block     a. s/p Medtronic PPM 06/17/15 (device dependent).  . Hypokalemia     Hospital Course: Jeremy Abbott is a 69 y/o M with history of HTN, type 2 diabetes, dyslipidemia, anxiety/depression, and COPD with a former smoking history who presented to [facility] on 06/17/15 with fatigue. He had noticed the night before he was feeling dizzy and fatigued, especially with a change in position. In the office he was found to have a heart rate in the 30s and was referred to the emergency department. He was not on any AV nodal blocking agents. BP was stable. EKG showed complete heart block with RBBB and LAFB, ventricular rate in the 30s. He was noted to be hypokalemic, possibly related to his HCTZ which was held. Mg 1.9. Labwork otherwise was notable for WBC 13k, first two trponins were negative then next set 0.06, felt possibly related to demand from low HR. He was afebrile. CXR showed LLL atx and no pulm edema. There were no reversible causes identified for his bradycardia. 2D echo 06/17/15 showed EF 60-65%,  no RWMA, mildly dilated RA, dilated IVC. He  underwent implantation of Medtronic MRI compatiblepulse generator serial number [id number]. Dr. Odessa Fleming note indicates this was complicated by transient asystole, and reported later on that the patient is device dependent. He was observed on telemetry afterwards and did well without adverse event. Dr. Ladona Ridgel has seen and examined the patient today and feels he is stable for discharge. Given BP is stable off HCTZ we will continue to hold this for now. He takes some form of aspirin daily due to taking Excedrin. Jeremy Abbott has already arranged f/u for the patient on 06/29/15 at 4pm. I also sent a msg to schedulers to arrange office f/u with Dr. Graciela Husbands in 3 months and a PA appt in 1-2 weeks given med change & minimally elevated trop this admission. Office will call him with these appts.  Of note - elevated WBC may be due to stress of heart block this admission but would consider repeating at follow-up to make sure it normalizes. May also consider rechecking BMET given low K this admission.  Discharge Vitals: Blood pressure 138/75, pulse 77, temperature 98 F (36.7 C), temperature source Oral, resp. rate 17, height 5\' 9"  (1.753 m), weight 210 lb (95.255 kg), SpO2 97 %.  Labs: Lab Results  Component Value Date   WBC 13.1* 06/18/2015   HGB 13.2 06/18/2015   HCT 38.8* 06/18/2015   MCV 87.2 06/18/2015   PLT 191 06/18/2015    Recent Labs Lab 06/18/15 0257  NA 140  K 3.4*  CL  105  CO2 26  BUN 19  CREATININE 0.73  CALCIUM 8.2*  GLUCOSE 81    Recent Labs  06/17/15 1405 06/17/15 2145  TROPONINI <0.03 0.06*   Lab Results  Component Value Date   CHOL 105 06/18/2015   HDL 21* 06/18/2015   LDLCALC 64 06/18/2015   TRIG 102 06/18/2015    Diagnostic Studies/Procedures   Dg Chest Port 1 View  06/18/2015   CLINICAL DATA:  Cardiac device in situ.  Shortness of breath.  EXAM: PORTABLE CHEST - 1 VIEW  COMPARISON:  10/19/2010  FINDINGS: Patient's left-sided transvenous pacemaker, leads to the  right atrium and right ventricle. Heart size is normal. No pulmonary edema. There is minimal left subsegmental atelectasis. Otherwise no focal consolidations or pulmonary edema. Old right clavicle fracture.  IMPRESSION: 1. Minimal left lower lobe atelectasis. 2. No pulmonary edema.   Electronically Signed   By: Norva Pavlov M.D.   On: 06/18/2015 07:17   Pacemaker implantation - see report 06/17/15  Discharge Medications   Current Discharge Medication List      CONTINUE these medications which have NOT CHANGED   Details  amphetamine-dextroamphetamine (ADDERALL XR) 25 MG 24 hr capsule Take 25 mg by mouth every morning.    aspirin-acetaminophen-caffeine (EXCEDRIN MIGRAINE) 250-250-65 MG per tablet Take 2 tablets by mouth every 6 (six) hours as needed for headache.    glucose blood (ONE TOUCH ULTRA TEST) test strip Once daily dx 250.00    HYDROcodone-acetaminophen (NORCO) 7.5-325 MG per tablet Take 1 tablet by mouth 4 (four) times daily as needed.     ibuprofen (ADVIL,MOTRIN) 200 MG tablet Take 200 mg by mouth 4 (four) times daily as needed.      lisinopril (PRINIVIL,ZESTRIL) 20 MG tablet Take 1 tablet (20 mg total) by mouth daily.     metFORMIN (GLUCOPHAGE XR) 500 MG 24 hr tablet TAKE 2 TABLETS EVERY AM.    Associated Diagnoses: Type II or unspecified type diabetes mellitus without mention of complication, not stated as uncontrolled    ONETOUCH DELICA LANCETS MISC Once daily dx 250.00 Once daily dx 250.00     sildenafil (VIAGRA) 100 MG tablet Take 1 tablet (100 mg total) by mouth daily as needed.     simvastatin (ZOCOR) 40 MG tablet Take 1 tablet (40 mg total) by mouth at bedtime.       STOP taking these medications     hydrochlorothiazide (HYDRODIURIL) 25 MG tablet      amitriptyline (ELAVIL) 50 MG tablet - med expired in 2014, removed from list         Disposition   The patient will be discharged in stable condition to home. Discharge Instructions    Diet - low  sodium heart healthy    Complete by:  As directed   Diabetic Diet     Increase activity slowly    Complete by:  As directed   Your hydrochlorothiazide was stopped due to low potassium level in the hospital. Please monitor your blood pressure occasionally at home. Call your doctor if you tend to get readings of greater than 130 on the top number or 80 on the bottom number. We will review your blood pressure at your follow-up appointment, and may also recheck labwork at that time.  Please see attached sheet at the end of your After-Visit Summary for instructions on wound care, activity, and bathing.          Follow-up Information    Follow up with [facility] On  06/29/2015.   Why:  at 4PM for wound check   Contact information:   [address] [place] [phone]       [facility] [address] [facility].   Specialty:  Cardiology   Why:  Office will call you for your followup appointment. You will see a PA/NP in about 1-2 weeks and Dr. Graciela HusbandsKlein in 3 months. Call office if you have not heard back in 3 days.   Contact information:   [address], [address] [place] [id number] [phone]        Duration of Discharge Encounter: Greater than 30 minutes including physician and PA time.  Thomasene MohairSigned, Dayna, Dunn PA-C 06/19/2015, 8:24 AM  EP attending  Patient seen and examined. I have reviewed the findings as documented above. I agree with the history, hospital course, medications, and conclusion. The patient is stable for discharge. His incision is sore today, and I have tried to reassure him that his discomfort will improve.  Lewayne BuntingGregg Taylor, M.D.

## 2015-06-19 NOTE — Progress Notes (Signed)
Patient asked if he could be allowed to get some sleep tonight. "Last night lab people came in at 0245 and I could not get back to sleep." Note posted on door to call RN before entering. Patient is 100% V paced with HR 75. Will continue to monitor patient without disturbing him.

## 2015-06-19 NOTE — Discharge Instructions (Signed)
° ° °  Supplemental Discharge Instructions for  Pacemaker Patients  Activity No heavy lifting or vigorous activity with your left/right arm for 6 to 8 weeks.  Do not raise your left/right arm above your head for one week.  Gradually raise your affected arm as drawn below.           __       06/22/15                     06/23/15                  06/24/15                    06/25/15  NO DRIVING for  1 week   ; you may begin driving on   9/60/457/23/16  .  WOUND CARE - Keep the wound area clean and dry.  Do not get this area wet for one week. No showers for one week; you may shower on 06/25/15    . - The tape/steri-strips on your wound will fall off; do not pull them off.  No bandage is needed on the site.  DO  NOT apply any creams, oils, or ointments to the wound area. - If you notice any drainage or discharge from the wound, any swelling or bruising at the site, or you develop a fever > 101? F after you are discharged home, call the office at once.  Special Instructions - You are still able to use cellular telephones; use the ear opposite the side where you have your pacemaker/defibrillator.  Avoid carrying your cellular phone near your device. - When traveling through airports, show security personnel your identification card to avoid being screened in the metal detectors.  Ask the security personnel to use the hand wand. - Avoid arc welding equipment, MRI testing (magnetic resonance imaging), TENS units (transcutaneous nerve stimulators).  Call the office for questions about other devices. - Avoid electrical appliances that are in poor condition or are not properly grounded. - Microwave ovens are safe to be near or to operate.

## 2015-06-20 ENCOUNTER — Encounter (HOSPITAL_COMMUNITY): Payer: Self-pay | Admitting: Internal Medicine

## 2015-06-20 MED FILL — Sodium Chloride Irrigation Soln 0.9%: Qty: 500 | Status: AC

## 2015-06-20 MED FILL — Lidocaine HCl Local Preservative Free (PF) Inj 1%: INTRAMUSCULAR | Qty: 60 | Status: AC

## 2015-06-20 MED FILL — Cefazolin Sodium for IV Soln 2 GM and Dextrose 3% (50 ML): INTRAVENOUS | Qty: 50 | Status: AC

## 2015-06-20 MED FILL — Heparin Sodium (Porcine) 2 Unit/ML in Sodium Chloride 0.9%: INTRAMUSCULAR | Qty: 500 | Status: AC

## 2015-06-20 MED FILL — Gentamicin Sulfate Inj 40 MG/ML: INTRAMUSCULAR | Qty: 2 | Status: AC

## 2015-06-29 ENCOUNTER — Ambulatory Visit (INDEPENDENT_AMBULATORY_CARE_PROVIDER_SITE_OTHER): Payer: Federal, State, Local not specified - PPO | Admitting: *Deleted

## 2015-06-29 ENCOUNTER — Encounter: Payer: Self-pay | Admitting: Physician Assistant

## 2015-06-29 ENCOUNTER — Ambulatory Visit (INDEPENDENT_AMBULATORY_CARE_PROVIDER_SITE_OTHER): Payer: Federal, State, Local not specified - PPO | Admitting: Physician Assistant

## 2015-06-29 VITALS — BP 160/70 | HR 80 | Ht 69.0 in | Wt 213.0 lb

## 2015-06-29 DIAGNOSIS — I10 Essential (primary) hypertension: Secondary | ICD-10-CM | POA: Diagnosis not present

## 2015-06-29 DIAGNOSIS — I442 Atrioventricular block, complete: Secondary | ICD-10-CM | POA: Diagnosis not present

## 2015-06-29 DIAGNOSIS — I441 Atrioventricular block, second degree: Secondary | ICD-10-CM

## 2015-06-29 DIAGNOSIS — E785 Hyperlipidemia, unspecified: Secondary | ICD-10-CM | POA: Diagnosis not present

## 2015-06-29 MED ORDER — HYDROCHLOROTHIAZIDE 25 MG PO TABS: 25.0000 mg | ORAL_TABLET | Freq: Every day | ORAL | Status: DC

## 2015-06-29 NOTE — Assessment & Plan Note (Signed)
Blood pressure is elevated 160/70. Does have significant amount of lower extremity edema as well which was better controlled while on HCTZ. We'll go ahead and restart this. His potassium was checked in the hospital was only 3.3. I recommended he increase his fruit and vegetable intake to get this potassium level more appropriate while he is on HCTZ.

## 2015-06-29 NOTE — Patient Instructions (Signed)
Medication Instructions:   Your physician recommends that you continue on your current medications as directed. Please refer to the Current Medication list given to you today.    Labwork:   Testing/Procedures:   Follow-Up:   Any Other Special Instructions Will Be Listed Below (If Applicable).   

## 2015-06-29 NOTE — Progress Notes (Signed)
Patient ID: Jeremy Abbott, male   DOB: 1946-01-09, 69 y.o.   MRN: [id number]    Date:  06/29/2015   ID:  Jeremy Abbott, Jeremy Abbott 1946-05-03, MRN [id number]  PCP:  Jeremy Patella, MD  Primary Cardiologist:  Graciela Husbands  Chief Complaint  Patient presents with  . Anxiety     History of Present Illness: Jeremy Abbott is a 69 y.o. male with history of HTN, type 2 diabetes, dyslipidemia, anxiety/depression, and COPD with a former smoking history who presented to [facility] on 06/17/15 with fatigue.  he was found to have a heart rate in the 30s and was referred to the emergency department. He was not on any AV nodal blocking agents. BP was stable. EKG showed complete heart block with RBBB and LAFB, ventricular rate in the 30s. He was noted to be hypokalemic, possibly related to his HCTZ which was held. Mg 1.9. Labwork otherwise was notable for WBC 13k, first two troponins were negative then next set 0.06, felt possibly related to demand from low HR. He was afebrile. CXR showed LLL atx and no pulm edema. There were no reversible causes identified for his bradycardia. 2D echo 06/17/15 showed EF 60-65%, no RWMA, mildly dilated RA, dilated IVC. He underwent implantation of Medtronic MRI compatiblepulse generator serial number [id number]. Dr. Odessa Fleming note indicates this was complicated by transient asystole, and reported later that the patient is device dependent.   The patient presents for post-hospital follow-up. He reports feeling a lot better since pacemaker was implanted.   He does have a fair amount of lower extremity edema which she contributes did not keep his feet up when he is stationary. There is likely little bit worse since he has been off his HCTZ.   He did describe for moderate pain after the hospitalization.  The patient currently denies nausea, vomiting, fever, chest pain, shortness of breath, orthopnea, dizziness, PND, cough, congestion, abdominal pain, hematochezia, melena, lower  extremity edema, claudication.  Wt Readings from Last 3 Encounters:  06/29/15 213 lb (96.616 kg)  06/17/15 210 lb (95.255 kg)  11/13/12 223 lb 4 oz (101.266 kg)     Past Medical History  Diagnosis Date  . Diabetes mellitus   . Hyperlipidemia   . Anxiety   . Adjustment disorder with anxiety   . Depression   . ADHD (attention deficit hyperactivity disorder)   . Essential hypertension   . Allergic rhinitis   . Fracture of right clavicle 11/09/2009    Chronic right collarbone pain-non union  . RECTAL BLEEDING 01/22/2008  . NSAID long-term use   . COPD (chronic obstructive pulmonary disease) 11/12/2011  . Complete heart block     a. s/p Medtronic PPM 06/17/15 (device dependent).  . Hypokalemia     Current Outpatient Prescriptions  Medication Sig Dispense Refill  . amphetamine-dextroamphetamine (ADDERALL XR) 25 MG 24 hr capsule Take 25 mg by mouth every morning.    Marland Kitchen aspirin-acetaminophen-caffeine (EXCEDRIN MIGRAINE) 250-250-65 MG per tablet Take 2 tablets by mouth every 6 (six) hours as needed for headache.    . glucose blood (ONE TOUCH ULTRA TEST) test strip Once daily dx 250.00 100 each 11  . hydrochlorothiazide (HYDRODIURIL) 25 MG tablet Take 1 tablet (25 mg total) by mouth daily. 30 tablet 6  . HYDROcodone-acetaminophen (NORCO) 7.5-325 MG per tablet Take 1 tablet by mouth 4 (four) times daily as needed. (Patient taking differently: Take 1 tablet by mouth 4 (four) times daily as needed for severe pain. ) 120 tablet  0  . ibuprofen (ADVIL,MOTRIN) 200 MG tablet Take 200 mg by mouth 4 (four) times daily as needed for moderate pain.     Marland Kitchen lisinopril (PRINIVIL,ZESTRIL) 20 MG tablet Take 1 tablet (20 mg total) by mouth daily. 90 tablet 3  . metFORMIN (GLUCOPHAGE XR) 500 MG 24 hr tablet TAKE 2 TABLETS EVERY AM. (Patient taking differently: Take 1,000 mg by mouth every morning. ) 180 tablet 3  . ONETOUCH DELICA LANCETS MISC Once daily dx 250.00 Once daily dx 250.00 100 each 11  . sildenafil  (VIAGRA) 100 MG tablet Take 1 tablet (100 mg total) by mouth daily as needed. (Patient taking differently: Take 100 mg by mouth daily as needed for erectile dysfunction. ) 10 tablet 11  . simvastatin (ZOCOR) 40 MG tablet Take 1 tablet (40 mg total) by mouth at bedtime. 90 tablet 3   No current facility-administered medications for this visit.    Allergies:   No Known Allergies  Social History:  The patient  reports that he has quit smoking. He does not have any smokeless tobacco history on file. He reports that he does not drink alcohol or use illicit drugs.   Family history:   Family History  Problem Relation Age of Onset  . Diabetes Daughter   . Heart failure Father   . Heart attack Neg Hx   . Stroke Neg Hx     ROS:  Please see the history of present illness.  All other systems reviewed and negative.   PHYSICAL EXAM: VS:  BP 160/70 mmHg  Pulse 80  Ht 5\' 9"  (1.753 m)  Wt 213 lb (96.616 kg)  BMI 31.44 kg/m2  SpO2 97% obese, well developed, in no acute distress HEENT: Pupils are equal round react to light accommodation extraocular movements are intact.  Cardiac: Regular rate and rhythm without murmurs rubs or gallops. Lungs:  clear to auscultation bilaterally, no wheezing, rhonchi or rales Ext: 2+ lower extremity edema.  2+ radial and dorsalis pedis pulses. Skin: warm and dry.   No erythema or edema at the pacer site. Neuro:  Grossly normal   ASSESSMENT AND PLAN:   Problem List Items Addressed This Visit    Mobitz type 2 second degree atrioventricular block - Primary     Status post permanent pacemaker implant. Patient is pacemaker dependent. His wound is healing well with no signs of infection.   He follows up with Dr. Graciela Husbands in September.      Relevant Medications   hydrochlorothiazide (HYDRODIURIL) 25 MG tablet   Hyperlipidemia     Continue statin.  Lipid Panel     Component Value Date/Time   CHOL 105 06/18/2015 0251   TRIG 102 06/18/2015 0251   HDL 21*  06/18/2015 0251   CHOLHDL 5.0 06/18/2015 0251   VLDL 20 06/18/2015 0251   LDLCALC 64 06/18/2015 0251   LDLDIRECT 102.9 10/19/2010 0905          Relevant Medications   hydrochlorothiazide (HYDRODIURIL) 25 MG tablet   Essential hypertension    Blood pressure is elevated 160/70. Does have significant amount of lower extremity edema as well which was better controlled while on HCTZ. We'll go ahead and restart this. His potassium was checked in the hospital was only 3.3. I recommended he increase his fruit and vegetable intake to get this potassium level more appropriate while he is on HCTZ.       Relevant Medications   hydrochlorothiazide (HYDRODIURIL) 25 MG tablet

## 2015-06-29 NOTE — Assessment & Plan Note (Signed)
Status post permanent pacemaker implant. Patient is pacemaker dependent. His wound is healing well with no signs of infection.   He follows up with Dr. Graciela Husbands in September.

## 2015-06-29 NOTE — Assessment & Plan Note (Addendum)
Continue statin.  Lipid Panel     Component Value Date/Time   CHOL 105 06/18/2015 0251   TRIG 102 06/18/2015 0251   HDL 21* 06/18/2015 0251   CHOLHDL 5.0 06/18/2015 0251   VLDL 20 06/18/2015 0251   LDLCALC 64 06/18/2015 0251   LDLDIRECT 102.9 10/19/2010 0905

## 2015-06-30 LAB — CUP PACEART INCLINIC DEVICE CHECK
Brady Statistic AP VP Percent: 0.56 %
Brady Statistic AP VS Percent: 0 %
Brady Statistic AS VP Percent: 99.4 %
Brady Statistic AS VS Percent: 0.05 %
Brady Statistic RA Percent Paced: 0.56 %
Date Time Interrogation Session: 20160727202639
Lead Channel Impedance Value: 361 Ohm
Lead Channel Impedance Value: 437 Ohm
Lead Channel Impedance Value: 608 Ohm
Lead Channel Pacing Threshold Amplitude: 0.75 V
Lead Channel Pacing Threshold Amplitude: 1 V
Lead Channel Pacing Threshold Pulse Width: 0.4 ms
Lead Channel Pacing Threshold Pulse Width: 0.4 ms
Lead Channel Sensing Intrinsic Amplitude: 15.875 mV
Lead Channel Sensing Intrinsic Amplitude: 4.25 mV
Lead Channel Setting Pacing Amplitude: 3.5 V
Lead Channel Setting Pacing Amplitude: 3.5 V
Lead Channel Setting Pacing Pulse Width: 0.4 ms
Lead Channel Setting Sensing Sensitivity: 4 mV
MDC IDC MSMT BATTERY VOLTAGE: 3.11 V
MDC IDC MSMT LEADCHNL RV IMPEDANCE VALUE: 513 Ohm
MDC IDC STAT BRADY RV PERCENT PACED: 99.95 %
Zone Setting Detection Interval: 400 ms
Zone Setting Detection Interval: 400 ms

## 2015-08-10 ENCOUNTER — Encounter: Payer: Self-pay | Admitting: Internal Medicine

## 2015-08-23 ENCOUNTER — Encounter: Payer: Self-pay | Admitting: Internal Medicine

## 2015-09-29 ENCOUNTER — Encounter: Payer: Federal, State, Local not specified - PPO | Admitting: Internal Medicine

## 2015-10-04 ENCOUNTER — Encounter: Payer: Self-pay | Admitting: Internal Medicine

## 2015-10-04 ENCOUNTER — Ambulatory Visit (INDEPENDENT_AMBULATORY_CARE_PROVIDER_SITE_OTHER): Payer: Federal, State, Local not specified - PPO | Admitting: Internal Medicine

## 2015-10-04 VITALS — BP 130/90 | HR 91 | Ht 68.0 in | Wt 210.4 lb

## 2015-10-04 DIAGNOSIS — I452 Bifascicular block: Secondary | ICD-10-CM

## 2015-10-04 DIAGNOSIS — I442 Atrioventricular block, complete: Secondary | ICD-10-CM

## 2015-10-04 DIAGNOSIS — I441 Atrioventricular block, second degree: Secondary | ICD-10-CM

## 2015-10-04 NOTE — Progress Notes (Signed)
Patient Care Team: Elias Else, MD as PCP - General (Family Medicine)   HPI  Jeremy Abbott is a 69 y.o. male Seen in followup for PM implanted 7/16 for CHB.  Medtronic  echo 06/17/15 showed EF 60-65%, no RWMA, mildly dilated RA, dilated IVC  Hx of HTN, DM    Past Medical History  Diagnosis Date  . Diabetes mellitus (HCC)   . Hyperlipidemia   . Anxiety   . Adjustment disorder with anxiety   . Depression   . ADHD (attention deficit hyperactivity disorder)   . Essential hypertension   . Allergic rhinitis   . Fracture of right clavicle 11/09/2009    Chronic right collarbone pain-non union  . RECTAL BLEEDING 01/22/2008  . NSAID long-term use   . COPD (chronic obstructive pulmonary disease) (HCC) 11/12/2011  . Complete heart block (HCC)     a. s/p Medtronic PPM 06/17/15 (device dependent).  . Hypokalemia     Past Surgical History  Procedure Laterality Date  . Tonsillectomy    . Orif clavicle fracture  2/08    s/p collarbone fx with Fall-Murphy Wainer  . Ep implantable device N/A 06/17/2015    Procedure: Pacemaker Implant;  Surgeon: Duke Salvia, MD;  Location: [facility] INVASIVE CV LAB;  Service: Cardiovascular;  Laterality: N/A;    Current Outpatient Prescriptions  Medication Sig Dispense Refill  . amphetamine-dextroamphetamine (ADDERALL XR) 25 MG 24 hr capsule Take 25 mg by mouth every morning.    Marland Kitchen aspirin-acetaminophen-caffeine (EXCEDRIN MIGRAINE) 250-250-65 MG per tablet Take 2 tablets by mouth every 6 (six) hours as needed for headache.    . glucose blood (ONE TOUCH ULTRA TEST) test strip Once daily dx 250.00 100 each 11  . HYDROcodone-acetaminophen (NORCO) 7.5-325 MG tablet Take 1 tablet by mouth 4 (four) times daily as needed (pain).    Marland Kitchen ibuprofen (ADVIL,MOTRIN) 200 MG tablet Take 200 mg by mouth 4 (four) times daily as needed for moderate pain.     Marland Kitchen lisinopril (PRINIVIL,ZESTRIL) 20 MG tablet Take 1 tablet (20 mg total) by mouth daily. 90 tablet 3  .  metFORMIN (GLUCOPHAGE-XR) 500 MG 24 hr tablet Take 100 mg by mouth every morning.    [name] DELICA LANCETS MISC Once daily dx 250.00 Once daily dx 250.00 100 each 11  . sildenafil (VIAGRA) 100 MG tablet Take 1 tablet (100 mg total) by mouth daily as needed. (Patient taking differently: Take 100 mg by mouth daily as needed for erectile dysfunction. ) 10 tablet 11  . simvastatin (ZOCOR) 40 MG tablet Take 1 tablet (40 mg total) by mouth at bedtime. 90 tablet 3   No current facility-administered medications for this visit.    No Known Allergies    Review of Systems negative except from HPI and PMH  Physical Exam BP 130/90 mmHg  Pulse 91  Ht  (1.727 m)  Wt 210 lb 6.4 oz (95.437 kg)  BMI 32.00 kg/m2 Well developed and well nourished in no acute distress HENT normal E scleral and icterus clear Neck Supple JVP flat; carotids brisk and full Clear to ausculation Device pocket well healed; without hematoma or erythema.  There is no tethering  Regular rate and rhythm, no murmurs gallops or rub Soft with active bowel sounds No clubbing cyanosis  Edema Alert and oriented, grossly normal motor and sensory function Skin Warm and Dry  ECG P-synchronous/ AV  pacing   Assessment and  Plan  Complete heart block  Medtronic pacemaker  High ventricular threshold  The patient has an exceedingly high ventricular thresholds. We have reprogrammed the device to limit the voltage to 2.5. We will interrogate his device next week so we get some idea of what his new battery longevity will be. Currently is estimated 3 years.  Otherwise is stable

## 2015-10-04 NOTE — Patient Instructions (Addendum)
Medication Instructions: - no changes  Labwork: - none  Procedures/Testing: - none  Follow-Up: - Remote monitoring is used to monitor your Pacemaker of ICD from home. This monitoring reduces the number of office visits required to check your device to one time per year. It allows us to keep an eye on the functioning of your device to ensure it is working properly. You are scheduled for a device check from home on 10/11/15. You may send your transmission at any time that day. If you have a wireless device, the transmission will be sent automatically. After your physician reviews your transmission, you will receive a postcard with your next transmission date.  - Remote monitoring is used to monitor your Pacemaker of ICD from home. This monitoring reduces the number of office visits required to check your device to one time per year. It allows us to keep an eye on the functioning of your device to ensure it is working properly. You are scheduled for a device check from home on 01/14/16. You may send your transmission at any time that day. If you have a wireless device, the transmission will be sent automatically. After your physician reviews your transmission, you will receive a postcard with your next transmission date.  - Your physician wants you to follow-up in: 9 months with Dr. Graciela HusbandsKlein (August 2017).  You will receive a reminder letter in the mail two months in advance. If you don't receive a letter, please call our office to schedule the follow-up appointment.  Any Additional Special Instructions Will Be Listed Below (If Applicable). - none

## 2015-10-07 LAB — CUP PACEART INCLINIC DEVICE CHECK
Battery Voltage: 2.99 V
Brady Statistic AS VP Percent: 99.71 %
Brady Statistic RA Percent Paced: 0.27 %
Date Time Interrogation Session: 20161101181344
Implantable Lead Implant Date: 20160715
Implantable Lead Location: 753860
Implantable Lead Model: 5076
Implantable Lead Model: 5076
Lead Channel Impedance Value: 399 Ohm
Lead Channel Impedance Value: 494 Ohm
Lead Channel Pacing Threshold Amplitude: 0.75 V
Lead Channel Pacing Threshold Pulse Width: 0.4 ms
Lead Channel Pacing Threshold Pulse Width: 0.4 ms
Lead Channel Sensing Intrinsic Amplitude: 4.375 mV
Lead Channel Setting Pacing Amplitude: 2.5 V
Lead Channel Setting Sensing Sensitivity: 4 mV
MDC IDC LEAD IMPLANT DT: 20160715
MDC IDC LEAD LOCATION: 753859
MDC IDC MSMT BATTERY REMAINING LONGEVITY: 38 mo
MDC IDC MSMT LEADCHNL RA IMPEDANCE VALUE: 342 Ohm
MDC IDC MSMT LEADCHNL RA IMPEDANCE VALUE: 494 Ohm
MDC IDC MSMT LEADCHNL RV PACING THRESHOLD AMPLITUDE: 2.5 V
MDC IDC MSMT LEADCHNL RV SENSING INTR AMPL: 5.5 mV
MDC IDC SET LEADCHNL RA PACING AMPLITUDE: 1.5 V
MDC IDC SET LEADCHNL RV PACING PULSEWIDTH: 1.2 ms
MDC IDC STAT BRADY AP VP PERCENT: 0.27 %
MDC IDC STAT BRADY AP VS PERCENT: 0 %
MDC IDC STAT BRADY AS VS PERCENT: 0.03 %
MDC IDC STAT BRADY RV PERCENT PACED: 99.97 %

## 2015-10-11 ENCOUNTER — Ambulatory Visit (INDEPENDENT_AMBULATORY_CARE_PROVIDER_SITE_OTHER): Payer: Federal, State, Local not specified - PPO | Admitting: *Deleted

## 2015-10-11 ENCOUNTER — Telehealth: Payer: Self-pay | Admitting: Cardiology

## 2015-10-11 DIAGNOSIS — I442 Atrioventricular block, complete: Secondary | ICD-10-CM

## 2015-10-11 NOTE — Telephone Encounter (Signed)
LMOVM reminding pt to send remote transmission.   

## 2015-10-11 NOTE — Progress Notes (Signed)
Remote pacemaker transmission.   

## 2015-10-12 LAB — CUP PACEART REMOTE DEVICE CHECK
Battery Remaining Longevity: 42 mo
Battery Voltage: 3.02 V
Brady Statistic AP VP Percent: 0.2 %
Brady Statistic RA Percent Paced: 0.2 %
Brady Statistic RV Percent Paced: 99.95 %
Implantable Lead Implant Date: 20160715
Implantable Lead Location: 753859
Implantable Lead Location: 753860
Implantable Lead Model: 5076
Lead Channel Impedance Value: 399 Ohm
Lead Channel Impedance Value: 494 Ohm
Lead Channel Pacing Threshold Amplitude: 0.75 V
Lead Channel Pacing Threshold Pulse Width: 0.4 ms
Lead Channel Sensing Intrinsic Amplitude: 3.875 mV
Lead Channel Setting Pacing Amplitude: 1.75 V
Lead Channel Setting Pacing Amplitude: 2.5 V
Lead Channel Setting Pacing Pulse Width: 1.2 ms
MDC IDC LEAD IMPLANT DT: 20160715
MDC IDC MSMT LEADCHNL RA IMPEDANCE VALUE: 342 Ohm
MDC IDC MSMT LEADCHNL RA IMPEDANCE VALUE: 494 Ohm
MDC IDC MSMT LEADCHNL RA SENSING INTR AMPL: 3.875 mV
MDC IDC MSMT LEADCHNL RV PACING THRESHOLD AMPLITUDE: 2.5 V
MDC IDC MSMT LEADCHNL RV PACING THRESHOLD PULSEWIDTH: 0.4 ms
MDC IDC MSMT LEADCHNL RV SENSING INTR AMPL: 15.875 mV
MDC IDC MSMT LEADCHNL RV SENSING INTR AMPL: 15.875 mV
MDC IDC SESS DTM: 20161108183728
MDC IDC SET LEADCHNL RV SENSING SENSITIVITY: 4 mV
MDC IDC STAT BRADY AP VS PERCENT: 0 %
MDC IDC STAT BRADY AS VP PERCENT: 99.75 %
MDC IDC STAT BRADY AS VS PERCENT: 0.05 %

## 2015-10-14 ENCOUNTER — Encounter: Payer: Self-pay | Admitting: Cardiology

## 2015-10-20 ENCOUNTER — Encounter: Payer: Self-pay | Admitting: Cardiology

## 2015-10-20 ENCOUNTER — Telehealth: Payer: Self-pay | Admitting: Cardiology

## 2015-10-20 NOTE — Telephone Encounter (Signed)
Patient returned phone call.  Updated patient and explained that I will still call him back tomorrow after speaking with Dr. Graciela HusbandsKlein.  Patient is agreeable to this plan and is appreciative of call.

## 2015-10-20 NOTE — Telephone Encounter (Signed)
Spoke with patient about device battery life.  Patient is concerned because he states he was told at his most recent appointment on 10/04/15 that his PPM battery "should give me 6 years or more" but that his most recent MyChart message with a device update from a remote transmission on 10/11/15 states that his battery "only has 3.5 years left".  He states that his device was reprogrammed to give him more battery at this appointment, so he does not understand why the battery life only increased by a few months.  Patient states that overall he feels much better since implant and states that he does not notice the sensation of his heart "beating" (? V-pacing sensation) as much as he did prior to his appointment on 10/04/15.  He reiterates that he is concerned that he "was told one thing, but something else turns out to be true".  Advised patient that I will discuss his situation with Dr. Graciela HusbandsKlein tomorrow when he is in office and call the patient back.  Advised him to call with any worsening symptoms or questions in the meantime.  Patient verbalizes understanding of all instructions.

## 2015-10-20 NOTE — Telephone Encounter (Signed)
Pt called wanting to know what the status of his battery life is. Informed pt according to 10-11-15 remote transmission it is 10-11-15 it was 3.6 years. Pt stated that at last OV w/ MD they made changing's in hope of increasing his battery life. Pt wanted to speak with MD. Call forwarded to device tech RN.

## 2015-10-20 NOTE — Telephone Encounter (Signed)
LMOVM requesting call back from patient to update him.  Spoke with [employer] tech services rep, who advised that in [employer] MRI devices, the battery longevity estimation curve may take up to one year to initialize per Psychologist, counsellingMedtronic engineers.  She states that the estimate will become more accurate with each check and that this is due to the new type of batteries in use.  Battery voltage of patient's device is 3.02V, which is beginning of life.  Tech services rep used a Estate agentMedtronic battery longevity calculator, which takes into account patient's programming and %paced, and it estimates patient's battery longevity until ERI at 8.01 years from today.  Will update patient when he returns call and will make Dr. Graciela HusbandsKlein aware.

## 2015-10-21 NOTE — Telephone Encounter (Signed)
LMOM requesting call back

## 2015-10-24 NOTE — Telephone Encounter (Signed)
Late entry from 10/21/15:  Patient returned call.  Advised patient that Dr. Graciela HusbandsKlein is aware of patient's updated battery longevity estimate and that we will continue to monitor via remote transmissions.  Patient appreciative of call and is agreeable to plan.  He denies any additional questions or concerns at this time.

## 2016-01-10 ENCOUNTER — Ambulatory Visit (INDEPENDENT_AMBULATORY_CARE_PROVIDER_SITE_OTHER): Payer: Federal, State, Local not specified - PPO | Admitting: *Deleted

## 2016-01-10 ENCOUNTER — Telehealth: Payer: Self-pay | Admitting: Cardiology

## 2016-01-10 DIAGNOSIS — I442 Atrioventricular block, complete: Secondary | ICD-10-CM | POA: Diagnosis not present

## 2016-01-10 NOTE — Telephone Encounter (Signed)
LMOVM reminding pt to send remote transmission.   

## 2016-01-10 NOTE — Progress Notes (Signed)
Remote pacemaker transmission.   

## 2016-02-04 LAB — CUP PACEART REMOTE DEVICE CHECK
Battery Remaining Longevity: 88 mo
Battery Voltage: 3.01 V
Brady Statistic AP VP Percent: 0.08 %
Brady Statistic AP VS Percent: 0 %
Brady Statistic AS VS Percent: 0.11 %
Date Time Interrogation Session: 20170207200940
Implantable Lead Implant Date: 20160715
Implantable Lead Location: 753859
Implantable Lead Model: 5076
Lead Channel Impedance Value: 380 Ohm
Lead Channel Impedance Value: 475 Ohm
Lead Channel Pacing Threshold Amplitude: 0.75 V
Lead Channel Pacing Threshold Amplitude: 2.5 V
Lead Channel Sensing Intrinsic Amplitude: 4 mV
Lead Channel Sensing Intrinsic Amplitude: 4 mV
Lead Channel Setting Pacing Amplitude: 1.5 V
Lead Channel Setting Sensing Sensitivity: 4 mV
MDC IDC LEAD IMPLANT DT: 20160715
MDC IDC LEAD LOCATION: 753860
MDC IDC MSMT LEADCHNL RA IMPEDANCE VALUE: 342 Ohm
MDC IDC MSMT LEADCHNL RA PACING THRESHOLD PULSEWIDTH: 0.4 ms
MDC IDC MSMT LEADCHNL RV IMPEDANCE VALUE: 494 Ohm
MDC IDC MSMT LEADCHNL RV PACING THRESHOLD PULSEWIDTH: 0.4 ms
MDC IDC MSMT LEADCHNL RV SENSING INTR AMPL: 14.75 mV
MDC IDC MSMT LEADCHNL RV SENSING INTR AMPL: 14.75 mV
MDC IDC SET LEADCHNL RV PACING AMPLITUDE: 2.5 V
MDC IDC SET LEADCHNL RV PACING PULSEWIDTH: 1.2 ms
MDC IDC STAT BRADY AS VP PERCENT: 99.81 %
MDC IDC STAT BRADY RA PERCENT PACED: 0.08 %
MDC IDC STAT BRADY RV PERCENT PACED: 99.89 %

## 2016-02-04 NOTE — Progress Notes (Signed)
Normal remote reviewed.  Next Carelink 04-10-16 

## 2016-02-05 ENCOUNTER — Encounter: Payer: Self-pay | Admitting: Cardiology

## 2016-02-17 ENCOUNTER — Telehealth: Payer: Self-pay | Admitting: Cardiology

## 2016-02-17 NOTE — Telephone Encounter (Signed)
Spoke w/ pt and I informed him that I emailed him a note informing him of his next transmission date. I informed him that those e mails or letters will not contain the results. I read the paceart note to the patient and I informed him of his battery life. Pt also wanted to know what he needed to do when traveling to [place]. I informed pt to make sure he has his ID card and to show that to airport security and they should be able to direct him to the right station. I informed him that [facility] staff will also know what do w/ his Medtronic ID card. Pt verbalized understanding.

## 2016-02-22 ENCOUNTER — Encounter: Payer: Self-pay | Admitting: Cardiology

## 2016-04-10 ENCOUNTER — Telehealth: Payer: Self-pay | Admitting: Cardiology

## 2016-04-10 ENCOUNTER — Encounter: Payer: Federal, State, Local not specified - PPO | Admitting: *Deleted

## 2016-04-10 NOTE — Telephone Encounter (Signed)
LMOVM reminding pt to send remote transmission.   

## 2016-04-13 ENCOUNTER — Encounter: Payer: Self-pay | Admitting: Cardiology

## 2016-07-02 ENCOUNTER — Encounter: Payer: Self-pay | Admitting: Internal Medicine

## 2016-07-02 ENCOUNTER — Ambulatory Visit (INDEPENDENT_AMBULATORY_CARE_PROVIDER_SITE_OTHER): Payer: Federal, State, Local not specified - PPO | Admitting: Internal Medicine

## 2016-07-02 VITALS — BP 142/76 | HR 89 | Ht 69.0 in | Wt 218.4 lb

## 2016-07-02 DIAGNOSIS — Z950 Presence of cardiac pacemaker: Secondary | ICD-10-CM

## 2016-07-02 DIAGNOSIS — I442 Atrioventricular block, complete: Secondary | ICD-10-CM

## 2016-07-02 NOTE — Patient Instructions (Signed)
Medication Instructions: - Your physician recommends that you continue on your current medications as directed. Please refer to the Current Medication list given to you today.  Labwork: - none  Procedures/Testing: - none  Follow-Up: - Remote monitoring is used to monitor your Pacemaker of ICD from home. This monitoring reduces the number of office visits required to check your device to one time per year. It allows [place] to keep an eye on the functioning of your device to ensure it is working properly. You are scheduled for a device check from home on 10/01/16. You may send your transmission at any time that day. If you have a wireless device, the transmission will be sent automatically. After your physician reviews your transmission, you will receive a postcard with your next transmission date.  - Your physician wants you to follow-up in: 1 year with Gypsy Balsam, NP. You will receive a reminder letter in the mail two months in advance. If you don't receive a letter, please call our office to schedule the follow-up appointment.  Any Additional Special Instructions Will Be Listed Below (If Applicable).     If you need a refill on your cardiac medications before your next appointment, please call your pharmacy.

## 2016-07-02 NOTE — Progress Notes (Signed)
Patient Care Team: Elias Else, MD as PCP - General (Family Medicine)   HPI  Jeremy Abbott is a 70 y.o. male Seen in followup for PM implanted 7/16 for CHB.  Medtronic complicated by high left ventricular pacing thresholds.  He denies shortness of breath. He does have some peripheral edema. He is able to climb stairs.  echo 06/17/15 showed EF 60-65%, no RWMA, mildly dilated RA, dilated IVC  Hx of HTN, DM    Past Medical History:  Diagnosis Date  . ADHD (attention deficit hyperactivity disorder)   . Adjustment disorder with anxiety   . Allergic rhinitis   . Anxiety   . Complete heart block (HCC)    a. s/p Medtronic PPM 06/17/15 (device dependent).  . COPD (chronic obstructive pulmonary disease) (HCC) 11/12/2011  . Depression   . Diabetes mellitus (HCC)   . Essential hypertension   . Fracture of right clavicle 11/09/2009   Chronic right collarbone pain-non union  . Hyperlipidemia   . Hypokalemia   . NSAID long-term use   . RECTAL BLEEDING 01/22/2008    Past Surgical History:  Procedure Laterality Date  . EP IMPLANTABLE DEVICE N/A 06/17/2015   Procedure: Pacemaker Implant;  Surgeon: Duke Salvia, MD;  Location: [facility] INVASIVE CV LAB;  Service: Cardiovascular;  Laterality: N/A;  . ORIF CLAVICLE FRACTURE  2/08   s/p collarbone fx with Will Bonnet  . TONSILLECTOMY      Current Outpatient Prescriptions  Medication Sig Dispense Refill  . amphetamine-dextroamphetamine (ADDERALL XR) 25 MG 24 hr capsule Take 25 mg by mouth every morning.    Marland Kitchen aspirin-acetaminophen-caffeine (EXCEDRIN MIGRAINE) 250-250-65 MG per tablet Take 2 tablets by mouth every 6 (six) hours as needed for headache.    . glucose blood (ONE TOUCH ULTRA TEST) test strip Once daily dx 250.00 100 each 11  . HYDROcodone-acetaminophen (NORCO) 7.5-325 MG tablet Take 1 tablet by mouth 4 (four) times daily as needed (pain).    Marland Kitchen ibuprofen (ADVIL,MOTRIN) 200 MG tablet Take 200 mg by mouth 4 (four) times  daily as needed for moderate pain.     Marland Kitchen lisinopril (PRINIVIL,ZESTRIL) 20 MG tablet Take 1 tablet (20 mg total) by mouth daily. 90 tablet 3  . metFORMIN (GLUCOPHAGE-XR) 500 MG 24 hr tablet Take 100 mg by mouth every morning.    [name] DELICA LANCETS MISC Once daily dx 250.00 Once daily dx 250.00 100 each 11  . sildenafil (VIAGRA) 100 MG tablet Take 100 mg by mouth daily as needed for erectile dysfunction.    . simvastatin (ZOCOR) 40 MG tablet Take 1 tablet (40 mg total) by mouth at bedtime. 90 tablet 3   No current facility-administered medications for this visit.     No Known Allergies    Review of Systems negative except from HPI and PMH  Physical Exam BP (!) 142/76   Pulse 89   Ht 5\' 9"  (1.753 m)   Wt 218 lb 6.4 oz (99.1 kg)   SpO2 96%   BMI 32.25 kg/m  Well developed and well nourished in no acute distress HENT normal E scleral and icterus clear Neck Supple JVP flat; carotids brisk and full Clear to ausculation Device pocket well healed; without hematoma or erythema.  There is no tethering  Regular rate and rhythm, no murmurs gallops or rub Soft with active bowel sounds No clubbing cyanosis  Tr Edema Alert and oriented, grossly normal motor and sensory function Skin Warm and Dry  ECG P-synchronous/  AV  pacing   Assessment and  Plan  Complete heart block  Medtronic pacemaker  Hypertension   High ventricular threshold  Atrial tach    Device reprogramming had accomplished a six-year longevity.  Some peripheral edema. We discussed diuretics he would like to hold off.  Blood pressure is mildly elevated; he will check it at home  We will plan echo at next year visit for pacer cardiomyopahty

## 2016-07-03 ENCOUNTER — Encounter: Payer: Self-pay | Admitting: Internal Medicine

## 2016-07-04 LAB — CUP PACEART INCLINIC DEVICE CHECK
Battery Remaining Longevity: 77 mo
Brady Statistic AP VS Percent: 0 %
Brady Statistic RA Percent Paced: 0.25 %
Brady Statistic RV Percent Paced: 99.91 %
Implantable Lead Implant Date: 20160715
Implantable Lead Location: 753860
Implantable Lead Model: 5076
Lead Channel Impedance Value: 475 Ohm
Lead Channel Pacing Threshold Amplitude: 0.75 V
Lead Channel Pacing Threshold Pulse Width: 0.4 ms
Lead Channel Sensing Intrinsic Amplitude: 16.375 mV
Lead Channel Sensing Intrinsic Amplitude: 3.75 mV
Lead Channel Sensing Intrinsic Amplitude: 3.75 mV
Lead Channel Setting Sensing Sensitivity: 4 mV
MDC IDC LEAD IMPLANT DT: 20160715
MDC IDC LEAD LOCATION: 753859
MDC IDC MSMT BATTERY VOLTAGE: 3 V
MDC IDC MSMT LEADCHNL RA IMPEDANCE VALUE: 342 Ohm
MDC IDC MSMT LEADCHNL RA IMPEDANCE VALUE: 456 Ohm
MDC IDC MSMT LEADCHNL RV IMPEDANCE VALUE: 380 Ohm
MDC IDC MSMT LEADCHNL RV PACING THRESHOLD AMPLITUDE: 1.5 V
MDC IDC MSMT LEADCHNL RV PACING THRESHOLD PULSEWIDTH: 1.2 ms
MDC IDC MSMT LEADCHNL RV SENSING INTR AMPL: 16.375 mV
MDC IDC SESS DTM: 20170731124907
MDC IDC SET LEADCHNL RA PACING AMPLITUDE: 1.5 V
MDC IDC SET LEADCHNL RV PACING AMPLITUDE: 2.5 V
MDC IDC SET LEADCHNL RV PACING PULSEWIDTH: 1.2 ms
MDC IDC STAT BRADY AP VP PERCENT: 0.25 %
MDC IDC STAT BRADY AS VP PERCENT: 99.66 %
MDC IDC STAT BRADY AS VS PERCENT: 0.09 %

## 2016-07-21 ENCOUNTER — Encounter (HOSPITAL_COMMUNITY): Payer: Self-pay | Admitting: *Deleted

## 2016-07-21 ENCOUNTER — Emergency Department (HOSPITAL_COMMUNITY): Payer: Federal, State, Local not specified - PPO

## 2016-07-21 ENCOUNTER — Emergency Department (HOSPITAL_COMMUNITY)
Admission: EM | Admit: 2016-07-21 | Discharge: 2016-07-21 | Disposition: A | Discharge status: Home / Self Care | Payer: Federal, State, Local not specified - PPO | Attending: Emergency Medicine | Admitting: Emergency Medicine

## 2016-07-21 DIAGNOSIS — Z87891 Personal history of nicotine dependence: Secondary | ICD-10-CM | POA: Diagnosis not present

## 2016-07-21 DIAGNOSIS — Z79899 Other long term (current) drug therapy: Secondary | ICD-10-CM | POA: Insufficient documentation

## 2016-07-21 DIAGNOSIS — I459 Conduction disorder, unspecified: Secondary | ICD-10-CM | POA: Diagnosis not present

## 2016-07-21 DIAGNOSIS — Y999 Unspecified external cause status: Secondary | ICD-10-CM | POA: Diagnosis not present

## 2016-07-21 DIAGNOSIS — R55 Syncope and collapse: Secondary | ICD-10-CM | POA: Insufficient documentation

## 2016-07-21 DIAGNOSIS — E119 Type 2 diabetes mellitus without complications: Secondary | ICD-10-CM | POA: Insufficient documentation

## 2016-07-21 DIAGNOSIS — I442 Atrioventricular block, complete: Secondary | ICD-10-CM | POA: Diagnosis not present

## 2016-07-21 DIAGNOSIS — Y929 Unspecified place or not applicable: Secondary | ICD-10-CM | POA: Insufficient documentation

## 2016-07-21 DIAGNOSIS — Z7984 Long term (current) use of oral hypoglycemic drugs: Secondary | ICD-10-CM | POA: Diagnosis not present

## 2016-07-21 DIAGNOSIS — Y939 Activity, unspecified: Secondary | ICD-10-CM | POA: Diagnosis not present

## 2016-07-21 DIAGNOSIS — F909 Attention-deficit hyperactivity disorder, unspecified type: Secondary | ICD-10-CM | POA: Diagnosis not present

## 2016-07-21 DIAGNOSIS — I10 Essential (primary) hypertension: Secondary | ICD-10-CM | POA: Insufficient documentation

## 2016-07-21 DIAGNOSIS — T82118A Breakdown (mechanical) of other cardiac electronic device, initial encounter: Secondary | ICD-10-CM | POA: Diagnosis not present

## 2016-07-21 DIAGNOSIS — S299XXA Unspecified injury of thorax, initial encounter: Secondary | ICD-10-CM | POA: Diagnosis present

## 2016-07-21 DIAGNOSIS — J449 Chronic obstructive pulmonary disease, unspecified: Secondary | ICD-10-CM | POA: Insufficient documentation

## 2016-07-21 DIAGNOSIS — W01198A Fall on same level from slipping, tripping and stumbling with subsequent striking against other object, initial encounter: Secondary | ICD-10-CM | POA: Diagnosis not present

## 2016-07-21 DIAGNOSIS — S20211A Contusion of right front wall of thorax, initial encounter: Secondary | ICD-10-CM | POA: Diagnosis not present

## 2016-07-21 LAB — BASIC METABOLIC PANEL
Anion gap: 7 (ref 5–15)
BUN: 18 mg/dL (ref 6–20)
CO2: 25 mmol/L (ref 22–32)
Calcium: 8.8 mg/dL — ABNORMAL LOW (ref 8.9–10.3)
Chloride: 106 mmol/L (ref 101–111)
Creatinine, Ser: 0.72 mg/dL (ref 0.61–1.24)
GFR calc Af Amer: >60 mL/min (ref >60)
GFR calc non Af Amer: >60 mL/min (ref >60)
Glucose, Bld: 178 mg/dL — ABNORMAL HIGH (ref 65–99)
Potassium: 4 mmol/L (ref 3.5–5.1)
Sodium: 138 mmol/L (ref 135–145)

## 2016-07-21 LAB — CBC WITH DIFFERENTIAL/PLATELET
Basophils Absolute: 0 10*3/uL (ref 0.0–0.1)
Basophils Relative: 0 %
Eosinophils Absolute: 0 10*3/uL (ref 0.0–0.7)
Eosinophils Relative: 0 %
HCT: 42.6 % (ref 39.0–52.0)
Hemoglobin: 13.8 g/dL (ref 13.0–17.0)
Lymphocytes Relative: 5 %
Lymphs Abs: 0.7 10*3/uL (ref 0.7–4.0)
MCH: 28.9 pg (ref 26.0–34.0)
MCHC: 32.4 g/dL (ref 30.0–36.0)
MCV: 89.1 fL (ref 78.0–100.0)
Monocytes Absolute: 0.5 10*3/uL (ref 0.1–1.0)
Monocytes Relative: 3 %
Neutro Abs: 13.9 10*3/uL — ABNORMAL HIGH (ref 1.7–7.7)
Neutrophils Relative %: 92 %
Platelets: 196 10*3/uL (ref 150–400)
RBC: 4.78 MIL/uL (ref 4.22–5.81)
RDW: 13.4 % (ref 11.5–15.5)
WBC: 15.1 10*3/uL — ABNORMAL HIGH (ref 4.0–10.5)

## 2016-07-21 LAB — TROPONIN I: Troponin I: <0.03 ng/mL (ref <0.03)

## 2016-07-21 LAB — MAGNESIUM: Magnesium: 1.9 mg/dL (ref 1.7–2.4)

## 2016-07-21 MED ORDER — TRAMADOL HCL 50 MG PO TABS
50.0000 mg | ORAL_TABLET | Freq: Once | ORAL | Status: AC
  Administered 2016-07-21: 50 mg via ORAL
  Filled 2016-07-21: qty 1

## 2016-07-21 MED ORDER — TRAMADOL HCL 50 MG PO TABS: 50.0000 mg | ORAL_TABLET | Freq: Four times a day (QID) | ORAL | 0 refills | Status: DC | PRN

## 2016-07-21 NOTE — ED Triage Notes (Signed)
Pt passed out multiple times this am and EMS responded but EMS found that pacemaker was functioning properly.  Pt continued to feel unwell, nauseated, weak.  EMS was called again and they found that his HR dropped as low as 30 without syncope for ems.  Pt arrives alert and oriented, some nausea and weakness.  Crash cart to outside room, pt placed on pacer pads with zoll at bedside for standby.  No CP or SOB

## 2016-07-21 NOTE — ED Notes (Signed)
Medtronic rep has seen pt and notifies me that pacemaker was adjusted and is functioning as expected again

## 2016-07-21 NOTE — ED Notes (Signed)
EMS IV removed.

## 2016-07-21 NOTE — ED Provider Notes (Signed)
[facility]-EMERGENCY DEPT Provider Note   CSN: [id number] Arrival date & time: 07/21/16  1120  By signing my name below, I, Doreatha Martin, attest that this documentation has been prepared under the direction and in the presence of Raeford Razor, MD. Electronically Signed: Doreatha Martin, ED Scribe. 07/21/16. 11:41 AM.     History   Chief Complaint Chief Complaint  Patient presents with  . Pacemaker Problem    HPI Jeremy Abbott is a 70 y.o. male s/p Medtronic pacemaker placement 06/17/15 d/t complete heart block who presents to the Emergency Department complaining of moderate lightheadedness onset last night at dinner. Per friend, he also experienced several episodes of syncope after dinner, lasting for seconds at a time. Friend also states that the pt began to experience nausea and emesis later in the evening. Per pt, the paramedics first responded at 4 am this morning and found that he was stable at that time so he declined transport. Pt states he woke again later in the morning lightheaded and nauseated, continued to vomit and continued to experience multiple episodes of near-syncope. Per pt, when the paramedics responded again, they found his HR fluctuating between 30-100 BPM. Pt additionaly complains of HA at this time. He also states he has some right costal pain that he attributes to striking a door jamb as he fell during an episode of syncope or near-syncope, but denies additional injuries or head injury. Pt states he last saw Dr. Graciela Husbands, who placed his pacemaker on 07/02/16 and reports his pacemaker was functioning properly. Pt states he has been taking pain medication recently for shoulder pain, but denies additional changes in medications.   The history is provided by the patient and a friend. No language interpreter was used.    Past Medical History:  Diagnosis Date  . ADHD (attention deficit hyperactivity disorder)   . Adjustment disorder with anxiety   . Allergic rhinitis   . Anxiety     . Complete heart block (HCC)    a. s/p Medtronic PPM 06/17/15 (device dependent).  . COPD (chronic obstructive pulmonary disease) (HCC) 11/12/2011  . Depression   . Diabetes mellitus (HCC)   . Essential hypertension   . Fracture of right clavicle 11/09/2009   Chronic right collarbone pain-non union  . Hyperlipidemia   . Hypokalemia   . NSAID long-term use   . RECTAL BLEEDING 01/22/2008    Patient Active Problem List   Diagnosis Date Noted  . Symptomatic bradycardia 06/17/2015  . Mobitz type 2 second degree AV block 06/17/2015  . 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 Mobitz type 2 second degree atrioventricular block 06/17/2015  . Paresthesia 05/12/2012  . Left shoulder pain 05/14/2011  . Preventative health care 05/13/2011  . TACHYCARDIA 10/19/2010  . BACK PAIN 11/09/2009  . PSA, INCREASED 11/09/2009  . FRACTURE, CLAVICLE, RIGHT 11/09/2009  . Adjustment disorder with anxiety 08/04/2008  . ADD 08/04/2008  . SHOULDER STRAIN, RIGHT 08/04/2008  . DM2 (diabetes mellitus, type 2) (HCC) 02/19/2008  . Hyperlipidemia 02/19/2008  . ANXIETY 02/19/2008  . DEPRESSION 02/19/2008  . Essential hypertension 02/19/2008  . ALLERGIC RHINITIS 02/19/2008  . PERIPHERAL EDEMA 02/19/2008  . RECTAL BLEEDING 01/22/2008    Past Surgical History:  Procedure Laterality Date  . EP IMPLANTABLE DEVICE N/A 06/17/2015   Procedure: Pacemaker Implant;  Surgeon: Duke Salvia, MD;  Location: [facility] INVASIVE CV LAB;  Service: Cardiovascular;  Laterality: N/A;  . ORIF CLAVICLE FRACTURE  2/08   s/p collarbone fx with Fall-Murphy  Wainer  . TONSILLECTOMY         Home Medications    Prior to Admission medications   Medication Sig Start Date End Date Taking? Authorizing Provider  amphetamine-dextroamphetamine (ADDERALL XR) 25 MG 24 hr capsule Take 25 mg by mouth every morning.    Historical Provider, MD  aspirin-acetaminophen-caffeine (EXCEDRIN MIGRAINE) [phone] MG per tablet Take 2  tablets by mouth every 6 (six) hours as needed for headache.    Historical Provider, MD  glucose blood (ONE TOUCH ULTRA TEST) test strip Once daily dx 250.00 11/13/12   Corwin LevinsJames W John, MD  HYDROcodone-acetaminophen (NORCO) 7.5-325 MG tablet Take 1 tablet by mouth 4 (four) times daily as needed (pain).    Historical Provider, MD  ibuprofen (ADVIL,MOTRIN) 200 MG tablet Take 200 mg by mouth 4 (four) times daily as needed for moderate pain.     Historical Provider, MD  lisinopril (PRINIVIL,ZESTRIL) 20 MG tablet Take 1 tablet (20 mg total) by mouth daily. 11/13/12   Corwin LevinsJames W John, MD  metFORMIN (GLUCOPHAGE-XR) 500 MG 24 hr tablet Take 100 mg by mouth every morning.    Historical Provider, MD  [facility] DELICA [facility]ANC[facility]ISC Once daily dx 250.00 Once daily dx 250.00 11/13/12   Corwin LevinsJames W John, MD  sildenafil (VIAGRA) 100 MG tablet Take 100 mg by mouth daily as needed for erectile dysfunction.    Historical Provider, MD  simvastatin (ZOCOR) 40 MG tablet Take 1 tablet (40 mg total) by mouth at bedtime. 11/13/12   Corwin LevinsJames W John, MD    Family History Family History  Problem Relation Age of Onset  . Heart failure Father   . Diabetes Daughter   . Heart attack Neg Hx   . Stroke Neg Hx     Social History Social History  Substance Use Topics  . Smoking status: Former Games developermoker  . Smokeless tobacco: Never Used  . Alcohol use No     Allergies   Review of patient's allergies indicates no known allergies.   Review of Systems Review of Systems  Gastrointestinal: Positive for nausea and vomiting.  Musculoskeletal: Positive for arthralgias.  Neurological: Positive for syncope, light-headedness and headaches.  All other systems reviewed and are negative.   Physical Exam Updated Vital Signs Ht 5\' 9"  (1.753 m)   Wt 218 lb (98.9 kg)   BMI 32.19 kg/m   Physical Exam  Constitutional: He appears well-developed and well-nourished. No distress.  HENT:  Head: Normocephalic and atraumatic.  Mouth/Throat:  Oropharynx is clear and moist. No oropharyngeal exudate.  Eyes: Conjunctivae and EOM are normal. Pupils are equal, round, and reactive to light. Right eye exhibits no discharge. Left eye exhibits no discharge. No scleral icterus.  Neck: Normal range of motion. Neck supple. No JVD present. No thyromegaly present.  Cardiovascular: Normal rate, regular rhythm, normal heart sounds and intact distal pulses.  Exam reveals no gallop and no friction rub.   No murmur heard. Pulmonary/Chest: Effort normal and breath sounds normal. No respiratory distress. He has no wheezes. He has no rales.  Abdominal: Soft. Bowel sounds are normal. He exhibits no distension and no mass. There is no tenderness.  Musculoskeletal: Normal range of motion. He exhibits no edema or tenderness.  Lymphadenopathy:    He has no cervical adenopathy.  Neurological: He is alert. Coordination normal.  Skin: Skin is warm and dry. No rash noted. No erythema.  Psychiatric: He has a normal mood and affect. His behavior is normal.  Nursing note and vitals reviewed.  ED Treatments / Results  Labs (all labs ordered are listed, but only abnormal results are displayed) Labs Reviewed - No data to display  EKG  EKG Interpretation None       Radiology No results found.  Procedures Procedures (including critical care time)  DIAGNOSTIC STUDIES: Oxygen Saturation is 97% on RA, normal by my interpretation.    COORDINATION OF CARE: 11:38 AM Discussed treatment plan with pt at bedside which includes CXR, lab work, cardiology consult and pt agreed to plan.    Medications Ordered in ED Medications - No data to display   Initial Impression / Assessment and Plan / ED Course  I have reviewed the triage vital signs and the nursing notes.  Pertinent labs & imaging results that were available during my care of the patient were reviewed by me and considered in my medical decision making (see chart for details).  Clinical Course     69yM with syncope/near syncope. Pre-hospital tracings reviewed and notable for several pacer stimuli not capturing. Currently now essentially asymptomatic with HR in 90s. Placed on pads, will interrogate device, CXR to assess leads, check lytes, consult cardiology. Some mild R sided CP but this occurred when striking R chest against door jamb one of the times he fell. Some ecchymosis R lateral chest wall. No fx noted on imaging although not dedicated rib films. Management wouldn't change significantly if did identify fx. PRN pain meds.   Final Clinical Impressions(s) / ED Diagnoses   Final diagnoses:  Near syncope    New Prescriptions New Prescriptions   No medications on file    I personally preformed the services scribed in my presence. The recorded information has been reviewed is accurate. Raeford RazorStephen Donette Mainwaring, MD.    Raeford RazorStephen Bodi Palmeri, MD 07/21/16 [phone]

## 2016-07-21 NOTE — ED Notes (Signed)
Interrogated medtronic pacemaker 

## 2016-07-21 NOTE — Consult Note (Signed)
ELECTROPHYSIOLOGY CONSULT NOTE    Primary Care Physician: Lolita Patella, MD Referring Physician:  Dr Juleen China  Admit Date: 07/21/2016  Reason for consultation:  Syncope  Jeremy Abbott is a 70 y.o. male with a h/o prior CHB s/p PPM implant by Dr Graciela Husbands who presents with recurrent syncope and pacemaker noncapture.  The patient is S/p MDT Advisa implant by Dr Graciela Husbands 06/2015.  On wound check, he was noted to have an elevated RV threshold several weeks later.  Capture management was turned off to preserve battery and his RV pacing output was fixed at 2.5V @1 .2 msec.  He did well for the past year.  He recently was seen by Dr Graciela Husbands in the office 06/2016.  He was felt to have a stable but elevated RV pacing threshold at 1.5V @1 .2 msec.  No changes were made. The patient did well until last evening.  Last night, he had 3 episodes of abrupt syncope.  These were short but did result in fall on 1 occasion.  He found that if he tried to stand up or exert himself that he would pass out.  EMS was called and he was brought to [facility].  On EMS transfer, he was noted to have noncapture of pacing spikes with complete heart block observed.    Today, he denies symptoms of palpitations, chest pain, shortness of breath, orthopnea, PND, lower extremity edema,  or neurologic sequela.  He hit his head upon fall but has been evaluated and cleared by ED MD.  He denies HA, visual changes, blurred vision, or other neuro symptoms. He has pain over his R flank which he attributes to falling against a door knob.  Past Medical History:  Diagnosis Date  . ADHD (attention deficit hyperactivity disorder)   . Adjustment disorder with anxiety   . Allergic rhinitis   . Anxiety   . Complete heart block (HCC)    a. s/p Medtronic PPM 06/17/15 (device dependent).  . COPD (chronic obstructive pulmonary disease) (HCC) 11/12/2011  . Depression   . Diabetes mellitus (HCC)   . Essential hypertension   . Fracture of right clavicle  11/09/2009   Chronic right collarbone pain-non union  . Hyperlipidemia   . Hypokalemia   . NSAID long-term use   . RECTAL BLEEDING 01/22/2008   Past Surgical History:  Procedure Laterality Date  . EP IMPLANTABLE DEVICE N/A 06/17/2015   Procedure: Pacemaker Implant;  Surgeon: Duke Salvia, MD;  Location: [facility] INVASIVE CV LAB;  Service: Cardiovascular;  Laterality: N/A;  . ORIF CLAVICLE FRACTURE  2/08   s/p collarbone fx with Will Bonnet  . TONSILLECTOMY     No Known Allergies  Social History   Social History  . Marital status: Single    Spouse name: N/A  . Number of children: N/A  . Years of education: N/A   Occupational History  . 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 Smoking status: Former Games developer  . Smokeless tobacco: Never Used  . Alcohol use No  . Drug use: No  . Sexual activity: Not on file   Other Topics Concern  . Not on file   Social History Narrative  . No narrative on file    Family History  Problem Relation Age of Onset  . Heart failure Father   . Diabetes Daughter   . Heart attack Neg Hx   . Stroke Neg Hx     ROS- All systems are  reviewed and negative except as per the HPI above  Physical Exam: Telemetry:  Sinus with intermittent loss of V capture Vitals:   07/21/16 1132 07/21/16 1146 07/21/16 1300 07/21/16 1350  BP: 112/72  128/73 128/73  Pulse:   92   Resp: 18  23 18   Temp: 98.4 F (36.9 C)   97.4 F (36.3 C)  TempSrc: Oral     SpO2: 94%  98% 98%  Weight:  213 lb 3 oz (96.7 kg)    Height:  5' 9.5" (1.765 m)      GEN- The patient is anxious appearing, alert and oriented x 3 today.   Head- normocephalic, atraumatic Eyes-  Sclera clear, conjunctiva pink Ears- hearing intact Oropharynx- clear Neck- supple  Lungs- Clear to ausculation bilaterally, normal work of breathing Heart- Regular rate and rhythm, no murmurs, rubs or gallops, PMI not laterally displaced GI- soft, NT, ND, +  BS Extremities- no clubbing, cyanosis, or edema MS- moderately tender over R posterior flank Skin- no rash or lesion Psych- euthymic mood, full affect Neuro- strength and sensation are intact  EKG reveals sinus with V pacing There are ems strips which clearly show loss of V capture with underlying escape rhythm  Labs:   Lab Results  Component Value Date   WBC 15.1 (H) 07/21/2016   HGB 13.8 07/21/2016   HCT 42.6 07/21/2016   MCV 89.1 07/21/2016   PLT 196 07/21/2016    Recent Labs Lab 07/21/16 1145  NA 138  K 4.0  CL 106  CO2 25  BUN 18  CREATININE 0.72  CALCIUM 8.8*  GLUCOSE 178*   Lab Results  Component Value Date   TROPONINI <0.03 07/21/2016    Lab Results  Component Value Date   CHOL 105 06/18/2015   CHOL 163 11/10/2012   CHOL 120 05/09/2012   Lab Results  Component Value Date   HDL 21 (L) 06/18/2015   HDL 31.60 (L) 11/10/2012   HDL 27.70 (L) 05/09/2012   Lab Results  Component Value Date   LDLCALC 64 06/18/2015   LDLCALC 104 (H) 11/10/2012   LDLCALC 74 05/09/2012   Lab Results  Component Value Date   TRIG 102 06/18/2015   TRIG 135.0 11/10/2012   TRIG 90.0 05/09/2012   Lab Results  Component Value Date   CHOLHDL 5.0 06/18/2015   CHOLHDL 5 11/10/2012   CHOLHDL 4 05/09/2012   Lab Results  Component Value Date   LDLDIRECT 102.9 10/19/2010      Radiology:  CXR reveals stable RV lead position, no clear issues with the lead  ASSESSMENT AND PLAN:   1. Complete heart block with pacemaker loss of capture/ syncope The patient has chronically elevated RV pacing threshold which was acutely increased from 1.5 V @ 1.2 msec to 2.5V@1 .2 msec today.  This has resulted in loss of capture from his device and syncope.  I have interrogated the device today.  RV impedance and sensing is very stable.  Atrial lead parameters are also stable.  Battery longevity is 6 years.  I have increased pacing output to 5.0V @1 .2 msec today.  He has had no further loss of  capture with multiple changes in position and with movement.  I could not turn capture management back on as this would require 0.4 msec pulse width and 1.2 msec is desired presently. He will be discharged from the ED today to follow-up with Dr Graciela HusbandsKlein this Tuesday at 4:15 pm.  Consideration of lead revision can be discussed at  that time.  I have instructed the patient to return to the ER immediately should any further syncope occur.  I have also instructed him to not drive until he has been evaluated by Dr Graciela HusbandsKlein who can then make further recommendations.   Hillis RangeJames Everlean Bucher, MD 07/21/2016  3:51 PM

## 2016-07-24 ENCOUNTER — Encounter: Payer: Self-pay | Admitting: Internal Medicine

## 2016-07-24 ENCOUNTER — Ambulatory Visit (INDEPENDENT_AMBULATORY_CARE_PROVIDER_SITE_OTHER): Payer: Federal, State, Local not specified - PPO | Admitting: Internal Medicine

## 2016-07-24 VITALS — BP 144/88 | HR 88 | Ht 69.5 in | Wt 214.2 lb

## 2016-07-24 DIAGNOSIS — I442 Atrioventricular block, complete: Secondary | ICD-10-CM

## 2016-07-24 DIAGNOSIS — I471 Supraventricular tachycardia: Secondary | ICD-10-CM | POA: Diagnosis not present

## 2016-07-24 DIAGNOSIS — Z950 Presence of cardiac pacemaker: Secondary | ICD-10-CM

## 2016-07-24 NOTE — Patient Instructions (Signed)
Medication Instructions: - Your physician recommends that you continue on your current medications as directed. Please refer to the Current Medication list given to you today.  Labwork: - none  Procedures/Testing: - none  Follow-Up: - You have been referred to : Dr. Lewayne BuntingGregg Taylor for evaluation of lead extraction/ system revision.  Any Additional Special Instructions Will Be Listed Below (If Applicable).     If you need a refill on your cardiac medications before your next appointment, please call your pharmacy.

## 2016-07-25 NOTE — Progress Notes (Signed)
Patient Care Team: Elias Elseobert Reade, MD as PCP - General (Family Medicine)   HPI  Jeremy StallGeorge P Dorning is a 70 y.o. male Seen in followup for PM implanted 7/16 for CHB.  Medtronic complicated by high left ventricular pacing thresholds.  He was seen a month ago with stable thresholds at 1.5 V. He was seen in the emergency room by Dr. Fawn KirkJA  because of abrupt syncope associated with documented loss of ventricular capture. The device was reprogrammed to 5 V.    He comes in today to review plans. He has many questions as relates to the cause for high pacing thresholds and a comment made in the emergency room regarding not programmed adequately  echo 06/17/15 showed EF 60-65%, no RWMA, mildly dilated RA, dilated IVC  Hx of HTN, DM    Past Medical History:  Diagnosis Date  . ADHD (attention deficit hyperactivity disorder)   . Adjustment disorder with anxiety   . Allergic rhinitis   . Anxiety   . Complete heart block (HCC)    a. s/p Medtronic PPM 06/17/15 (device dependent).  . COPD (chronic obstructive pulmonary disease) (HCC) 11/12/2011  . Depression   . Diabetes mellitus (HCC)   . Essential hypertension   . Fracture of right clavicle 11/09/2009   Chronic right collarbone pain-non union  . Hyperlipidemia   . Hypokalemia   . NSAID long-term use   . RECTAL BLEEDING 01/22/2008    Past Surgical History:  Procedure Laterality Date  . EP IMPLANTABLE DEVICE N/A 06/17/2015   Procedure: Pacemaker Implant;  Surgeon: Duke SalviaSteven C Klein, MD;  [facility];  Service: Cardiovascular;  Laterality: N/A;  . ORIF CLAVICLE FRACTURE  2/08   s/p collarbone fx with Will BonnetFall-Murphy Wainer  . TONSILLECTOMY      Current Outpatient Prescriptions  Medication Sig Dispense Refill  . amphetamine-dextroamphetamine (ADDERALL XR) 25 MG 24 hr capsule Take 25 mg by mouth every morning.    Marland Kitchen. aspirin-acetaminophen-caffeine (EXCEDRIN MIGRAINE) 250-250-65 MG per tablet Take 2 tablets by mouth every 6 (six)  hours as needed for headache.    . glucose blood (ONE TOUCH ULTRA TEST) test strip Once daily dx 250.00 100 each 11  . HYDROcodone-acetaminophen (NORCO) 7.5-325 MG tablet Take 1 tablet by mouth 4 (four) times daily as needed (pain).    Marland Kitchen. ibuprofen (ADVIL,MOTRIN) 200 MG tablet Take 200 mg by mouth 4 (four) times daily as needed for moderate pain.     Marland Kitchen. lisinopril (PRINIVIL,ZESTRIL) 20 MG tablet Take 1 tablet (20 mg total) by mouth daily. 90 tablet 3  . metFORMIN (GLUCOPHAGE-XR) 500 MG 24 hr tablet Take 1,000 mg by mouth every morning.     [name]. ONETOUCH DELICA LANCETS MISC Once daily dx 250.00 Once daily dx 250.00 100 each 11  . sildenafil (VIAGRA) 100 MG tablet Take 100 mg by mouth daily as needed for erectile dysfunction.    . simvastatin (ZOCOR) 40 MG tablet Take 1 tablet (40 mg total) by mouth at bedtime. 90 tablet 3  . traMADol (ULTRAM) 50 MG tablet Take 50 mg by mouth every 6 (six) hours as needed (pain).     No current facility-administered medications for this visit.     No Known Allergies    Review of Systems negative except from HPI and PMH  Physical Exam BP (!) 144/88   Pulse 88   Ht 5' 9.5" (1.765 m)   Wt 214 lb 3.2 oz (97.2 kg)   SpO2 95%  BMI 31.18 kg/m  Well developed and well nourished in no acute distress HENT normal E scleral and icterus clear Neck Supple JVP flat; carotids brisk and full Clear to ausculation Plethora veins across his left chest  Device pocket well healed; without hematoma or erythema.  There is no tethering  Regular rate and rhythm, no murmurs gallops or rub Soft with active bowel sounds No clubbing cyanosis  Tr Edema Alert and oriented, grossly normal motor and sensory function Skin Warm and Dry  ECG P-synchronous/ AV  pacing   Assessment and  Plan  Complete heart block  Medtronic pacemaker  Hypertension   High ventricular threshold  Atrial tach   The patient has increasing problems with ventricular pacing thresholds. This  resulted in syncope. His device is been programmed now at 7 V to assure capture.  We discussed the fact that I do not know the trajectory of his changes in threshold given the abrupt loss of capture that we saw last week. Hence, I suggested that proceeding with resolution and revision should be accomplished sooner rather than later.  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 Hence, and given his age, I think the best option for him is to be referred to Dr. Ladona Ridgelaylor for lead extraction and lead reimplantation to the left side. We will arrange that consultation.  We discussed at some length but not extensively the potential risks associated with extraction. He asked if it would be done in the operating room that is what he died in his heart were performed by extraction. At that point I described that frequently these are done in the operating room with cardiac surgery standby.    More than 50% of 45 min was spent in counseling related to the above

## 2016-08-03 ENCOUNTER — Telehealth: Payer: Self-pay | Admitting: Internal Medicine

## 2016-08-03 ENCOUNTER — Ambulatory Visit (INDEPENDENT_AMBULATORY_CARE_PROVIDER_SITE_OTHER): Payer: Federal, State, Local not specified - PPO | Admitting: *Deleted

## 2016-08-03 DIAGNOSIS — Z950 Presence of cardiac pacemaker: Secondary | ICD-10-CM

## 2016-08-03 DIAGNOSIS — I442 Atrioventricular block, complete: Secondary | ICD-10-CM

## 2016-08-03 LAB — CUP PACEART INCLINIC DEVICE CHECK
Battery Remaining Longevity: 45 mo
Battery Voltage: 2.96 V
Battery Voltage: 2.93 V
Brady Statistic AP VP Percent: 0.02 %
Brady Statistic AS VP Percent: 99.97 %
Brady Statistic AS VP Percent: 99.69 %
Brady Statistic AS VS Percent: 0.01 %
Brady Statistic RA Percent Paced: 0.28 %
Date Time Interrogation Session: 20170822205650
Date Time Interrogation Session: 20170901162036
Implantable Lead Implant Date: 20160715
Implantable Lead Location: 753860
Implantable Lead Model: 5076
Implantable Lead Model: 5076
Lead Channel Impedance Value: 361 Ohm
Lead Channel Impedance Value: 456 Ohm
Lead Channel Impedance Value: 456 Ohm
Lead Channel Pacing Threshold Amplitude: 2.5 V
Lead Channel Setting Pacing Amplitude: 1.5 V
Lead Channel Setting Pacing Amplitude: 5 V
Lead Channel Setting Pacing Pulse Width: 1.2 ms
Lead Channel Setting Pacing Pulse Width: 1.2 ms
Lead Channel Setting Sensing Sensitivity: 4 mV
Lead Channel Setting Sensing Sensitivity: 4 mV
MDC IDC LEAD IMPLANT DT: 20160715
MDC IDC LEAD IMPLANT DT: 20160715
MDC IDC LEAD IMPLANT DT: 20160715
MDC IDC LEAD LOCATION: 753859
MDC IDC LEAD LOCATION: 753859
MDC IDC LEAD LOCATION: 753860
MDC IDC MSMT BATTERY REMAINING LONGEVITY: 77 mo
MDC IDC MSMT LEADCHNL RA IMPEDANCE VALUE: 342 Ohm
MDC IDC MSMT LEADCHNL RA IMPEDANCE VALUE: 475 Ohm
MDC IDC MSMT LEADCHNL RA IMPEDANCE VALUE: 342 Ohm
MDC IDC MSMT LEADCHNL RA IMPEDANCE VALUE: 456 Ohm
MDC IDC MSMT LEADCHNL RA PACING THRESHOLD AMPLITUDE: 0.75 V
MDC IDC MSMT LEADCHNL RA PACING THRESHOLD PULSEWIDTH: 0.4 ms
MDC IDC MSMT LEADCHNL RA SENSING INTR AMPL: 5.25 mV
MDC IDC MSMT LEADCHNL RV IMPEDANCE VALUE: 380 Ohm
MDC IDC MSMT LEADCHNL RV PACING THRESHOLD AMPLITUDE: 1.75 V
MDC IDC MSMT LEADCHNL RV PACING THRESHOLD PULSEWIDTH: 0.4 ms
MDC IDC MSMT LEADCHNL RV PACING THRESHOLD PULSEWIDTH: 1.2 ms
MDC IDC MSMT LEADCHNL RV SENSING INTR AMPL: 8.875 mV
MDC IDC SET LEADCHNL RA PACING AMPLITUDE: 1.5 V
MDC IDC SET LEADCHNL RV PACING AMPLITUDE: 8 V
MDC IDC STAT BRADY AP VP PERCENT: 0.28 %
MDC IDC STAT BRADY AP VS PERCENT: 0 %
MDC IDC STAT BRADY AP VS PERCENT: 0 %
MDC IDC STAT BRADY AS VS PERCENT: 0.02 %
MDC IDC STAT BRADY RA PERCENT PACED: 0.02 %
MDC IDC STAT BRADY RV PERCENT PACED: 99.99 %
MDC IDC STAT BRADY RV PERCENT PACED: 99.98 %

## 2016-08-03 NOTE — Progress Notes (Signed)
PPM check in clinic for RV lead threshold check due to phrenic nerve stimulation. RV threshold 1.75v @ 1.542ms. Patient very concerned about his safety. Per SK verbal orders- ok to set RV output 2x today's threshold. Patient felt better if the output was set to 5V @ 1.332ms. We tested each voltage between 5V and 8V for stimulation- minimal at 5v. ROV with GT 08/28/16 to discuss lead revision.

## 2016-08-03 NOTE — Telephone Encounter (Signed)
Returned call to Mr. Jeremy Abbott. He was very descriptive as to what has been happening with his PPM. He has complete AV block and is dependent on the PPM, he experienced sudden syncope 8/19 due to elevated RV threshold. He had a ppm adjustment for safety in the ER and the output was further increased at his appt 07/24/16. He is very concerned about discomfort he is experiencing that sounds like phrenic nerve stimulation. He is concerned about a future trip that he has planned in October and if this cannot be adjusted he will have to cancel his trip. He wants to be "very clear" that he is ok if this elevated output is "absolutely necessary" and he will cancel his trip. He wants to know if it is "overkill" and maybe can be adjusted some. I told him that I would need to talk to Jeremy Abbott as this change was made for his safety and that I would call Mr. Jeremy Abbott back afterwards. He is Adult nurseappreciative.

## 2016-08-03 NOTE — Telephone Encounter (Deleted)
New Message ° °error °

## 2016-08-03 NOTE — Telephone Encounter (Signed)
I spoke with Dr. Graciela HusbandsKlein and his recommendation is to have Mr. Jeremy Abbott come into the office and check his RV threshold and set the output at double the threshold today.   Mr. Jeremy Abbott is aware and will come to the Device Clinic today at 3pm

## 2016-08-03 NOTE — Telephone Encounter (Signed)
New Message  Pt voiced he's concerned because he feels like MD Graciela HusbandsKlein over cranked the device up.  Pt voiced MD Graciela HusbandsKlein stated he was going to crank it up but pt voiced he doesn't know if it's because of his condition or what.  Pt voiced it feels like his heart is jumping out of his chest.   Pt voiced it currently intermittly happening and it is uncomfortable and wondering if he has to wait until 9.26 to see MD Ladona Ridgelaylor or if it's something he can be seen sooner.  Pt voiced he's scheduled to go out of town and if he has to wait he has to cancel his trip with it feeling uncomfortable.  Pt voiced Sherri RadHeather McGhee was in the room with MD Graciela HusbandsKlein when he stated cranking up/over cranking up device.  Please follow up with pt. Thanks!

## 2016-08-11 ENCOUNTER — Encounter: Payer: Self-pay | Admitting: Internal Medicine

## 2016-08-11 ENCOUNTER — Telehealth: Payer: Self-pay | Admitting: Internal Medicine

## 2016-08-11 NOTE — Telephone Encounter (Signed)
Mr. Jeremy Abbott called the after hours cardiology service regarding symptoms of feeling lightheaded today.  He reports that his heart rate today was in the 60's but his blood pressure did go as low as 84 mmHg.  He does take lisinopril for hypertension.  He denies fainting and did feel better after drinking water today.  His most recent blood pressure was 114 mmHg.  He states that his lightheadedness has resolved.  Informed patient that if his symptoms recur, he should report to his local emergency department.  He will monitor his blood pressure and I informed him that he should seek medical attention if he has a persistent blood pressures below 90 mmHg.  Patient informed not to take his lisinopril at this time.  Patient states that he will make arrangements to be seen in clinic this upcoming Monday.  Jeremy ApleyKamal Henderson, MD

## 2016-08-17 NOTE — Telephone Encounter (Signed)
error 

## 2016-08-20 NOTE — Telephone Encounter (Signed)
Please arrange for an echo Continue lisinopril for now

## 2016-08-21 NOTE — Telephone Encounter (Signed)
I called and spoke with the patient. He states that his BP has been averaging about 130/80 since he spoke with the on-call physician. He states that he had had a prostate biopsy the Tuesday prior to his call. He urinated frequently after this was done. He did hydrate well following this episode.  He states he is still holding the lisinopril and will continue to do so until follow up with Dr. Ladona Ridgelaylor on 9/26. He would like to hold off on scheduling the echo until his follow-up with Dr. Ladona Ridgelaylor.

## 2016-08-28 ENCOUNTER — Encounter: Payer: Self-pay | Admitting: Internal Medicine

## 2016-08-28 ENCOUNTER — Ambulatory Visit (INDEPENDENT_AMBULATORY_CARE_PROVIDER_SITE_OTHER): Payer: Federal, State, Local not specified - PPO | Admitting: Internal Medicine

## 2016-08-28 DIAGNOSIS — I442 Atrioventricular block, complete: Secondary | ICD-10-CM | POA: Diagnosis not present

## 2016-08-28 NOTE — Progress Notes (Signed)
HPI [name] is referred today by Dr. Graciela Husbands for evaluation of RV lead dysfunction. The patient has a h/o CHB and underwent PPM insertion 14 months ago. He has had syncope and was found to have his PPM output below his rising ventricular threshold. He had his output turned up and felt diaphragmatic stimulation. He has not passed out again but did note some dizziness which was related to dehydration. 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  No Known Allergies   Current Outpatient Prescriptions  Medication Sig Dispense Refill  . amphetamine-dextroamphetamine (ADDERALL XR) 25 MG 24 hr capsule Take 25 mg by mouth every morning.    Marland Kitchen aspirin-acetaminophen-caffeine (EXCEDRIN MIGRAINE) 250-250-65 MG per tablet Take 2 tablets by mouth every 6 (six) hours as needed for headache.    . glucose blood (ONE TOUCH ULTRA TEST) test strip Once daily dx 250.00 100 each 11  . HYDROcodone-acetaminophen (NORCO) 7.5-325 MG tablet Take 1 tablet by mouth 4 (four) times daily as needed (pain).    Marland Kitchen ibuprofen (ADVIL,MOTRIN) 200 MG tablet Take 200 mg by mouth 4 (four) times daily as needed for moderate pain.     . metFORMIN (GLUCOPHAGE-XR) 500 MG 24 hr tablet Take 1,000 mg by mouth every morning.     [name] DELICA LANCETS MISC Once daily dx 250.00 Once daily dx 250.00 100 each 11  . sildenafil (VIAGRA) 100 MG tablet Take 100 mg by mouth daily as needed for erectile dysfunction.    . simvastatin (ZOCOR) 40 MG tablet Take 1 tablet (40 mg total) by mouth at bedtime. 90 tablet 3  . traMADol (ULTRAM) 50 MG tablet Take 50 mg by mouth every 6 (six) hours as needed (pain).     No current facility-administered medications for this visit.      Past Medical History:  Diagnosis Date  . ADHD (attention deficit hyperactivity disorder)   . Adjustment disorder with anxiety   . Allergic rhinitis   . Anxiety   . Complete heart block (HCC)    a. s/p Medtronic PPM  06/17/15 (device dependent).  . COPD (chronic obstructive pulmonary disease) (HCC) 11/12/2011  . Depression   . Diabetes mellitus (HCC)   . Essential hypertension   . Fracture of right clavicle 11/09/2009   Chronic right collarbone pain-non union  . Hyperlipidemia   . Hypokalemia   . NSAID long-term use   . RECTAL BLEEDING 01/22/2008    ROS:   All systems reviewed and negative except as noted in the HPI.   Past Surgical History:  Procedure Laterality Date  . EP IMPLANTABLE DEVICE N/A 06/17/2015   Procedure: Pacemaker Implant;  Surgeon: Duke Salvia, MD;  Location: [facility] INVASIVE CV LAB;  Service: Cardiovascular;  Laterality: N/A;  . ORIF CLAVICLE FRACTURE  2/08   s/p collarbone fx with Will Bonnet  . TONSILLECTOMY       Family History  Problem Relation Age of Onset  . Heart failure Father   . Diabetes Daughter   . Heart attack Neg Hx   . Stroke Neg Hx      Social History   Social History  . Marital status: Single    Spouse name: N/A  . Number of children: N/A  . Years of education: N/A   Occupational History  . 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 Smoking status: Former Games developer  . Smokeless  tobacco: Never Used  . Alcohol use No  . Drug use: No  . Sexual activity: Not on file   Other Topics Concern  . Not on file   Social History Narrative  . No narrative on file     BP 138/80   Pulse (!) 102   Ht 5' 9.5" (1.765 m)   Wt 212 lb (96.2 kg)   BMI 30.86 kg/m   Physical Exam:  Well appearing 70 yo man, NAD HEENT: Unremarkable Neck:  6 cm JVD, no thyromegally Lymphatics:  No adenopathy Back:  No CVA tenderness Lungs:  Clear with no wheezes HEART:  Regular rate rhythm, no murmurs, no rubs, no clicks Abd:  soft, positive bowel sounds, no organomegally, no rebound, no guarding Ext:  2 plus pulses, no edema, no cyanosis, no clubbing Skin:  No rashes no nodules Neuro:  CN II through XII intact, motor  grossly intact  EKG - NSR with ventricular pacing  DEVICE  Normal device function.  See PaceArt for details.   Assess/Plan: 1. Ventricular lead dysfunction - I have discussed the treatment options with the patient. We will schedule an extraction with insertion of a new RV lead using a temporary lead in the groin.  I spent over an hour with the patient and his wife answering his questions, explaining the situation and reviewing the risks/benefits of the procedure.  Leonia ReevesGregg Khaidyn Staebell,M.D.

## 2016-08-28 NOTE — Patient Instructions (Addendum)
Medication Instructions:  Your physician recommends that you continue on your current medications as directed. Please refer to the Current Medication list given to you today.   Labwork: CBC/CMET 1 week prior  Testing/Procedures: Lead extraction  Follow-Up: To be determined  Any Other Special Instructions Will Be Listed Below ---  Please wash with the CHG Soap the night before and morning of procedure (follow instruction page "Preparing For Surgery").   [facility] to be determined  Nothing to eat or drink after midnight the night before procedure  Do not take any medication day of procedure  Plan 1 night stay    If you need a refill on your cardiac medications before your next appointment, please call your pharmacy.

## 2016-08-30 ENCOUNTER — Telehealth: Payer: Self-pay

## 2016-08-30 NOTE — Telephone Encounter (Signed)
Called pt. Left message on machine for pt to call back.

## 2016-08-30 NOTE — Telephone Encounter (Signed)
Called pt to review lead extraction appointment date/time and details about procedure.  No answer. Left message for pt to call us back today to review.   Pt needs to come in for pre-procedure lab work today or tomorrow.

## 2016-08-30 NOTE — Telephone Encounter (Signed)
Called pt. Left message with next available appt date of 10/24/16. Requested return call to our office to discuss/confirm.

## 2016-08-30 NOTE — Telephone Encounter (Signed)
Received call from pt. Went over date of 09/04/16 for lead extraction. Informed if pt wants to take medication day of procedure with a sip of water, he may do so, but HOLD Metformin.   Pt requested to cancel appt for lead extraction on 09/04/16. He said he is going out of town the next day. He said he must have an appointment after 09/21/16. Informed I will forward to Dr. Ladona Ridgelaylor and to the scheduling department at the hospital. I informed it may be a couple of days before I will hear back with another appointment date/time. Pt was pleasant and agreed with plan.

## 2016-08-30 NOTE — Telephone Encounter (Signed)
Follow Up:; ° ° °Returning your call. °

## 2016-08-31 NOTE — Telephone Encounter (Signed)
Called pt. Pt declined appt of 10/24/16 for lead extraction.  He requested an appt 09/26/16 or 09/27/16. I informed appt 10/24/16 is the first available  appt. Pt said November is too long to wait. (Pt declined initial appt for 09/04/16 because he is traveling out of the country). Pt requested Dr, Ladona Ridgelaylor to try to get approval for this procedure to be performed in EP vs. OR. I explained I will forward information to Dr. Ladona Ridgelaylor to advise. I explained I will  follow-up on 10/2 or 10/3. Pt verbalized understanding and thanked me for calling.

## 2016-08-31 NOTE — Telephone Encounter (Signed)
Follow up ° ° ° °Pt verbalized that he is returning call for rn °

## 2016-09-03 NOTE — Telephone Encounter (Signed)
Received pt call. Pt stated he would like an appt for lead extraction the first week of December. [place] from [place]05[place]-09[place]19[place]. He informed to go ahead and schedule the procedure. He will call back to our office on 09/19/16 to get the appt date and time. Went over all pre-procedure instructions with pt. Pt verbalized understanding.

## 2016-09-03 NOTE — Telephone Encounter (Signed)
Called pt. Received information back from OR scheduling department. Reviewed date of 11/06/16 pt arriving at 11:30 PM (1:30 PM appt time)  for lead extraction. Pt agreed with date/time.   Will forward to Dr. Ladona Ridgelaylor

## 2016-10-24 ENCOUNTER — Ambulatory Visit: Admit: 2016-10-24 | Payer: Federal, State, Local not specified - PPO | Admitting: Internal Medicine

## 2016-10-24 SURGERY — REMOVAL, ELECTRODE LEAD, CARDIAC PACEMAKER, WITHOUT REPLACEMENT
Anesthesia: General | Site: Chest

## 2016-10-31 ENCOUNTER — Telehealth: Payer: Self-pay | Admitting: Internal Medicine

## 2016-10-31 ENCOUNTER — Encounter (HOSPITAL_COMMUNITY)
Admission: RE | Admit: 2016-10-31 | Discharge: 2016-10-31 | Disposition: A | Discharge status: Home / Self Care | Payer: Federal, State, Local not specified - PPO | Source: Ambulatory Visit | Attending: Internal Medicine | Admitting: Internal Medicine

## 2016-10-31 ENCOUNTER — Encounter (HOSPITAL_COMMUNITY): Payer: Self-pay

## 2016-10-31 DIAGNOSIS — R001 Bradycardia, unspecified: Secondary | ICD-10-CM | POA: Insufficient documentation

## 2016-10-31 DIAGNOSIS — R609 Edema, unspecified: Secondary | ICD-10-CM | POA: Insufficient documentation

## 2016-10-31 DIAGNOSIS — E785 Hyperlipidemia, unspecified: Secondary | ICD-10-CM | POA: Diagnosis not present

## 2016-10-31 DIAGNOSIS — I10 Essential (primary) hypertension: Secondary | ICD-10-CM | POA: Insufficient documentation

## 2016-10-31 DIAGNOSIS — I441 Atrioventricular block, second degree: Secondary | ICD-10-CM | POA: Diagnosis not present

## 2016-10-31 DIAGNOSIS — Z01812 Encounter for preprocedural laboratory examination: Secondary | ICD-10-CM | POA: Diagnosis not present

## 2016-10-31 DIAGNOSIS — F329 Major depressive disorder, single episode, unspecified: Secondary | ICD-10-CM | POA: Insufficient documentation

## 2016-10-31 DIAGNOSIS — I452 Bifascicular block: Secondary | ICD-10-CM | POA: Insufficient documentation

## 2016-10-31 DIAGNOSIS — E119 Type 2 diabetes mellitus without complications: Secondary | ICD-10-CM | POA: Insufficient documentation

## 2016-10-31 DIAGNOSIS — F988 Other specified behavioral and emotional disorders with onset usually occurring in childhood and adolescence: Secondary | ICD-10-CM | POA: Diagnosis not present

## 2016-10-31 DIAGNOSIS — K625 Hemorrhage of anus and rectum: Secondary | ICD-10-CM | POA: Insufficient documentation

## 2016-10-31 DIAGNOSIS — F4322 Adjustment disorder with anxiety: Secondary | ICD-10-CM | POA: Insufficient documentation

## 2016-10-31 DIAGNOSIS — I459 Conduction disorder, unspecified: Secondary | ICD-10-CM | POA: Diagnosis not present

## 2016-10-31 HISTORY — DX: Headache: R51

## 2016-10-31 HISTORY — DX: Presence of cardiac pacemaker: Z95.0

## 2016-10-31 HISTORY — DX: Headache, unspecified: R51.9

## 2016-10-31 HISTORY — DX: Cardiac murmur, unspecified: R01.1

## 2016-10-31 LAB — CBC
HCT: 43 % (ref 39.0–52.0)
HEMOGLOBIN: 14.4 g/dL (ref 13.0–17.0)
MCH: 29.3 pg (ref 26.0–34.0)
MCHC: 33.5 g/dL (ref 30.0–36.0)
MCV: 87.4 fL (ref 78.0–100.0)
PLATELETS: 195 10*3/uL (ref 150–400)
RBC: 4.92 MIL/uL (ref 4.22–5.81)
RDW: 13.8 % (ref 11.5–15.5)
WBC: 8.7 10*3/uL (ref 4.0–10.5)

## 2016-10-31 LAB — SURGICAL PCR SCREEN
MRSA, PCR: NEGATIVE
Staphylococcus aureus: POSITIVE — AB

## 2016-10-31 LAB — BASIC METABOLIC PANEL
ANION GAP: 10 (ref 5–15)
BUN: 16 mg/dL (ref 6–20)
CALCIUM: 9.3 mg/dL (ref 8.9–10.3)
CO2: 25 mmol/L (ref 22–32)
CREATININE: 0.75 mg/dL (ref 0.61–1.24)
Chloride: 105 mmol/L (ref 101–111)
Glucose, Bld: 150 mg/dL — ABNORMAL HIGH (ref 65–99)
Potassium: 3.9 mmol/L (ref 3.5–5.1)
SODIUM: 140 mmol/L (ref 135–145)

## 2016-10-31 NOTE — Pre-Procedure Instructions (Signed)
Jeremy Abbott  10/31/2016      [facility] [place] [place] [address] Phone: [phone] Fax: [phone]    Your procedure is scheduled on Tuesday December 5.  Report to [facility] Admitting at 11:30 A.M.  Call this number if you have problems the morning of surgery:  [phone]   Remember:  Do not eat food or drink liquids after midnight.  Take these medicines the morning of surgery with A SIP OF WATER: hydrocodone (Norco), flonase if needed, tamsulosin (flomax)  7 days prior to surgery STOP taking any, Excedrin migraine, Aspirin, Aleve, Naproxen, Ibuprofen, Motrin, Advil, Goody's, BC's, all herbal medications, fish oil, and all vitamins   WHAT DO I DO ABOUT MY DIABETES MEDICATION?   Marland Kitchen. Do not take oral diabetes medicines (pills) the morning of surgery. DO NOT take metformin (Glucophage) the day of surgery   How to Manage Your Diabetes Before and After Surgery  Why is it important to control my blood sugar before and after surgery? . Improving blood sugar levels before and after surgery helps healing and can limit problems. . A way of improving blood sugar control is eating a healthy diet by: o  Eating less sugar and carbohydrates o  Increasing activity/exercise o  Talking with your doctor about reaching your blood sugar goals . High blood sugars (greater than 180 mg/dL) can raise your risk of infections and slow your recovery, so you will need to focus on controlling your diabetes during the weeks before surgery. . Make sure that the doctor who takes care of your diabetes knows about your planned surgery including the date and location.  How do I manage my blood sugar before surgery? . Check your blood sugar at least 4 times a day, starting 2 days before surgery, to make sure that the level is not too high or low. o Check your blood sugar the morning of your surgery when you  wake up and every 2 hours until you get to the Short Stay unit. . If your blood sugar is less than 70 mg/dL, you will need to treat for low blood sugar: o Do not take insulin. o Treat a low blood sugar (less than 70 mg/dL) with  cup of clear juice (cranberry or apple), 4 glucose tablets, OR glucose gel. o Recheck blood sugar in 15 minutes after treatment (to make sure it is greater than 70 mg/dL). If your blood sugar is not greater than 70 mg/dL on recheck, call [phone] for further instructions. . Report your blood sugar to the short stay nurse when you get to Short Stay.  . If you are admitted to the hospital after surgery: o Your blood sugar will be checked by the staff and you will probably be given insulin after surgery (instead of oral diabetes medicines) to make sure you have good blood sugar levels. o The goal for blood sugar control after surgery is 80-180 mg/dL.                Do not wear jewelry, make-up or nail polish.  Do not wear lotions, powders, or perfumes, or deoderant.  Do not shave 48 hours prior to surgery.  Men may shave face and neck.  Do not bring valuables to the hospital.  [facility] is not responsible for any belongings or valuables.  Contacts, dentures or bridgework may not be worn into surgery.  Leave your suitcase  in the car.  After surgery it may be brought to your room.  For patients admitted to the hospital, discharge time will be determined by your treatment team.  Patients discharged the day of surgery will not be allowed to drive home.    Special instructions:    [name]- Preparing For Surgery  Before surgery, you can play an important role. Because skin is not sterile, your skin needs to be as free of germs as possible. You can reduce the number of germs on your skin by washing with CHG (chlorahexidine gluconate) Soap before surgery.  CHG is an antiseptic cleaner which kills germs and bonds with the skin to continue killing germs  even after washing.  Please do not use if you have an allergy to CHG or antibacterial soaps. If your skin becomes reddened/irritated stop using the CHG.  Do not shave (including legs and underarms) for at least 48 hours prior to first CHG shower. It is OK to shave your face.  Please follow these instructions carefully.   1. Shower the NIGHT BEFORE SURGERY and the MORNING OF SURGERY with CHG.   2. If you chose to wash your hair, wash your hair first as usual with your normal shampoo.  3. After you shampoo, rinse your hair and body thoroughly to remove the shampoo.  4. Use CHG as you would any other liquid soap. You can apply CHG directly to the skin and wash gently with a scrungie or a clean washcloth.   5. Apply the CHG Soap to your body ONLY FROM THE NECK DOWN.  Do not use on open wounds or open sores. Avoid contact with your eyes, ears, mouth and genitals (private parts). Wash genitals (private parts) with your normal soap.  6. Wash thoroughly, paying special attention to the area where your surgery will be performed.  7. Thoroughly rinse your body with warm water from the neck down.  8. DO NOT shower/wash with your normal soap after using and rinsing off the CHG Soap.  9. Pat yourself dry with a CLEAN TOWEL.   10. Wear CLEAN PAJAMAS   11. Place CLEAN SHEETS on your bed the night of your first shower and DO NOT SLEEP WITH PETS.    Day of Surgery: Do not apply any deodorants/lotions. Please wear clean clothes to the hospital/surgery center.      Please read over the following fact sheets that you were given. MRSA Information

## 2016-10-31 NOTE — Progress Notes (Addendum)
PCP: Jeremy Abbott Pt denies cardiac hx other than PPM, no cardiogist.   Pt sees Dr. Graciela HusbandsKlein for EP.   EKG; 07/24/16 CXR: 07/21/16  Pt states he is having his PPM replaced as well as lead change? Pt states he will call Dr. Lubertha Basqueaylor's office to clarify this. Pt states he has a small bump on his hand that he Is unsure of where it came from, area is swollen but not red, and states he will notify Dr. Ladona Ridgelaylor of this as well. Pt encouraged to follow up with PCP about area if needed.  Pt does not check CBG at home, states his last A1c was 5.4 at PCP, record requested from office. CBG was not checked at PAT appointment, glucose on BMET 150  No complaints of chest pain, SOB at PAT appointment.

## 2016-10-31 NOTE — Progress Notes (Signed)
[name] with [employer] notified that pt will be coming for lead extraction on 11/06/16 at 1:30 PM.

## 2016-10-31 NOTE — Telephone Encounter (Signed)
New Message  Meredith [facility] voiced there is no order for the Pre-Op for pts procedure.  Please f/u with Sharyl NimrodMeredith

## 2016-10-31 NOTE — Progress Notes (Signed)
PCR positive or MSSA, pt notified and prescription called to pharmacy

## 2016-10-31 NOTE — Telephone Encounter (Signed)
Dr Ladona Ridgelaylor or Gypsy BalsamAmber Seiler, NP will put the orders in for this procedure

## 2016-11-06 ENCOUNTER — Ambulatory Visit (HOSPITAL_COMMUNITY): Payer: Federal, State, Local not specified - PPO | Admitting: Emergency Medicine

## 2016-11-06 ENCOUNTER — Encounter (HOSPITAL_COMMUNITY)
Admission: RE | Disposition: A | Discharge status: Home / Self Care | Payer: Self-pay | Source: Ambulatory Visit | Attending: Internal Medicine

## 2016-11-06 ENCOUNTER — Ambulatory Visit (HOSPITAL_COMMUNITY): Payer: Federal, State, Local not specified - PPO | Admitting: Anesthesiology

## 2016-11-06 ENCOUNTER — Ambulatory Visit (HOSPITAL_COMMUNITY)
Admission: RE | Admit: 2016-11-06 | Discharge: 2016-11-07 | Disposition: A | Discharge status: Home / Self Care | Payer: Federal, State, Local not specified - PPO | Source: Ambulatory Visit | Attending: Internal Medicine | Admitting: Internal Medicine

## 2016-11-06 ENCOUNTER — Ambulatory Visit (HOSPITAL_COMMUNITY): Payer: Federal, State, Local not specified - PPO

## 2016-11-06 ENCOUNTER — Encounter (HOSPITAL_COMMUNITY): Payer: Self-pay | Admitting: *Deleted

## 2016-11-06 DIAGNOSIS — T82110D Breakdown (mechanical) of cardiac electrode, subsequent encounter: Secondary | ICD-10-CM | POA: Diagnosis not present

## 2016-11-06 DIAGNOSIS — F4323 Adjustment disorder with mixed anxiety and depressed mood: Secondary | ICD-10-CM | POA: Insufficient documentation

## 2016-11-06 DIAGNOSIS — Y713 Surgical instruments, materials and cardiovascular devices (including sutures) associated with adverse incidents: Secondary | ICD-10-CM | POA: Insufficient documentation

## 2016-11-06 DIAGNOSIS — Z87891 Personal history of nicotine dependence: Secondary | ICD-10-CM | POA: Insufficient documentation

## 2016-11-06 DIAGNOSIS — I10 Essential (primary) hypertension: Secondary | ICD-10-CM | POA: Insufficient documentation

## 2016-11-06 DIAGNOSIS — E119 Type 2 diabetes mellitus without complications: Secondary | ICD-10-CM | POA: Diagnosis not present

## 2016-11-06 DIAGNOSIS — Z8249 Family history of ischemic heart disease and other diseases of the circulatory system: Secondary | ICD-10-CM | POA: Insufficient documentation

## 2016-11-06 DIAGNOSIS — I442 Atrioventricular block, complete: Secondary | ICD-10-CM | POA: Insufficient documentation

## 2016-11-06 DIAGNOSIS — Z7982 Long term (current) use of aspirin: Secondary | ICD-10-CM | POA: Insufficient documentation

## 2016-11-06 DIAGNOSIS — Z7984 Long term (current) use of oral hypoglycemic drugs: Secondary | ICD-10-CM | POA: Diagnosis not present

## 2016-11-06 DIAGNOSIS — J449 Chronic obstructive pulmonary disease, unspecified: Secondary | ICD-10-CM | POA: Diagnosis not present

## 2016-11-06 DIAGNOSIS — Z45010 Encounter for checking and testing of cardiac pacemaker pulse generator [battery]: Secondary | ICD-10-CM | POA: Insufficient documentation

## 2016-11-06 DIAGNOSIS — E785 Hyperlipidemia, unspecified: Secondary | ICD-10-CM | POA: Insufficient documentation

## 2016-11-06 DIAGNOSIS — E876 Hypokalemia: Secondary | ICD-10-CM | POA: Diagnosis not present

## 2016-11-06 DIAGNOSIS — F419 Anxiety disorder, unspecified: Secondary | ICD-10-CM | POA: Diagnosis not present

## 2016-11-06 DIAGNOSIS — Z833 Family history of diabetes mellitus: Secondary | ICD-10-CM | POA: Insufficient documentation

## 2016-11-06 DIAGNOSIS — I878 Other specified disorders of veins: Secondary | ICD-10-CM

## 2016-11-06 DIAGNOSIS — F909 Attention-deficit hyperactivity disorder, unspecified type: Secondary | ICD-10-CM | POA: Diagnosis not present

## 2016-11-06 DIAGNOSIS — T82110A Breakdown (mechanical) of cardiac electrode, initial encounter: Secondary | ICD-10-CM | POA: Diagnosis not present

## 2016-11-06 DIAGNOSIS — Z950 Presence of cardiac pacemaker: Secondary | ICD-10-CM

## 2016-11-06 HISTORY — PX: TEE WITHOUT CARDIOVERSION: SHX5443

## 2016-11-06 HISTORY — PX: PACEMAKER LEAD REMOVAL: SHX5064

## 2016-11-06 HISTORY — PX: EP IMPLANTABLE DEVICE: SHX172B

## 2016-11-06 LAB — ECHO INTRAOPERATIVE TEE
Height: 69.5 in
Weight: 3713.6 oz

## 2016-11-06 LAB — GLUCOSE, CAPILLARY
GLUCOSE-CAPILLARY: 95 mg/dL (ref 65–99)
GLUCOSE-CAPILLARY: 80 mg/dL (ref 65–99)
Glucose-Capillary: 113 mg/dL — ABNORMAL HIGH (ref 65–99)
Glucose-Capillary: 120 mg/dL — ABNORMAL HIGH (ref 65–99)

## 2016-11-06 SURGERY — REMOVAL, ELECTRODE LEAD, CARDIAC PACEMAKER, WITHOUT REPLACEMENT
Anesthesia: General | Site: Chest | Laterality: Right

## 2016-11-06 MED ORDER — OXYCODONE HCL 5 MG/5ML PO SOLN: 5.0000 mg | Freq: Once | ORAL | Status: AC | PRN

## 2016-11-06 MED ORDER — CHLORHEXIDINE GLUCONATE 4 % EX LIQD: 60.0000 mL | Freq: Once | CUTANEOUS | Status: DC

## 2016-11-06 MED ORDER — ALBUMIN HUMAN 5 % IV SOLN
INTRAVENOUS | Status: DC | PRN
  Administered 2016-11-06: 15:00:00 via INTRAVENOUS

## 2016-11-06 MED ORDER — IBUPROFEN 600 MG PO TABS: 600.0000 mg | ORAL_TABLET | Freq: Three times a day (TID) | ORAL | Status: DC | PRN

## 2016-11-06 MED ORDER — FENTANYL CITRATE (PF) 250 MCG/5ML IJ SOLN
INTRAMUSCULAR | Status: AC
  Filled 2016-11-06: qty 5

## 2016-11-06 MED ORDER — ROCURONIUM BROMIDE 100 MG/10ML IV SOLN
INTRAVENOUS | Status: DC | PRN
  Administered 2016-11-06: 50 mg via INTRAVENOUS
  Administered 2016-11-06: 20 mg via INTRAVENOUS

## 2016-11-06 MED ORDER — SUCCINYLCHOLINE CHLORIDE 20 MG/ML IJ SOLN
INTRAMUSCULAR | Status: DC | PRN
  Administered 2016-11-06: 120 mg via INTRAVENOUS

## 2016-11-06 MED ORDER — LIDOCAINE HCL (CARDIAC) 20 MG/ML IV SOLN
INTRAVENOUS | Status: DC | PRN
  Administered 2016-11-06: 50 mg via INTRAVENOUS

## 2016-11-06 MED ORDER — OXYCODONE HCL 5 MG PO TABS
ORAL_TABLET | ORAL | Status: AC
  Filled 2016-11-06: qty 1

## 2016-11-06 MED ORDER — VANCOMYCIN HCL IN DEXTROSE 1-5 GM/200ML-% IV SOLN
INTRAVENOUS | Status: AC
  Filled 2016-11-06: qty 200

## 2016-11-06 MED ORDER — ONDANSETRON HCL 4 MG/2ML IJ SOLN: 4.0000 mg | Freq: Four times a day (QID) | INTRAMUSCULAR | Status: DC | PRN

## 2016-11-06 MED ORDER — HYDROCODONE-ACETAMINOPHEN 7.5-325 MG PO TABS
ORAL_TABLET | ORAL | Status: AC
  Filled 2016-11-06: qty 1

## 2016-11-06 MED ORDER — METFORMIN HCL ER 500 MG PO TB24
1000.0000 mg | ORAL_TABLET | Freq: Every day | ORAL | Status: DC
  Administered 2016-11-07: 1000 mg via ORAL
  Filled 2016-11-06: qty 2

## 2016-11-06 MED ORDER — VANCOMYCIN HCL 10 G IV SOLR
1500.0000 mg | Freq: Two times a day (BID) | INTRAVENOUS | Status: AC
  Administered 2016-11-07: 1500 mg via INTRAVENOUS
  Filled 2016-11-06: qty 1500

## 2016-11-06 MED ORDER — VANCOMYCIN HCL 10 G IV SOLR
1500.0000 mg | INTRAVENOUS | Status: AC
  Administered 2016-11-06: 1000 mg via INTRAVENOUS
  Filled 2016-11-06: qty 1500

## 2016-11-06 MED ORDER — LIDOCAINE 2% (20 MG/ML) 5 ML SYRINGE
INTRAMUSCULAR | Status: AC
  Filled 2016-11-06: qty 5

## 2016-11-06 MED ORDER — ONDANSETRON HCL 4 MG/2ML IJ SOLN
INTRAMUSCULAR | Status: DC | PRN
  Administered 2016-11-06: 4 mg via INTRAVENOUS
  Administered 2016-11-06: 8 mg via INTRAVENOUS

## 2016-11-06 MED ORDER — ACETAMINOPHEN 325 MG PO TABS
325.0000 mg | ORAL_TABLET | ORAL | Status: DC | PRN
  Administered 2016-11-07: 650 mg via ORAL
  Filled 2016-11-06: qty 2

## 2016-11-06 MED ORDER — FENTANYL CITRATE (PF) 100 MCG/2ML IJ SOLN
25.0000 ug | INTRAMUSCULAR | Status: DC | PRN
  Administered 2016-11-06: 50 ug via INTRAVENOUS

## 2016-11-06 MED ORDER — FENTANYL CITRATE (PF) 100 MCG/2ML IJ SOLN
INTRAMUSCULAR | Status: AC
  Administered 2016-11-06: 50 ug via INTRAVENOUS
  Filled 2016-11-06: qty 2

## 2016-11-06 MED ORDER — ONDANSETRON HCL 4 MG/2ML IJ SOLN
INTRAMUSCULAR | Status: AC
  Filled 2016-11-06: qty 2

## 2016-11-06 MED ORDER — LIDOCAINE HCL (PF) 1 % IJ SOLN
INTRAMUSCULAR | Status: AC
  Filled 2016-11-06: qty 30

## 2016-11-06 MED ORDER — PROPOFOL 10 MG/ML IV BOLUS
INTRAVENOUS | Status: AC
  Filled 2016-11-06: qty 20

## 2016-11-06 MED ORDER — TAMSULOSIN HCL 0.4 MG PO CAPS
0.4000 mg | ORAL_CAPSULE | Freq: Every day | ORAL | Status: DC
  Administered 2016-11-07: 0.4 mg via ORAL
  Filled 2016-11-06: qty 1

## 2016-11-06 MED ORDER — FENTANYL CITRATE (PF) 100 MCG/2ML IJ SOLN
INTRAMUSCULAR | Status: DC | PRN
  Administered 2016-11-06: 100 ug via INTRAVENOUS
  Administered 2016-11-06: 50 ug via INTRAVENOUS
  Administered 2016-11-06: 100 ug via INTRAVENOUS

## 2016-11-06 MED ORDER — MIDAZOLAM HCL 2 MG/2ML IJ SOLN
INTRAMUSCULAR | Status: AC
  Filled 2016-11-06: qty 2

## 2016-11-06 MED ORDER — HYDROCODONE-ACETAMINOPHEN 7.5-325 MG PO TABS
1.0000 | ORAL_TABLET | Freq: Four times a day (QID) | ORAL | Status: DC | PRN
  Administered 2016-11-06 – 2016-11-07 (×2): 1 via ORAL
  Filled 2016-11-06 (×2): qty 1

## 2016-11-06 MED ORDER — LACTATED RINGERS IV SOLN
INTRAVENOUS | Status: DC
  Administered 2016-11-06 (×3): via INTRAVENOUS

## 2016-11-06 MED ORDER — HYDROCODONE-ACETAMINOPHEN 7.5-325 MG PO TABS
1.0000 | ORAL_TABLET | Freq: Every day | ORAL | Status: DC | PRN
Start: 2016-11-06 — End: 2016-11-06
  Administered 2016-11-06: 1 via ORAL

## 2016-11-06 MED ORDER — ONDANSETRON HCL 4 MG/2ML IJ SOLN: 4.0000 mg | Freq: Once | INTRAMUSCULAR | Status: DC | PRN

## 2016-11-06 MED ORDER — EPHEDRINE SULFATE 50 MG/ML IJ SOLN
INTRAMUSCULAR | Status: DC | PRN
  Administered 2016-11-06: 10 mg via INTRAVENOUS

## 2016-11-06 MED ORDER — PROPOFOL 10 MG/ML IV BOLUS
INTRAVENOUS | Status: DC | PRN
  Administered 2016-11-06: 140 mg via INTRAVENOUS

## 2016-11-06 MED ORDER — SODIUM CHLORIDE 0.9 % IV SOLN
INTRAVENOUS | Status: DC | PRN
  Administered 2016-11-06: 15:00:00 500 mL

## 2016-11-06 MED ORDER — LIDOCAINE HCL (PF) 1 % IJ SOLN
INTRAMUSCULAR | Status: DC | PRN
  Administered 2016-11-06: 30 mL

## 2016-11-06 MED ORDER — MIDAZOLAM HCL 2 MG/2ML IJ SOLN
1.0000 mg | Freq: Once | INTRAMUSCULAR | Status: AC
  Administered 2016-11-06: 1 mg via INTRAVENOUS

## 2016-11-06 MED ORDER — SODIUM CHLORIDE 0.9 % IV SOLN: INTRAVENOUS | Status: DC | PRN

## 2016-11-06 MED ORDER — SODIUM CHLORIDE 0.9 % IV SOLN: INTRAVENOUS | Status: DC

## 2016-11-06 MED ORDER — MIDAZOLAM HCL 2 MG/2ML IJ SOLN
INTRAMUSCULAR | Status: AC
  Administered 2016-11-06: 1 mg via INTRAVENOUS
  Filled 2016-11-06: qty 2

## 2016-11-06 MED ORDER — SODIUM CHLORIDE 0.9 % IR SOLN
80.0000 mg | Status: DC
  Filled 2016-11-06 (×2): qty 2

## 2016-11-06 MED ORDER — LISINOPRIL 10 MG PO TABS
20.0000 mg | ORAL_TABLET | Freq: Every day | ORAL | Status: DC
  Administered 2016-11-07: 20 mg via ORAL
  Filled 2016-11-06: qty 2

## 2016-11-06 MED ORDER — OXYCODONE HCL 5 MG PO TABS
5.0000 mg | ORAL_TABLET | Freq: Once | ORAL | Status: AC | PRN
  Administered 2016-11-06: 5 mg via ORAL

## 2016-11-06 MED ORDER — SUGAMMADEX SODIUM 500 MG/5ML IV SOLN
INTRAVENOUS | Status: DC | PRN
  Administered 2016-11-06: 300 mg via INTRAVENOUS

## 2016-11-06 MED ORDER — PHENYLEPHRINE HCL 10 MG/ML IJ SOLN
INTRAVENOUS | Status: DC | PRN
  Administered 2016-11-06: 25 ug/min via INTRAVENOUS

## 2016-11-06 MED ORDER — GENTAMICIN SULFATE 40 MG/ML IJ SOLN
INTRAMUSCULAR | Status: DC | PRN
  Administered 2016-11-06: 500 mL

## 2016-11-06 MED ORDER — MIDAZOLAM HCL 5 MG/5ML IJ SOLN
INTRAMUSCULAR | Status: DC | PRN
  Administered 2016-11-06: 2 mg via INTRAVENOUS

## 2016-11-06 SURGICAL SUPPLY — 53 items
ADAPTER UNIV SWAN GANZ BIP (ADAPTER) IMPLANT
ADAPTER UNV SWAN GANZ BIP (ADAPTER) ×1
ADPR CATH UNV NS SG CATH (ADAPTER) ×3
BAG BANDED W/RUBBER/TAPE 36X54 (MISCELLANEOUS) ×4 IMPLANT
BAG EQP BAND 135X91 W/RBR TAPE (MISCELLANEOUS) ×3
BLADE 10 SAFETY STRL DISP (BLADE) ×3 IMPLANT
BLADE OSCILLATING /SAGITTAL (BLADE) IMPLANT
BLADE STERNUM SYSTEM 6 (BLADE) ×3 IMPLANT
BNDG ADH 5X4 AIR PERM ELC (GAUZE/BANDAGES/DRESSINGS)
BNDG COHESIVE 4X5 WHT NS (GAUZE/BANDAGES/DRESSINGS) IMPLANT
CANISTER SUCTION 2500CC (MISCELLANEOUS) ×4 IMPLANT
CATH S G BIP PACING (SET/KITS/TRAYS/PACK) ×1 IMPLANT
COIL ONE TIE COMPRESSION (MISCELLANEOUS) ×1 IMPLANT
COVER TABLE BACK 60X90 (DRAPES) ×4 IMPLANT
DRAPE CARDIOVASCULAR INCISE (DRAPES) ×4
DRAPE SRG 135X102X78XABS (DRAPES) ×3 IMPLANT
DRSG OPSITE 6X11 MED (GAUZE/BANDAGES/DRESSINGS) IMPLANT
DRSG TEGADERM 4X4.75 (GAUZE/BANDAGES/DRESSINGS) ×2 IMPLANT
ELECT REM PT RETURN 9FT ADLT (ELECTROSURGICAL) ×8
ELECTRODE REM PT RTRN 9FT ADLT (ELECTROSURGICAL) ×6 IMPLANT
GAUZE SPONGE 2X2 8PLY STRL LF (GAUZE/BANDAGES/DRESSINGS) IMPLANT
GAUZE SPONGE 4X4 12PLY STRL (GAUZE/BANDAGES/DRESSINGS) IMPLANT
GAUZE SPONGE 4X4 16PLY XRAY LF (GAUZE/BANDAGES/DRESSINGS) IMPLANT
GLOVE BIOGEL PI IND STRL 6.5 (GLOVE) IMPLANT
GLOVE BIOGEL PI IND STRL 7.5 (GLOVE) ×3 IMPLANT
GLOVE BIOGEL PI INDICATOR 6.5 (GLOVE) ×3
GLOVE BIOGEL PI INDICATOR 7.5 (GLOVE) ×2
GLOVE ECLIPSE 7.0 STRL STRAW (GLOVE) ×1 IMPLANT
GLOVE ECLIPSE 8.0 STRL XLNG CF (GLOVE) ×4 IMPLANT
GOWN STRL REUS W/ TWL LRG LVL3 (GOWN DISPOSABLE) IMPLANT
GOWN STRL REUS W/ TWL XL LVL3 (GOWN DISPOSABLE) ×3 IMPLANT
GOWN STRL REUS W/TWL LRG LVL3 (GOWN DISPOSABLE)
GOWN STRL REUS W/TWL XL LVL3 (GOWN DISPOSABLE) ×4
GUIDEWIRE ANGLED .035X150CM (WIRE) ×1 IMPLANT
KIT ROOM TURNOVER OR (KITS) ×4 IMPLANT
LEAD CAPSURE NOVUS 5076-58CM (Lead) ×1 IMPLANT
NDL PERC 18GX7CM (NEEDLE) IMPLANT
NEEDLE PERC 18GX7CM (NEEDLE) ×4 IMPLANT
PAD ARMBOARD 7.5X6 YLW CONV (MISCELLANEOUS) ×8 IMPLANT
PAD ELECT DEFIB RADIOL ZOLL (MISCELLANEOUS) ×4 IMPLANT
PPM ADVISA MRI DR A2DR01 (Pacemaker) ×1 IMPLANT
SHEATH EVOLUTION SHORTE RL 11F (SHEATH) ×1 IMPLANT
SHEATH PINNACLE 6F 10CM (SHEATH) ×3 IMPLANT
SPONGE GAUZE 2X2 STER 10/PKG (GAUZE/BANDAGES/DRESSINGS) ×2
STYLET LIBERATOR LOCKING (MISCELLANEOUS) ×1 IMPLANT
SUT PROLENE 2 0 SH DA (SUTURE) IMPLANT
SUT SILK 1 SH (SUTURE) ×2 IMPLANT
SUT VIC AB 3-0 X1 27 (SUTURE) ×1 IMPLANT
TOWEL OR 17X24 6PK STRL BLUE (TOWEL DISPOSABLE) ×7 IMPLANT
TOWEL OR 17X26 10 PK STRL BLUE (TOWEL DISPOSABLE) ×7 IMPLANT
TRAY FOLEY IC TEMP SENS 16FR (CATHETERS) ×4 IMPLANT
TUBE CONNECTING 12X1/4 (SUCTIONS) ×1 IMPLANT
YANKAUER SUCT BULB TIP NO VENT (SUCTIONS) ×1 IMPLANT

## 2016-11-06 NOTE — Anesthesia Postprocedure Evaluation (Addendum)
Anesthesia Post Note  Patient: Shawn StallGeorge P Pugh  Procedure(s) Performed: Procedure(s) (LRB): PACEMAKER LEAD EXTRACTION (Left) TRANSESOPHAGEAL ECHOCARDIOGRAM (TEE) (N/A) INSERTION of new right ventricle pacemaker lead  and insertion of new pacemaker (Right)  Patient location during evaluation: PACU Anesthesia Type: General Level of consciousness: awake, awake and alert and oriented Pain management: pain level controlled Respiratory status: spontaneous breathing, nonlabored ventilation and respiratory function stable Cardiovascular status: blood pressure returned to baseline Anesthetic complications: no    Last Vitals:  Vitals:   11/06/16 1710 11/06/16 1755  BP: 104/63 107/66  Pulse: 86 75  Resp: 15 16  Temp:      Last Pain:  Vitals:   11/06/16 1810  TempSrc:   PainSc: 5                  Martel Galvan COKER

## 2016-11-06 NOTE — Progress Notes (Signed)
Orthopedic Tech Progress Note Patient Details:  Shawn StallGeorge P Nicolosi 08/14/1946 [id number]  Ortho Devices Type of Ortho Device: Arm sling Ortho Device/Splint Location: LUE Ortho Device/Splint Interventions: Ordered, Application   Jennye MoccasinHughes, Nasire Reali Craig 11/06/2016, 5:52 PM

## 2016-11-06 NOTE — Discharge Instructions (Signed)
° ° °  Supplemental Discharge Instructions for  Pacemaker/Defibrillator Patients  Activity No heavy lifting or vigorous activity with your left/right arm for 6 to 8 weeks.  Do not raise your left/right arm above your head for one week.  Gradually raise your affected arm as drawn below.              11/10/16                   11/11/16                  11/12/16                  11/13/16 __  NO DRIVING for 1 week    ; you may begin driving on 65/78/4611/11/19 .  WOUND CARE - Keep the wound area clean and dry.  Do not get this area wet, no showers until cleared to at your wound check - The tape/steri-strips on your wound will fall off; do not pull them off.  No bandage is needed on the site.  DO  NOT apply any creams, oils, or ointments to the wound area. - If you notice any drainage or discharge from the wound, any swelling or bruising at the site, or you develop a fever > 101? F after you are discharged home, call the office at once.  Special Instructions - You are still able to use cellular telephones; use the ear opposite the side where you have your pacemaker/defibrillator.  Avoid carrying your cellular phone near your device. - When traveling through airports, show security personnel your identification card to avoid being screened in the metal detectors.  Ask the security personnel to use the hand wand. - Avoid arc welding equipment, MRI testing (magnetic resonance imaging), TENS units (transcutaneous nerve stimulators).  Call the office for questions about other devices. - Avoid electrical appliances that are in poor condition or are not properly grounded. - Microwave ovens are safe to be near or to operate.  Additional information for defibrillator patients should your device go off: - If your device goes off ONCE and you feel fine afterward, notify the device clinic nurses. - If your device goes off ONCE and you do not feel well afterward, call 911. - If your device goes off TWICE, call  911. - If your device goes off THREE times in one day, call 911.  DO NOT DRIVE YOURSELF OR A FAMILY MEMBER WITH A DEFIBRILLATOR TO THE HOSPITAL--CALL 911.

## 2016-11-06 NOTE — Op Note (Signed)
EP procedure note  Procedure performed: Extraction of a right ventricular pacing lead with elevated pacing thresholds, insertion of a temporary transvenous pacing lead by way of the left femoral vein, insertion of a new active fixation right ventricular permanent pacing lead, removal of a previous implanted dual-chamber pacemaker which had less than 2 years of battery longevity, and insertion of a new dual-chamber pacemaker utilizing the previously implanted atrial pacing lead  Preoperative diagnosis: Right ventricular lead failure  Postoperative diagnosis: Same as preoperative diagnosis  Description of the procedure: After informed consent was obtained, the patient was taken to the operating room in the fasting condition. The anesthesia service was utilized to apply general endotracheal anesthesia. Invasive hemodynamic monitoring was carried out in the right radial artery. A 6 French sheath was inserted into the left femoral vein and a temporary transvenous pacing lead was inserted under fluoroscopic guidance into the right ventricle. Pacing threshold was less than a volt at 0.5 ms. A 6 French venous sheath was inserted into the right femoral vein.  Attention was then turned to the patient's pacing system. 30 cc of lidocaine was infiltrated into the left pectoral region. A 5 cm incision was carried out. Electrocautery was utilized to dissect down to the pacemaker pocket. The pacemaker generator was removed with gentle traction. Electrocautery was utilized to dissect free the atrial and ventricular pacing leads. The atrial lead was evaluated, and found to be working satisfactorily. The left subclavian vein was punctured. A Glidewire was inserted into the vein but initially would not traverse the subtotal occlusion in the left subclavian vein. After approximately 5 minutes, the Glidewire was able to get down into the central circulation. The right ventricular lead was freed up of its dense fibrous scar  tissue. The sewing sleeve was removed. A stylette was inserted into the lead and the helix of the lead was retracted. Gentle traction was placed on the lead but it was stuck in the heart. At this point the stylette was removed and a Cook locking stylette was inserted into the body of the lead and locked in place. A Cook one tie suture was attached to the proximal portion of the lead. A Cook RL 11 French short dissection sheath was advanced over the lead and into the central circulation. After approximately 4 cm of dissection into the central circulation, the lead was removed in total. There was no hemodynamic instability. At this point a long 7 French peel-away sheath was inserted over the Glidewire. The [employer] model [id number] active fixation pacing lead, serial number [id number], was advanced into the right ventricle under fluoroscopic guidance. Mapping was carried out. At the final site near the RV apical septum, R waves measured 20 mV, the pacing impedance was 1000 ohms, and the pacing threshold was a volt at 0.5 ms. The lead was secured to the fascia with silk suture. The sewing sleeve was secured with silk suture. The pocket was irrigated with antibiotic irrigation. Electrocautery was utilized to assure hemostasis. The new [employer] dual-chamber pacemaker, serial [id number], was connected to the old atrial lead and new right ventricular lead and placed back in the subcutaneous pocket. The pocket was irrigated with antibiotic irrigation. Electrocautery was utilized to assure hemostasis. The incision was closed with 2 layers of Vicryl suture. Benzoin and Steri-Strips were painted on the skin. A bandage was applied. The right and left femoral venous sheaths were removed and prior to this the temporary pacemaker was removed. The patient was returned to the recovery area in stable  condition.  Complications: There were no immediate procedure complications  Estimated blood loss: Less than 50 cc  Conclusion:  Successful extraction of a [employer] right ventricular pacing lead which had developed an elevated pacing threshold, successful insertion of a new [employer] right ventricular pacing lead, with removal of the old pacemaker, and insertion of a new pacemaker.  Lewayne BuntingGregg Saniah Schroeter, M.D.

## 2016-11-06 NOTE — Transfer of Care (Signed)
Immediate Anesthesia Transfer of Care Note  Patient: Jeremy StallGeorge P Akers  Procedure(s) Performed: Procedure(s) with comments: PACEMAKER LEAD EXTRACTION (Left) - DR. ZOXWRUEAGERHARDT TO BACKUP CASE TRANSESOPHAGEAL ECHOCARDIOGRAM (TEE) (N/A) INSERTION of new right ventricle pacemaker lead  and insertion of new pacemaker (Right)  Patient Location: PACU  Anesthesia Type:General  Level of Consciousness: awake, alert , oriented and patient cooperative  Airway & Oxygen Therapy: Patient Spontanous Breathing  Post-op Assessment: Report given to RN, Post -op Vital signs reviewed and stable and Patient moving all extremities  Post vital signs: Reviewed and stable  Last Vitals:  Vitals:   11/06/16 1120 11/06/16 1640  BP: 127/65   Pulse: 83   Resp: 20   Temp: 36.6 C 36.4 C    Last Pain:  Vitals:   11/06/16 1120  TempSrc: Oral      Patients Stated Pain Goal: 3 (11/06/16 1201)  Complications: No apparent anesthesia complications

## 2016-11-06 NOTE — Progress Notes (Signed)
  Echocardiogram Echocardiogram Transesophageal has been performed.  Nolon RodBrown, Tony 11/06/2016, 3:54 PM

## 2016-11-06 NOTE — Anesthesia Procedure Notes (Signed)
Procedure Name: Intubation Date/Time: 11/06/2016 2:25 PM Performed by: Ferol LuzMCMILLEN, Benard Minturn L Pre-anesthesia Checklist: Patient identified, Emergency Drugs available, Suction available and Patient being monitored Patient Re-evaluated:Patient Re-evaluated prior to inductionOxygen Delivery Method: Circle System Utilized Preoxygenation: Pre-oxygenation with 100% oxygen Intubation Type: IV induction Ventilation: Mask ventilation without difficulty Laryngoscope Size: Mac and 4 Grade View: Grade II Tube type: Oral Tube size: 7.5 mm Number of attempts: 1 Airway Equipment and Method: Stylet and Oral airway Placement Confirmation: ETT inserted through vocal cords under direct vision,  positive ETCO2 and breath sounds checked- equal and bilateral Secured at: 22 cm Tube secured with: Tape Dental Injury: Teeth and Oropharynx as per pre-operative assessment

## 2016-11-06 NOTE — Addendum Note (Signed)
Addendum  created 11/06/16 1842 by Kipp Broodavid Chayce Robbins, MD   Anesthesia Attestations filed, Sign clinical note

## 2016-11-06 NOTE — Anesthesia Postprocedure Evaluation (Signed)
Anesthesia Post Note  Patient: Jeremy Abbott  Procedure(s) Performed: Procedure(s) (LRB): PACEMAKER LEAD EXTRACTION (Left) TRANSESOPHAGEAL ECHOCARDIOGRAM (TEE) (N/A) INSERTION of new right ventricle pacemaker lead  and insertion of new pacemaker (Right)  Anesthesia Post Evaluation  Last Vitals:  Vitals:   11/06/16 1710 11/06/16 1755  BP: 104/63 107/66  Pulse: 86 75  Resp: 15 16  Temp:      Last Pain:  Vitals:   11/06/16 1810  TempSrc:   PainSc: 5                  Pressley Tadesse COKER

## 2016-11-06 NOTE — Anesthesia Preprocedure Evaluation (Addendum)
Anesthesia Evaluation  Patient identified by MRN, date of birth, ID band Patient awake    Reviewed: Allergy & Precautions, NPO status , Patient's Chart, lab work & pertinent test results  Airway Mallampati: II  TM Distance: >3 FB Neck ROM: Full    Dental  (+) Teeth Intact, Dental Advisory Given   Pulmonary former smoker,    breath sounds clear to auscultation       Cardiovascular hypertension,  Rhythm:Regular Rate:Normal     Neuro/Psych    GI/Hepatic   Endo/Other  diabetes  Renal/GU      Musculoskeletal   Abdominal   Peds  Hematology   Anesthesia Other Findings   Reproductive/Obstetrics                            Anesthesia Physical Anesthesia Plan  ASA: III  Anesthesia Plan: General   Post-op Pain Management:    Induction: Intravenous  Airway Management Planned: Oral ETT  Additional Equipment: Arterial line and CVP  Intra-op Plan:   Post-operative Plan: Extubation in OR  Informed Consent: I have reviewed the patients History and Physical, chart, labs and discussed the procedure including the risks, benefits and alternatives for the proposed anesthesia with the patient or authorized representative who has indicated his/her understanding and acceptance.   Dental advisory given  Plan Discussed with: CRNA and Anesthesiologist  Anesthesia Plan Comments:        Anesthesia Quick Evaluation

## 2016-11-06 NOTE — Anesthesia Procedure Notes (Signed)
Central Venous Catheter Insertion Performed by: anesthesiologist 11/06/2016 3:08 PM Patient location: OR. Preanesthetic checklist: patient identified, IV checked, site marked, risks and benefits discussed, surgical consent, monitors and equipment checked, pre-op evaluation and timeout performed Position: supine Landmarks identified and Seldinger technique used Catheter size: 8 Fr Central line was placed.Double lumen Procedure performed using ultrasound guided technique. Attempts: 1 Following insertion, line sutured, Biopatch and dressing applied. Post procedure assessment: blood return through all ports and free fluid flow. Patient tolerated the procedure well with no immediate complications.

## 2016-11-06 NOTE — Anesthesia Postprocedure Evaluation (Signed)
Anesthesia Post Note  Patient: Shawn StallGeorge P Delarocha  Procedure(s) Performed: Procedure(s) (LRB): PACEMAKER LEAD EXTRACTION (Left) TRANSESOPHAGEAL ECHOCARDIOGRAM (TEE) (N/A) INSERTION of new right ventricle pacemaker lead  and insertion of new pacemaker (Right)  Patient location during evaluation: PACU Anesthesia Type: General Level of consciousness: awake, awake and alert and oriented Pain management: pain level controlled Vital Signs Assessment: post-procedure vital signs reviewed and stable Respiratory status: spontaneous breathing, nonlabored ventilation and respiratory function stable Cardiovascular status: blood pressure returned to baseline Anesthetic complications: no    Last Vitals:  Vitals:   11/06/16 1655 11/06/16 1710  BP: 111/67 104/63  Pulse: 88 86  Resp: (!) 21 15  Temp:      Last Pain:  Vitals:   11/06/16 1725  TempSrc:   PainSc: 5                  Aulani Shipton COKER

## 2016-11-06 NOTE — H&P (Signed)
HPI [name] is referred today by Dr. 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 The patient has a h/o CHB and underwent PPM insertion 14 months ago. He has had syncope and was found to have his PPM output below his rising ventricular threshold. He had his output turned up and felt diaphragmatic stimulation. He has not passed out again but did note some dizziness which was related to dehydration. 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  No Known Allergies         Current Outpatient Prescriptions  Medication Sig Dispense Refill  . amphetamine-dextroamphetamine (ADDERALL XR) 25 MG 24 hr capsule Take 25 mg by mouth every morning.    Marland Kitchen. aspirin-acetaminophen-caffeine (EXCEDRIN MIGRAINE) 250-250-65 MG per tablet Take 2 tablets by mouth every 6 (six) hours as needed for headache.    . glucose blood (ONE TOUCH ULTRA TEST) test strip Once daily dx 250.00 100 each 11  . HYDROcodone-acetaminophen (NORCO) 7.5-325 MG tablet Take 1 tablet by mouth 4 (four) times daily as needed (pain).    Marland Kitchen. ibuprofen (ADVIL,MOTRIN) 200 MG tablet Take 200 mg by mouth 4 (four) times daily as needed for moderate pain.     . metFORMIN (GLUCOPHAGE-XR) 500 MG 24 hr tablet Take 1,000 mg by mouth every morning.     [name]. ONETOUCH DELICA LANCETS MISC Once daily dx 250.00 Once daily dx 250.00 100 each 11  . sildenafil (VIAGRA) 100 MG tablet Take 100 mg by mouth daily as needed for erectile dysfunction.    . simvastatin (ZOCOR) 40 MG tablet Take 1 tablet (40 mg total) by mouth at bedtime. 90 tablet 3  . traMADol (ULTRAM) 50 MG tablet Take 50 mg by mouth every 6 (six) hours as needed (pain).     No current facility-administered medications for this visit.          Past Medical History:  Diagnosis Date  . ADHD (attention deficit hyperactivity disorder)   . Adjustment disorder with anxiety   . Allergic rhinitis   . Anxiety   . Complete heart block  (HCC)    a. s/p Medtronic PPM 06/17/15 (device dependent).  . COPD (chronic obstructive pulmonary disease) (HCC) 11/12/2011  . Depression   . Diabetes mellitus (HCC)   . Essential hypertension   . Fracture of right clavicle 11/09/2009   Chronic right collarbone pain-non union  . Hyperlipidemia   . Hypokalemia   . NSAID long-term use   . RECTAL BLEEDING 01/22/2008    ROS:   All systems reviewed and negative except as noted in the HPI.        Past Surgical History:  Procedure Laterality Date  . EP IMPLANTABLE DEVICE N/A 06/17/2015   Procedure: Pacemaker Implant;  Surgeon: Duke SalviaSteven C Klein, MD;  [facility];  Service: Cardiovascular;  Laterality: N/A;  . ORIF CLAVICLE FRACTURE  2/08   s/p collarbone fx with Will BonnetFall-Murphy Wainer  . TONSILLECTOMY            Family History  Problem Relation Age of Onset  . Heart failure Father   . Diabetes Daughter   . Heart attack Neg Hx   . Stroke Neg Hx      Social History        Social History  . Marital status: Single    Spouse name: N/A  . Number of children: N/A  . Years of education: N/A        Occupational History  .  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 Smoking status: Former Games developermoker  . Smokeless tobacco: Never Used  . Alcohol use No  . Drug use: No  . Sexual activity: Not on file   Other Topics Concern  . Not on file      Social History Narrative  . No narrative on file     BP 138/80   Pulse (!) 102   Ht 5' 9.5" (1.765 m)   Wt 212 lb (96.2 kg)   BMI 30.86 kg/m   Physical Exam:  Well appearing 70 yo man, NAD HEENT: Unremarkable Neck:  6 cm JVD, no thyromegally Lymphatics:  No adenopathy Back:  No CVA tenderness Lungs:  Clear with no wheezes HEART:  Regular rate rhythm, no murmurs, no rubs, no clicks Abd:  soft, positive bowel sounds, no organomegally, no rebound, no guarding Ext:  2 plus pulses, no  edema, no cyanosis, no clubbing Skin:  No rashes no nodules Neuro:  CN II through XII intact, motor grossly intact  EKG - NSR with ventricular pacing  DEVICE  Normal device function.  See PaceArt for details.   Assess/Plan: 1. Ventricular lead dysfunction - I have discussed the treatment options with the patient. We will schedule an extraction with insertion of a new RV lead using a temporary lead in the groin.  I spent over an hour with the patient and his wife answering his questions, explaining the situation and reviewing the risks/benefits of the procedure.  Sharlot GowdaGregg Taylor,M.D.   EP Attending  Since the prior clinic visit, there has been no change in the history, exam, assessment and plan. He is stable to proceed with PPM lead revision. Will plan to place a temporary PM in the RV prior to removing the old RV lead.  Jeremy ReevesGregg Taylor,M.D.

## 2016-11-07 ENCOUNTER — Ambulatory Visit (HOSPITAL_COMMUNITY): Payer: Federal, State, Local not specified - PPO

## 2016-11-07 ENCOUNTER — Encounter (HOSPITAL_COMMUNITY): Payer: Self-pay | Admitting: Internal Medicine

## 2016-11-07 DIAGNOSIS — T82110A Breakdown (mechanical) of cardiac electrode, initial encounter: Secondary | ICD-10-CM | POA: Diagnosis not present

## 2016-11-07 DIAGNOSIS — Z45010 Encounter for checking and testing of cardiac pacemaker pulse generator [battery]: Secondary | ICD-10-CM | POA: Diagnosis not present

## 2016-11-07 DIAGNOSIS — I442 Atrioventricular block, complete: Secondary | ICD-10-CM | POA: Diagnosis not present

## 2016-11-07 DIAGNOSIS — J449 Chronic obstructive pulmonary disease, unspecified: Secondary | ICD-10-CM | POA: Diagnosis not present

## 2016-11-07 DIAGNOSIS — T82110D Breakdown (mechanical) of cardiac electrode, subsequent encounter: Secondary | ICD-10-CM | POA: Diagnosis not present

## 2016-11-07 LAB — GLUCOSE, CAPILLARY: GLUCOSE-CAPILLARY: 105 mg/dL — AB (ref 65–99)

## 2016-11-07 NOTE — Discharge Summary (Signed)
ELECTROPHYSIOLOGY PROCEDURE DISCHARGE SUMMARY    Patient ID: Jeremy Abbott,  MRN: [id number], DOB/AGE: 70/01/1946 70 y.o.  Admit date: 11/06/2016 Discharge date: 11/07/2016  Primary Care Physician: Lolita PatellaEADE,ROBERT ALEXANDER, MD Primary Cardiologist/Electrophysiologist: Graciela HusbandsKlein  Primary Discharge Diagnosis:  1. PPM RV lead failure     S/p RV lead extraction and new RV lead implant with new PPM   Secondary Discharge Diagnosis:  1. CHB 2. COPD 3. DM 4. HTN 5. HLD  Allergies  Allergen Reactions  . No Known Allergies      Procedures This Admission:  1.  RV lead extraction with implantation of a new RV lead and MDT dual chamber PPM on 11/06/16 by Dr Ladona Ridgelaylor.  The patient received a new Medtronic dual-chamber pacemaker, serial [id number] and Medtronic model 5076 active fixation pacing lead, serial number [id number] to the RV. There were no immediate post procedure complications. 2.  CXR on 11/07/16 demonstrated no pneumothorax status post device implantation.   Brief HPI: Jeremy StallGeorge P Robling is a 70 y.o. male was referred to electrophysiology in the outpatient setting for consideration of RV lead extraction secondary to rising thresholds and CHB .  Past medical history also includes HTN, DM, HLD, and COPD.  Risks, benefits, and alternatives to PPM implantation were reviewed with the patient who wished to proceed.   Hospital Course:  The patient was admitted and underwent his procedure with details as outlined above.  He was monitored on telemetry overnight which demonstrated SR, V paced.  Left chest was without hematoma or ecchymosis. Procedure sites are stable. The device was interrogated and found to be functioning normally.  CXR was obtained and demonstrated no pneumothorax status post device implantation.  Wound care, arm mobility, and restrictions were reviewed with the patient.  The patient was examined and considered stable for discharge to home.    Physical Exam: Vitals:   11/06/16 2352 11/07/16 0255 11/07/16 0449 11/07/16 0504  BP: (!) 102/57 (!) 99/59 (!) 89/55 (!) 99/58  Pulse:   70   Resp:   18   Temp:   97.9 F (36.6 C)   TempSrc:   Oral   SpO2:  96% 98%   Weight:      Height:        GEN- The patient is well appearing, alert and oriented x 3 today.   HEENT: normocephalic, atraumatic; sclera clear, conjunctiva pink; hearing intact; oropharynx clear; neck supple, no JVP Lungs- Clear to ausculation bilaterally, normal work of breathing.  No wheezes, rales, rhonchi Heart- Regular rate and rhythm, no murmurs, rubs or gallops, PMI not laterally displaced GI- soft, non-tender, non-distended Extremities- no clubbing, cyanosis, or edema; DP/PT/radial pulses 2+ bilaterally B/l groins soft without hematoma MS- no significant deformity or atrophy Skin- warm and dry, no rash or lesion, left chest without hematoma/ecchymosis Psych- euthymic mood, full affect Neuro- no gross deficits   Labs:   Lab Results  Component Value Date   WBC 8.7 10/31/2016   HGB 14.4 10/31/2016   HCT 43.0 10/31/2016   MCV 87.4 10/31/2016   PLT 195 10/31/2016     Recent Labs Lab 10/31/16 1120  NA 140  K 3.9  CL 105  CO2 25  BUN 16  CREATININE 0.75  CALCIUM 9.3  GLUCOSE 150*    Discharge Medications:    Medication List    TAKE these medications   amphetamine-dextroamphetamine 25 MG 24 hr capsule Commonly known as:  ADDERALL XR Take 25 mg by mouth every morning.  aspirin-acetaminophen-caffeine 250-250-65 MG tablet Commonly known as:  EXCEDRIN MIGRAINE Take 2 tablets by mouth every 6 (six) hours as needed for headache.   fluticasone 50 MCG/ACT nasal spray Commonly known as:  FLONASE Place 1-2 sprays into both nostrils daily as needed for allergies or rhinitis.   glucose blood test strip Commonly known as:  ONE TOUCH ULTRA TEST Once daily dx 250.00   HYDROcodone-acetaminophen 7.5-325 MG tablet Commonly known as:  NORCO Take 1 tablet by mouth daily as  needed (pain).   ibuprofen 200 MG tablet Commonly known as:  ADVIL,MOTRIN Take 600 mg by mouth every 8 (eight) hours as needed for moderate pain.   lisinopril 20 MG tablet Commonly known as:  PRINIVIL,ZESTRIL Take 20 mg by mouth daily.   metFORMIN 500 MG 24 hr tablet Commonly known as:  GLUCOPHAGE-XR Take 1,000 mg by mouth every morning.   ONETOUCH DELICA LANCETS Misc Once daily dx 250.00 Once daily dx 250.00   sildenafil 100 MG tablet Commonly known as:  VIAGRA Take 100 mg by mouth daily as needed for erectile dysfunction.   simvastatin 40 MG tablet Commonly known as:  ZOCOR Take 1 tablet (40 mg total) by mouth at bedtime.   tamsulosin 0.4 MG Caps capsule Commonly known as:  FLOMAX Take 0.4 mg by mouth daily.   ZINC PO Take 1 tablet by mouth daily.       Disposition:  Home Discharge Instructions    Diet - low sodium heart healthy    Complete by:  As directed    Increase activity slowly    Complete by:  As directed      Follow-up Information    [facility] [address] [facility] Follow up on 11/15/2016.   Specialty:  Cardiology Why:  11:00AM, wound check Contact information: [address], [address] [place] [id number] [phone]       Lewayne BuntingGregg Cap Massi, MD Follow up on 02/05/2017.   Specialty:  Cardiology Why:  11:15AM Contact information: [address] [place] [address] [phone]           Duration of Discharge Encounter: Greater than 30 minutes including physician time.  Norma FredricksonSigned, Renee Ursuy, PA-C 11/07/2016 10:58 AM  EP Attending  Patient seen and examined. Agree with above.His PPM has been interogated under my direction and is working normally. Will plan to dc home with usual followup.  Leonia ReevesGregg Buford Bremer,M.D.

## 2016-11-07 NOTE — Progress Notes (Signed)
11/07/2016 1250 Discharge AVS meds taken today and those due this evening reviewed.  Follow-up appointments and when to call md reviewed.  D/C IV and TELE.  Questions and concerns addressed.   D/C home per orders. Kathryne HitchAllen, Quayshawn Nin C

## 2016-11-13 ENCOUNTER — Telehealth: Payer: Self-pay | Admitting: Internal Medicine

## 2016-11-13 NOTE — Telephone Encounter (Signed)
New message       I called pt to confirm his thurs wound check appt and he has questions regarding "puffy" vs "swelling" at the pacemaker site.  Please call

## 2016-11-13 NOTE — Telephone Encounter (Signed)
Called pt back about PPM site, pt stated that to him the site was just "puffy" and had minimal bruising compared to the last time. Informed pt that this normal and site would be assessed on Thursday at wound check. Pt voiced understanding.

## 2016-11-13 NOTE — Progress Notes (Signed)
      [address]       [place] [id number]             [phone]    11/06/2016  Asked by Dr Ladona Ridgelaylor to provide dedicated surgical backup for lead extraction in hybrid room 16 on 11/06/2016 I provided one hour of dedicated surgical back up without incident   Delight OvensEdward B Joely Losier MD      [address] [place] [id number] Office [phone]   Beeper [phone]

## 2016-11-15 ENCOUNTER — Ambulatory Visit (INDEPENDENT_AMBULATORY_CARE_PROVIDER_SITE_OTHER): Payer: Federal, State, Local not specified - PPO | Admitting: *Deleted

## 2016-11-15 DIAGNOSIS — I442 Atrioventricular block, complete: Secondary | ICD-10-CM

## 2016-11-15 DIAGNOSIS — Z950 Presence of cardiac pacemaker: Secondary | ICD-10-CM

## 2016-11-15 LAB — CUP PACEART INCLINIC DEVICE CHECK
Brady Statistic AP VP Percent: 9.55 %
Brady Statistic AP VS Percent: 0 %
Brady Statistic RV Percent Paced: 99.98 %
Date Time Interrogation Session: 20171214173304
Implantable Lead Location: 753859
Implantable Lead Model: 5076
Lead Channel Impedance Value: 589 Ohm
Lead Channel Pacing Threshold Amplitude: 0.75 V
Lead Channel Pacing Threshold Pulse Width: 0.4 ms
Lead Channel Sensing Intrinsic Amplitude: 3.625 mV
Lead Channel Setting Pacing Pulse Width: 0.4 ms
Lead Channel Setting Sensing Sensitivity: 4 mV
MDC IDC LEAD IMPLANT DT: 20160715
MDC IDC LEAD IMPLANT DT: 20171205
MDC IDC LEAD LOCATION: 753860
MDC IDC MSMT BATTERY VOLTAGE: 3.13 V
MDC IDC MSMT LEADCHNL RA IMPEDANCE VALUE: 342 Ohm
MDC IDC MSMT LEADCHNL RA IMPEDANCE VALUE: 475 Ohm
MDC IDC MSMT LEADCHNL RA PACING THRESHOLD PULSEWIDTH: 0.4 ms
MDC IDC MSMT LEADCHNL RV IMPEDANCE VALUE: 532 Ohm
MDC IDC MSMT LEADCHNL RV PACING THRESHOLD AMPLITUDE: 0.5 V
MDC IDC PG IMPLANT DT: 20171205
MDC IDC SET LEADCHNL RA PACING AMPLITUDE: 1.5 V
MDC IDC SET LEADCHNL RV PACING AMPLITUDE: 3.5 V
MDC IDC STAT BRADY AS VP PERCENT: 90.44 %
MDC IDC STAT BRADY AS VS PERCENT: 0.01 %
MDC IDC STAT BRADY RA PERCENT PACED: 9.55 %

## 2016-11-15 NOTE — Progress Notes (Signed)
Wound check appointment, s/p RV lead revision. Steri-strips removed. Wound without redness. Mild edema noted over device site, soft and nontender to palpation. Pinhole opening noted along L lateral incision, no stitch noted, antibiotic ointment and bandaid applied. Pt and wife agree to call if stitch is noted, or if pt develops signs/symptoms of infection. Incision edges otherwise approximated, wound well healed. Normal device function. Thresholds, sensing, and impedances consistent with implant measurements. Device programmed at chronic outputs in RA and 3.5V with auto capture on monitor in RV for extra safety margin until 3 month visit. Histogram distribution appropriate for patient and level of activity. No mode switches or high ventricular rates noted. Patient and wife educated about wound care, arm mobility, lifting restrictions, and Carelink home monitor. ROV with GT on 02/05/17.

## 2017-02-05 ENCOUNTER — Ambulatory Visit (INDEPENDENT_AMBULATORY_CARE_PROVIDER_SITE_OTHER): Payer: Federal, State, Local not specified - PPO | Admitting: Internal Medicine

## 2017-02-05 ENCOUNTER — Encounter: Payer: Self-pay | Admitting: Internal Medicine

## 2017-02-05 VITALS — BP 142/82 | HR 79 | Ht 69.0 in | Wt 217.1 lb

## 2017-02-05 DIAGNOSIS — I442 Atrioventricular block, complete: Secondary | ICD-10-CM | POA: Diagnosis not present

## 2017-02-05 DIAGNOSIS — Z950 Presence of cardiac pacemaker: Secondary | ICD-10-CM | POA: Diagnosis not present

## 2017-02-05 LAB — CUP PACEART INCLINIC DEVICE CHECK
Battery Remaining Longevity: 110 mo
Battery Voltage: 3.04 V
Brady Statistic AS VS Percent: 0.01 %
Brady Statistic RA Percent Paced: 5.25 %
Date Time Interrogation Session: 20180306132838
Implantable Lead Implant Date: 20160715
Implantable Lead Location: 753860
Implantable Lead Model: 5076
Implantable Pulse Generator Implant Date: 20171205
Lead Channel Impedance Value: 342 Ohm
Lead Channel Impedance Value: 475 Ohm
Lead Channel Impedance Value: 513 Ohm
Lead Channel Pacing Threshold Amplitude: 0.5 V
Lead Channel Pacing Threshold Pulse Width: 0.4 ms
Lead Channel Sensing Intrinsic Amplitude: 3.5 mV
Lead Channel Sensing Intrinsic Amplitude: 3.875 mV
Lead Channel Setting Pacing Amplitude: 2.5 V
Lead Channel Setting Pacing Pulse Width: 0.4 ms
MDC IDC LEAD IMPLANT DT: 20171205
MDC IDC LEAD LOCATION: 753859
MDC IDC MSMT LEADCHNL RA PACING THRESHOLD AMPLITUDE: 0.75 V
MDC IDC MSMT LEADCHNL RA PACING THRESHOLD PULSEWIDTH: 0.4 ms
MDC IDC MSMT LEADCHNL RV IMPEDANCE VALUE: 627 Ohm
MDC IDC SET LEADCHNL RA PACING AMPLITUDE: 1.5 V
MDC IDC SET LEADCHNL RV SENSING SENSITIVITY: 4 mV
MDC IDC STAT BRADY AP VP PERCENT: 5.26 %
MDC IDC STAT BRADY AP VS PERCENT: 0 %
MDC IDC STAT BRADY AS VP PERCENT: 94.73 %
MDC IDC STAT BRADY RV PERCENT PACED: 99.98 %

## 2017-02-05 NOTE — Progress Notes (Signed)
HPI Mr .Trompeter returns today for ongoing PM followup. The patient has a h/o CHB and underwent PPM insertion but subsequently developed an elevated pacing threshold and underwent extraction of his device and insertion of a new lead and PPM 3 months ago. He has done well in the interim. He admits to being sedentary. No chest pain or sob. No edema.  Allergies  Allergen Reactions  . No Known Allergies      Current Outpatient Prescriptions  Medication Sig Dispense Refill  . amphetamine-dextroamphetamine (ADDERALL XR) 25 MG 24 hr capsule Take 25 mg by mouth every morning.    Marland Kitchen aspirin-acetaminophen-caffeine (EXCEDRIN MIGRAINE) 250-250-65 MG per tablet Take 2 tablets by mouth every 6 (six) hours as needed for headache.    . fluticasone (FLONASE) 50 MCG/ACT nasal spray Place 1-2 sprays into both nostrils daily as needed for allergies or rhinitis.    Marland Kitchen glucose blood (ONE TOUCH ULTRA TEST) test strip Once daily dx 250.00 100 each 11  . HYDROcodone-acetaminophen (NORCO) 7.5-325 MG tablet Take 1 tablet by mouth daily as needed (pain).     Marland Kitchen ibuprofen (ADVIL,MOTRIN) 200 MG tablet Take 600 mg by mouth every 8 (eight) hours as needed for moderate pain.     Marland Kitchen lisinopril (PRINIVIL,ZESTRIL) 20 MG tablet Take 20 mg by mouth daily.    . metFORMIN (GLUCOPHAGE-XR) 500 MG 24 hr tablet Take 1,000 mg by mouth every morning.     . Multiple Vitamins-Minerals (ZINC PO) Take 1 tablet by mouth daily.    [name] DELICA LANCETS MISC Once daily dx 250.00 Once daily dx 250.00 100 each 11  . sildenafil (VIAGRA) 100 MG tablet Take 100 mg by mouth daily as needed for erectile dysfunction.    . simvastatin (ZOCOR) 40 MG tablet Take 1 tablet (40 mg total) by mouth at bedtime. 90 tablet 3  . tamsulosin (FLOMAX) 0.4 MG CAPS capsule Take 0.4 mg by mouth daily.     No current facility-administered medications for this visit.      Past Medical History:  Diagnosis Date  . ADHD (attention deficit hyperactivity  disorder)   . Adjustment disorder with anxiety   . Allergic rhinitis   . Anxiety   . Complete heart block (HCC)    a. s/p Medtronic PPM 06/17/15 (device dependent).  . Depression   . Diabetes mellitus (HCC)   . Essential hypertension   . Fracture of right clavicle 11/09/2009   Chronic right collarbone pain-non union  . Headache   . Heart murmur    at one time, not significant per pt  . Hyperlipidemia   . Hypokalemia   . NSAID long-term use   . Presence of permanent cardiac pacemaker   . RECTAL BLEEDING 01/22/2008    ROS:   All systems reviewed and negative except as noted in the HPI.   Past Surgical History:  Procedure Laterality Date  . COLONOSCOPY    . EP IMPLANTABLE DEVICE N/A 06/17/2015   Procedure: Pacemaker Implant;  Surgeon: Duke Salvia, MD;  Location: [facility] INVASIVE CV LAB;  Service: Cardiovascular;  Laterality: N/A;  . EP IMPLANTABLE DEVICE Right 11/06/2016   Procedure: INSERTION of new right ventricle pacemaker lead  and insertion of new pacemaker;  Surgeon: Marinus Maw, MD;  [facility];  Service: Cardiovascular;  Laterality: Right;  . ORIF CLAVICLE FRACTURE  2/08   s/p collarbone fx with Fall-Murphy Wainer  . PACEMAKER LEAD REMOVAL Left 11/06/2016   Procedure: PACEMAKER LEAD EXTRACTION;  Surgeon: Marinus MawGregg W Delylah Stanczyk, MD;  [facility];  Service: Cardiovascular;  Laterality: Left;  DR. Tyrone SageGERHARDT TO BACKUP CASE  . TEE WITHOUT CARDIOVERSION N/A 11/06/2016   Procedure: TRANSESOPHAGEAL ECHOCARDIOGRAM (TEE);  Surgeon: Marinus MawGregg W Abaigeal Moomaw, MD;  [facility];  Service: Cardiovascular;  Laterality: N/A;  . TONSILLECTOMY       Family History  Problem Relation Age of Onset  . Heart failure Father   . Diabetes Daughter   . Heart attack Neg Hx   . Stroke Neg Hx      Social History   Social History  . Marital status: Single    Spouse name: N/A  . Number of children: N/A  . Years of education: N/A   Occupational History  . 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 Smoking status: Former Games developermoker  . Smokeless tobacco: Never Used  . Alcohol use No  . Drug use: No  . Sexual activity: Not on file   Other Topics Concern  . Not on file   Social History Narrative  . No narrative on file     BP (!) 142/82   Pulse 79   Ht 5\' 9"  (1.753 m)   Wt 217 lb 2 oz (98.5 kg)   SpO2 96%   BMI 32.06 kg/m   Physical Exam:  Well appearing 71 yo man, NAD HEENT: Unremarkable Neck:  6 cm JVD, no thyromegally Lymphatics:  No adenopathy Back:  No CVA tenderness Lungs:  Clear with no wheezes HEART:  Regular rate rhythm, no murmurs, no rubs, no clicks Abd:  soft, positive bowel sounds, no organomegally, no rebound, no guarding Ext:  2 plus pulses, no edema, no cyanosis, no clubbing Skin:  No rashes no nodules Neuro:  CN II through XII intact, motor grossly intact  EKG - NSR with ventricular pacing  DEVICE  Normal device function.  See PaceArt for details.   Assess/Plan: 1. cHB - he is doing well, s/p PPM insertion 2. PPM - his medtronic DDD PM is working normally. Will recheck in several months. 3. HTN - his blood pressure is up a bit but better at home. I have asked him to increase his activity, lose weight, and reduce his salt intake.  Leonia ReevesGregg Amariah Kierstead,M.D.

## 2017-02-05 NOTE — Patient Instructions (Addendum)
Medication Instructions:  Your physician recommends that you continue on your current medications as directed. Please refer to the Current Medication list given to you today.   Labwork: None Ordered   Testing/Procedures: None Ordered   Follow-Up: Your physician wants you to follow-up in: 9 months with Dr. Ladona Ridgelaylor. You will receive a reminder letter in the mail two months in advance. If you don't receive a letter, please call our office to schedule the follow-up appointment.  Remote monitoring is used to monitor your Pacemaker from home. This monitoring reduces the number of office visits required to check your device to one time per year. It allows us to keep an eye on the functioning of your device to ensure it is working properly. You are scheduled for a device check from home on 05/07/17. You may send your transmission at any time that day. If you have a wireless device, the transmission will be sent automatically. After your physician reviews your transmission, you will receive a postcard with your next transmission date.    Any Other Special Instructions Will Be Listed Below (If Applicable).     If you need a refill on your cardiac medications before your next appointment, please call your pharmacy.

## 2017-05-07 ENCOUNTER — Ambulatory Visit (INDEPENDENT_AMBULATORY_CARE_PROVIDER_SITE_OTHER): Payer: Federal, State, Local not specified - PPO | Admitting: *Deleted

## 2017-05-07 ENCOUNTER — Telehealth: Payer: Self-pay | Admitting: Cardiology

## 2017-05-07 DIAGNOSIS — I442 Atrioventricular block, complete: Secondary | ICD-10-CM

## 2017-05-07 NOTE — Telephone Encounter (Signed)
LMOVM reminding pt to send remote transmission.   

## 2017-05-08 LAB — CUP PACEART REMOTE DEVICE CHECK
Battery Voltage: 3.02 V
Brady Statistic AP VS Percent: 0 %
Brady Statistic AS VP Percent: 90.62 %
Brady Statistic RV Percent Paced: 99.98 %
Date Time Interrogation Session: 20180605202210
Implantable Lead Implant Date: 20171205
Implantable Lead Location: 753860
Implantable Lead Model: 5076
Lead Channel Impedance Value: 513 Ohm
Lead Channel Impedance Value: 589 Ohm
Lead Channel Pacing Threshold Amplitude: 0.875 V
Lead Channel Sensing Intrinsic Amplitude: 15 mV
Lead Channel Sensing Intrinsic Amplitude: 15 mV
Lead Channel Setting Pacing Amplitude: 2 V
Lead Channel Setting Pacing Amplitude: 2.5 V
Lead Channel Setting Pacing Pulse Width: 0.4 ms
Lead Channel Setting Sensing Sensitivity: 4 mV
MDC IDC LEAD IMPLANT DT: 20160715
MDC IDC LEAD LOCATION: 753859
MDC IDC MSMT BATTERY REMAINING LONGEVITY: 106 mo
MDC IDC MSMT LEADCHNL RA IMPEDANCE VALUE: 323 Ohm
MDC IDC MSMT LEADCHNL RA IMPEDANCE VALUE: 456 Ohm
MDC IDC MSMT LEADCHNL RA PACING THRESHOLD PULSEWIDTH: 0.4 ms
MDC IDC MSMT LEADCHNL RA SENSING INTR AMPL: 3.375 mV
MDC IDC MSMT LEADCHNL RA SENSING INTR AMPL: 3.375 mV
MDC IDC MSMT LEADCHNL RV PACING THRESHOLD AMPLITUDE: 0.375 V
MDC IDC MSMT LEADCHNL RV PACING THRESHOLD PULSEWIDTH: 0.4 ms
MDC IDC PG IMPLANT DT: 20171205
MDC IDC STAT BRADY AP VP PERCENT: 9.37 %
MDC IDC STAT BRADY AS VS PERCENT: 0.01 %
MDC IDC STAT BRADY RA PERCENT PACED: 9.34 %

## 2017-05-08 NOTE — Progress Notes (Signed)
Remote pacemaker transmission.   

## 2017-05-14 ENCOUNTER — Encounter: Payer: Self-pay | Admitting: Cardiology

## 2017-05-30 ENCOUNTER — Encounter: Payer: Self-pay | Admitting: Cardiology

## 2017-08-06 ENCOUNTER — Ambulatory Visit (INDEPENDENT_AMBULATORY_CARE_PROVIDER_SITE_OTHER): Payer: Federal, State, Local not specified - PPO | Admitting: *Deleted

## 2017-08-06 DIAGNOSIS — I442 Atrioventricular block, complete: Secondary | ICD-10-CM | POA: Diagnosis not present

## 2017-08-07 NOTE — Progress Notes (Signed)
Remote pacemaker transmission.   

## 2017-08-14 ENCOUNTER — Encounter: Payer: Self-pay | Admitting: Cardiology

## 2017-08-15 LAB — CUP PACEART REMOTE DEVICE CHECK
Battery Remaining Longevity: 102 mo
Battery Voltage: 3.02 V
Brady Statistic AP VS Percent: 0 %
Brady Statistic AS VS Percent: 0.01 %
Brady Statistic RA Percent Paced: 6.39 %
Date Time Interrogation Session: 20180905131957
Implantable Lead Implant Date: 20160715
Implantable Lead Location: 753859
Implantable Lead Location: 753860
Implantable Pulse Generator Implant Date: 20171205
Lead Channel Impedance Value: 475 Ohm
Lead Channel Pacing Threshold Amplitude: 0.375 V
Lead Channel Pacing Threshold Amplitude: 0.75 V
Lead Channel Sensing Intrinsic Amplitude: 15.875 mV
Lead Channel Sensing Intrinsic Amplitude: 3.125 mV
Lead Channel Sensing Intrinsic Amplitude: 3.125 mV
Lead Channel Setting Pacing Amplitude: 2.5 V
MDC IDC LEAD IMPLANT DT: 20171205
MDC IDC MSMT LEADCHNL RA IMPEDANCE VALUE: 342 Ohm
MDC IDC MSMT LEADCHNL RA PACING THRESHOLD PULSEWIDTH: 0.4 ms
MDC IDC MSMT LEADCHNL RV IMPEDANCE VALUE: 551 Ohm
MDC IDC MSMT LEADCHNL RV IMPEDANCE VALUE: 608 Ohm
MDC IDC MSMT LEADCHNL RV PACING THRESHOLD PULSEWIDTH: 0.4 ms
MDC IDC MSMT LEADCHNL RV SENSING INTR AMPL: 15.875 mV
MDC IDC SET LEADCHNL RA PACING AMPLITUDE: 1.75 V
MDC IDC SET LEADCHNL RV PACING PULSEWIDTH: 0.4 ms
MDC IDC SET LEADCHNL RV SENSING SENSITIVITY: 4 mV
MDC IDC STAT BRADY AP VP PERCENT: 6.39 %
MDC IDC STAT BRADY AS VP PERCENT: 93.6 %
MDC IDC STAT BRADY RV PERCENT PACED: 99.98 %

## 2017-08-19 IMAGING — CR DG CHEST 2V
2 series · 2 of 2 positions shown · non-contrast
Comparison: 11/06/2016

CLINICAL DATA: Pacemaker lead failure.

EXAM:
CHEST  2 VIEW

[chest pa]
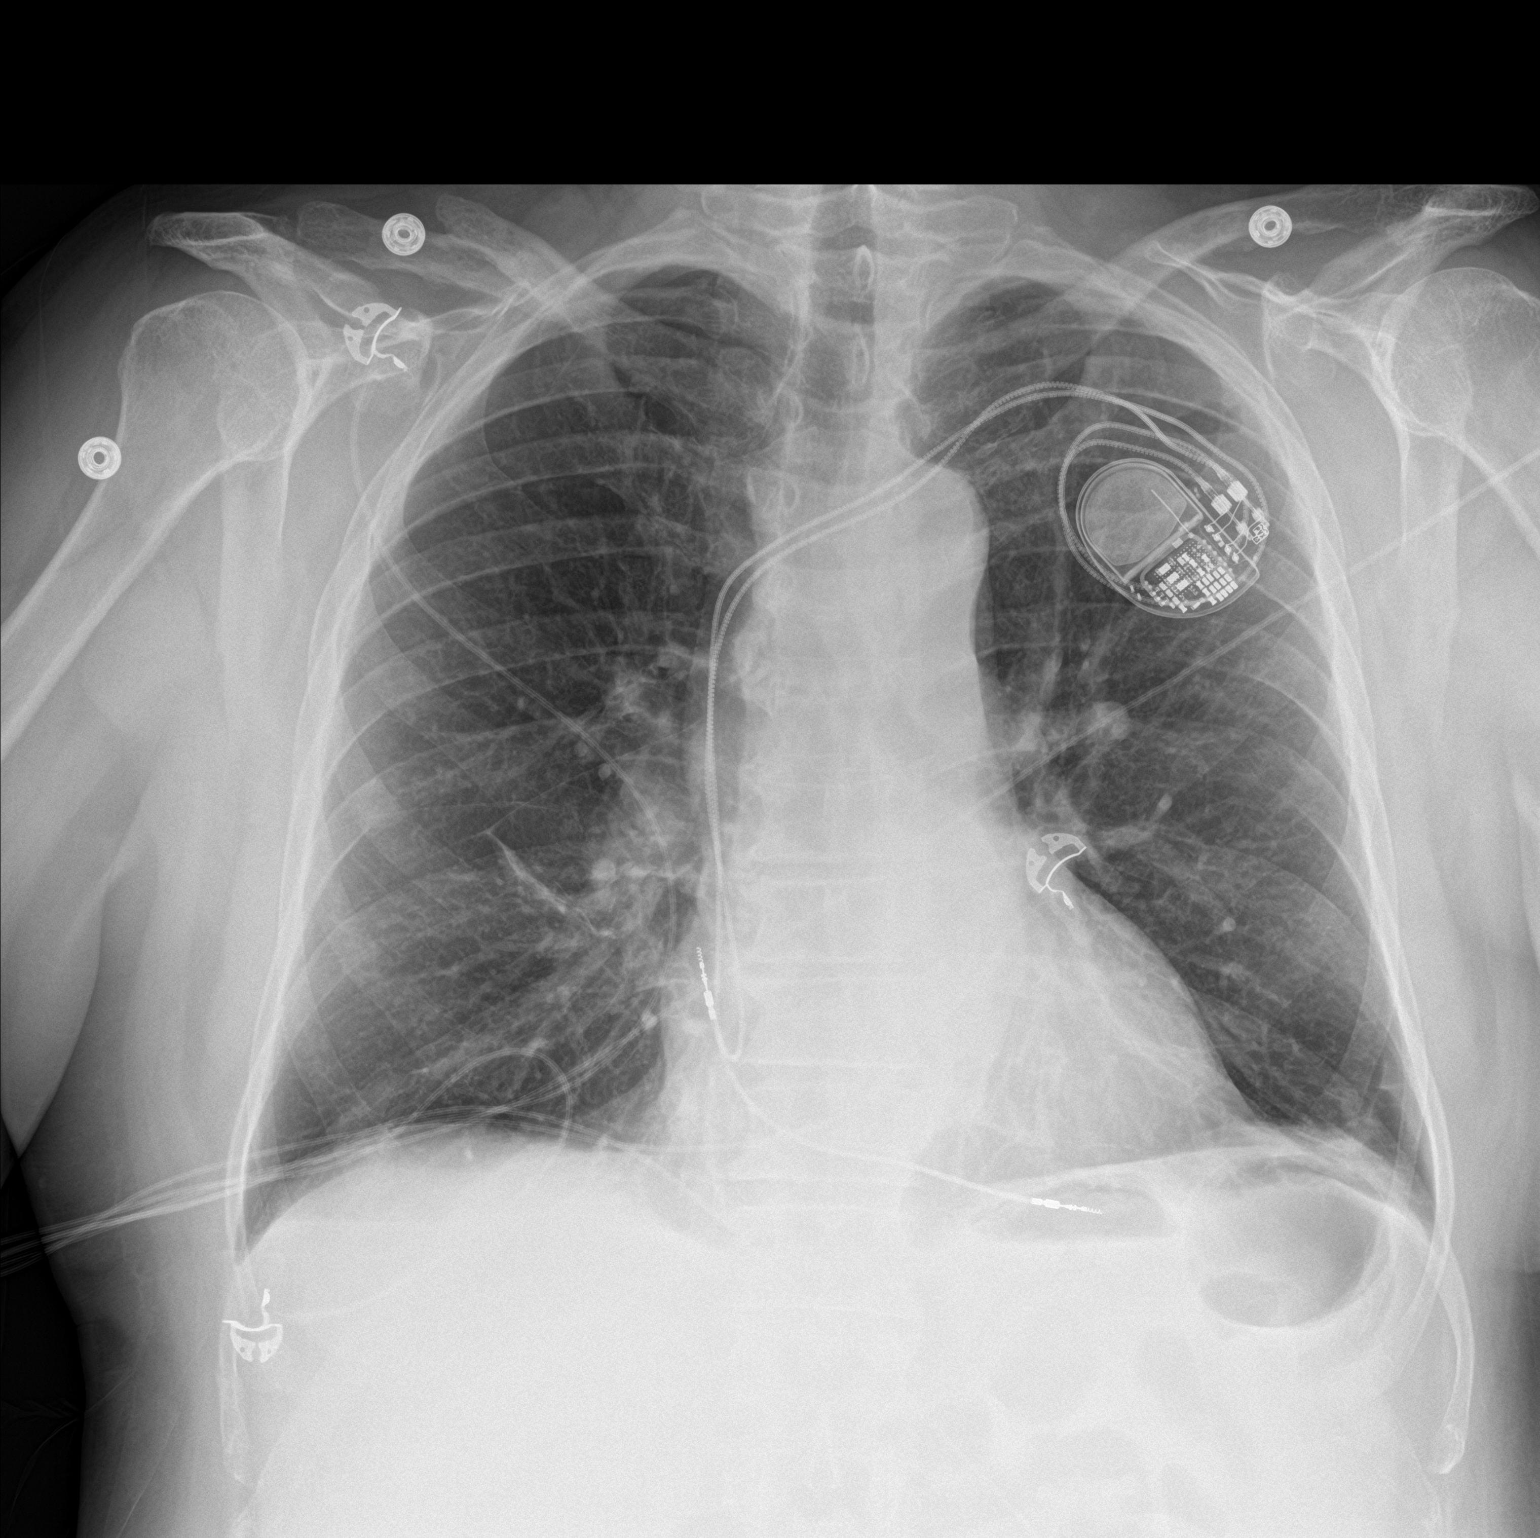

[chest lat]
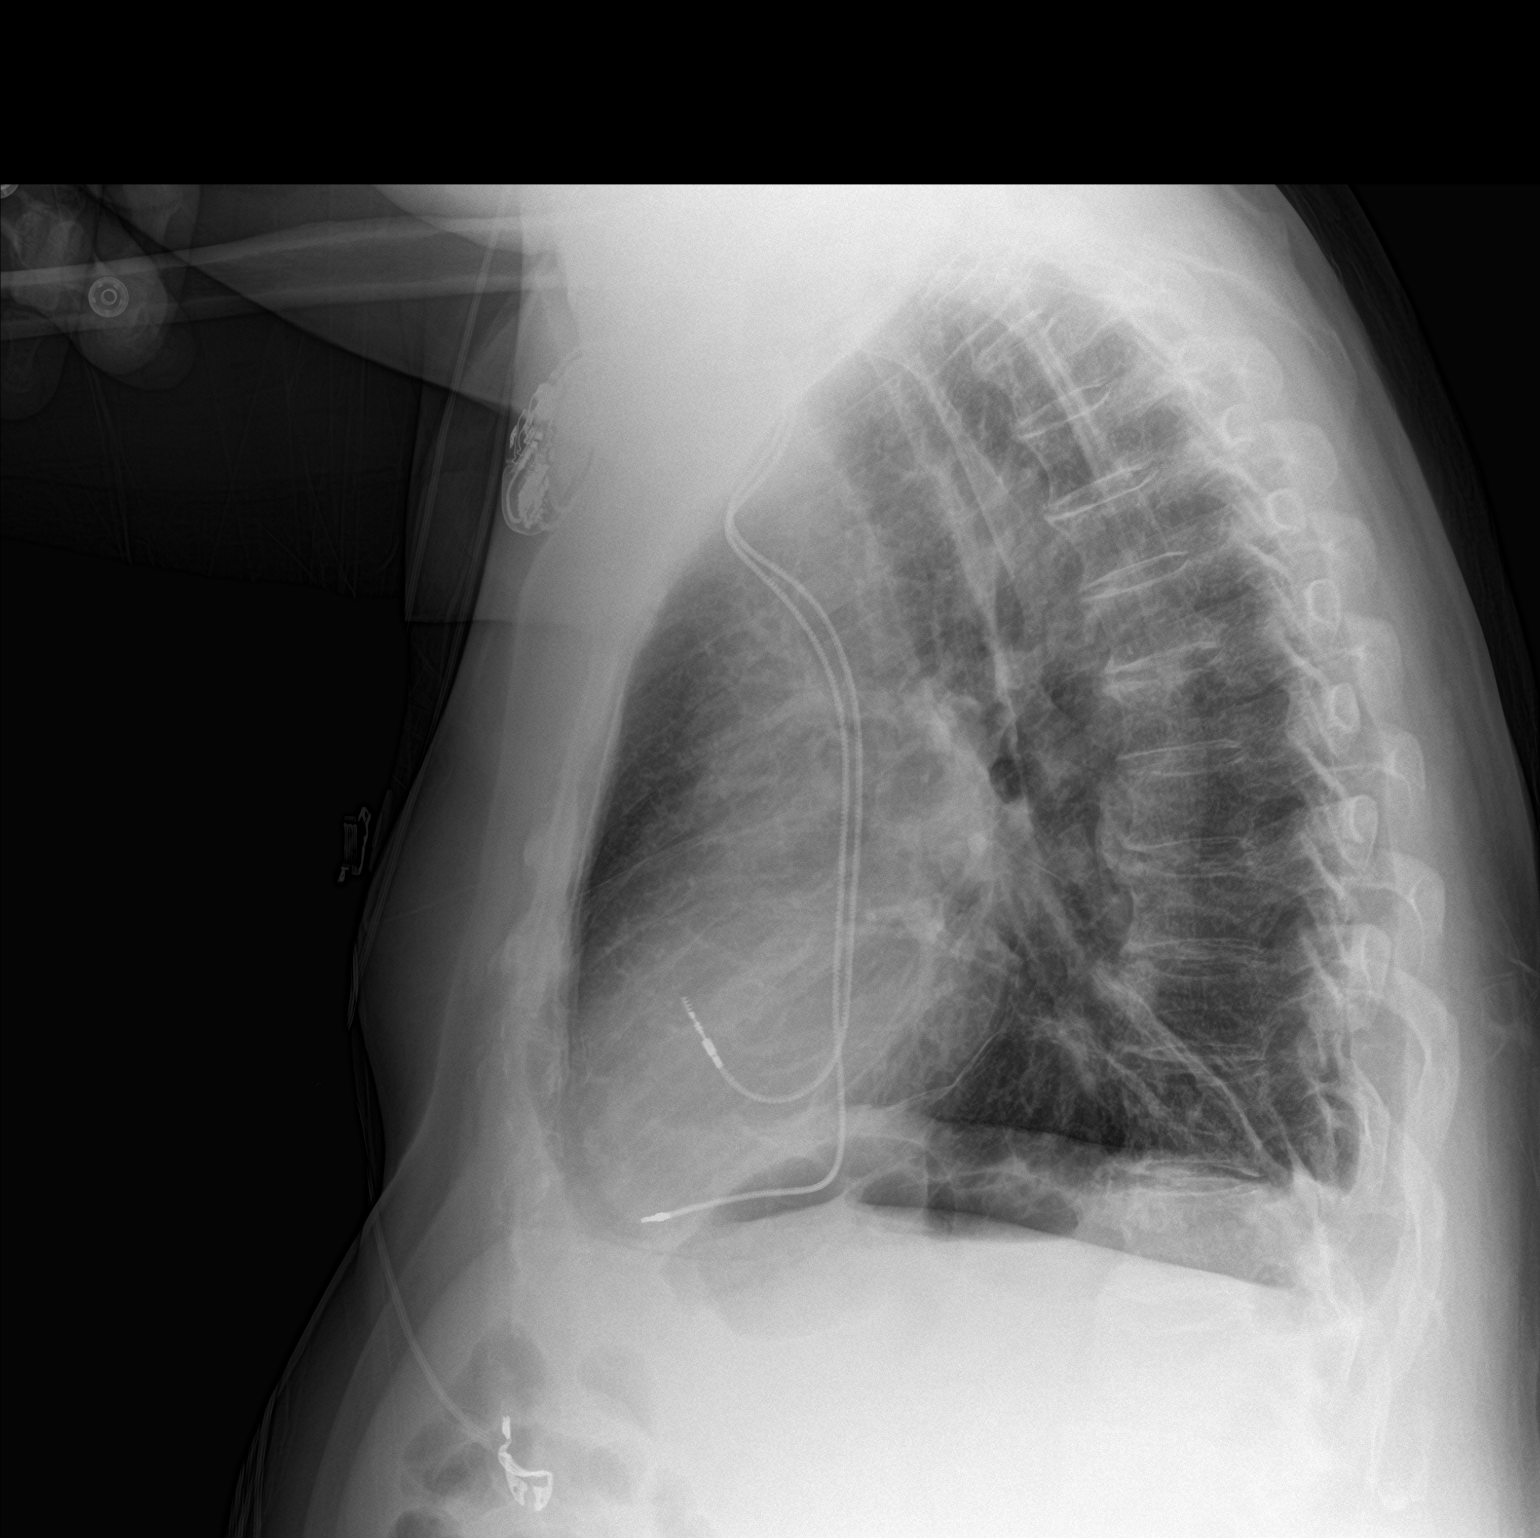

[2 of 2 positions shown; findings below may reference images not displayed]

FINDINGS: Right jugular central venous catheter has been removed. A left
subclavian approach pacemaker remains in place and is unchanged in
appearance. Leads terminate over the right atrium and right
ventricle and appear intact radiographically. Minimal left basilar
opacity is present. The right lung is clear. There is minimal
blunting of the posterior costophrenic angles which may be secondary
to mild hyperinflation and pleural thickening, although trace
effusions are not excluded. No pneumothorax. Old right clavicle
fracture again noted.
IMPRESSION: 1. Unchanged and unremarkable appearance of dual lead pacemaker.
2. Minimal left basilar atelectasis.

## 2017-11-05 ENCOUNTER — Telehealth: Payer: Self-pay | Admitting: Cardiology

## 2017-11-05 ENCOUNTER — Encounter: Payer: Federal, State, Local not specified - PPO | Admitting: *Deleted

## 2017-11-05 NOTE — Telephone Encounter (Signed)
LMOVM reminding pt to send remote transmission.   

## 2017-11-07 ENCOUNTER — Encounter: Payer: Self-pay | Admitting: Cardiology

## 2017-12-02 NOTE — Progress Notes (Signed)
Cardiology Office Note Date:  12/04/2017  Patient ID:  Jeremy Abbott, DOB 11/29/1946, MRN [id number] PCP:  Jeremy Elseeade, Robert, MD  Electrophysiologist:  Dr. Eliot FordZKlein   Chief Complaint: planned f/u  History of Present Illness: Jeremy Abbott is a 71 y.o. male with history of CHB w/ PPM, HTN, HLD, DM, COPD  He comes in today to be seen for Dr. Graciela HusbandsKlein.  He last saw EP with Dr. Ladona Ridgelaylor, post procedure in March, at that time doing well, BP was up some and recommended to exercise and reduce sodium intake.  He has not had any near syncope or syncope, no palpitations, CP or SOB.  He will note some LE/ankle swelling that resolves overnight.  He walks about every other day weather/outdoor temperature permitting, always more sedentary in the winter months.  He follows his labs/lipids with his PMD.  Device information MDT dual chamber PPM implanted 06/17/15, s/p RV lead extraction 2/2 high thresholds with new RV lead and generator 11/06/16 PACER DEPENDENT today  Past Medical History:  Diagnosis Date  . ADHD (attention deficit hyperactivity disorder)   . Adjustment disorder with anxiety   . Allergic rhinitis   . Anxiety   . Complete heart block (HCC)    a. s/p Medtronic PPM 06/17/15 (device dependent).  . Depression   . Diabetes mellitus (HCC)   . Essential hypertension   . Fracture of right clavicle 11/09/2009   Chronic right collarbone pain-non union  . Headache   . Heart murmur    at one time, not significant per pt  . Hyperlipidemia   . Hypokalemia   . NSAID long-term use   . Presence of permanent cardiac pacemaker   . RECTAL BLEEDING 01/22/2008    Past Surgical History:  Procedure Laterality Date  . COLONOSCOPY    . EP IMPLANTABLE DEVICE N/A 06/17/2015   Procedure: Pacemaker Implant;  Surgeon: Jeremy SalviaSteven C Klein, MD;  [facility];  Service: Cardiovascular;  Laterality: N/A;  . EP IMPLANTABLE DEVICE Right 11/06/2016   Procedure: INSERTION of new right ventricle pacemaker lead   and insertion of new pacemaker;  Surgeon: Jeremy MawGregg W Taylor, MD;  [facility];  Service: Cardiovascular;  Laterality: Right;  . ORIF CLAVICLE FRACTURE  2/08   s/p collarbone fx with Fall-Jeremy Abbott  . PACEMAKER LEAD REMOVAL Left 11/06/2016   Procedure: PACEMAKER LEAD EXTRACTION;  Surgeon: Jeremy MawGregg W Taylor, MD;  [facility];  Service: Cardiovascular;  Laterality: Left;  DR. Tyrone SageGERHARDT TO BACKUP CASE  . TEE WITHOUT CARDIOVERSION N/A 11/06/2016   Procedure: TRANSESOPHAGEAL ECHOCARDIOGRAM (TEE);  Surgeon: Jeremy MawGregg W Taylor, MD;  [facility];  Service: Cardiovascular;  Laterality: N/A;  . TONSILLECTOMY      Current Outpatient Medications  Medication Sig Dispense Refill  . amphetamine-dextroamphetamine (ADDERALL XR) 25 MG 24 hr capsule Take 25 mg by mouth every morning.    Marland Kitchen. aspirin-acetaminophen-caffeine (EXCEDRIN MIGRAINE) 250-250-65 MG per tablet Take 2 tablets by mouth every 6 (six) hours as needed for headache.    . fluticasone (FLONASE) 50 MCG/ACT nasal spray Place 1-2 sprays into both nostrils daily as needed for allergies or rhinitis.    Marland Kitchen. glucose blood (ONE TOUCH ULTRA TEST) test strip Once daily dx 250.00 100 each 11  . HYDROcodone-acetaminophen (NORCO) 7.5-325 MG tablet Take 1 tablet by mouth daily as needed (pain).     Marland Kitchen. ibuprofen (ADVIL,MOTRIN) 200 MG tablet Take 600 mg by mouth every 8 (eight) hours as needed for moderate pain.     .Marland Kitchen  lisinopril (PRINIVIL,ZESTRIL) 20 MG tablet Take 20 mg by mouth daily.    . metFORMIN (GLUCOPHAGE-XR) 500 MG 24 hr tablet Take 1,000 mg by mouth every morning.     . Multiple Vitamins-Minerals (ZINC PO) Take 1 tablet by mouth daily.    [name] DELICA LANCETS MISC Once daily dx 250.00 Once daily dx 250.00 100 each 11  . sildenafil (VIAGRA) 100 MG tablet Take 100 mg by mouth daily as needed for erectile dysfunction.    . simvastatin (ZOCOR) 40 MG tablet Take 1 tablet (40 mg total) by mouth at bedtime. 90 tablet 3  . tamsulosin (FLOMAX) 0.4 MG CAPS  capsule Take 0.4 mg by mouth daily.     No current facility-administered medications for this visit.     Allergies:   No known allergies   Social History:  The patient  reports that he has quit smoking. he has never used smokeless tobacco. He reports that he does not drink alcohol or use drugs.   Family History:  The patient's family history includes Diabetes in his daughter; Heart failure in his father.  ROS:  Please see the history of present illness.    All other systems are reviewed and otherwise negative.   PHYSICAL EXAM:  VS:  BP (!) 142/80   Pulse 93 Comment: is normally between 78-82 at home.  Ht 5' 9.5" (1.765 m)   Wt 224 lb 6.4 oz (101.8 kg)   SpO2 97%   BMI 32.66 kg/m  BMI: Body mass index is 32.66 kg/m. Well nourished, well developed, in no acute distress  HEENT: normocephalic, atraumatic  Neck: no JVD, carotid bruits or masses Cardiac:  RRR; no significant murmurs, no rubs, or gallops Lungs:  CTA b/l, no wheezing, rhonchi or rales  Abd: soft, nontender MS: no deformity, age appropriate atrophy Ext: trace edema b/l Skin: warm and dry, no rash Neuro:  No gross deficits appreciated Psych: euthymic mood, full affect  PPM site is stable, no tethering or discomfort   EKG:  Not done today PPM interrogation done today and reviewed by myself: battery and lead testing are good.  10 AT/AF episodes are ATach, longest 3:34min, 4 NSVT episodes, in September one wavered right about 150bpm, approx 20 seconds, he pacer dependent at 30bpm  11/06/17: inter-op TEE Study Conclusions - Left ventricle: Systolic function was normal. The estimated   ejection fraction was in the range of 50% to 55%. - Aortic valve: No evidence of vegetation. - Mitral valve: No evidence of vegetation. There was mild   regurgitation. - Left atrium: No evidence of thrombus in the atrial cavity or   appendage. No evidence of thrombus in the appendage. - Right atrium: No evidence of thrombus in the  atrial cavity or   appendage. - Atrial septum: There was increased thickness of the septum,   consistent with lipomatous hypertrophy. No defect or patent   foramen ovale was identified. - Tricuspid valve: No evidence of vegetation. - Line: A venous pacing wire was visualized in the right atrial   cavity and right ventricular cavity, with its tip at the RV apex.  Recent Labs: No results found for requested labs within last 8760 hours.  No results found for requested labs within last 8760 hours.   CrCl cannot be calculated (Patient's most recent lab result is older than the maximum 21 days allowed.).   Wt Readings from Last 3 Encounters:  12/04/17 224 lb 6.4 oz (101.8 kg)  02/05/17 217 lb 2 oz (98.5 kg)  11/06/16 232 lb 1.6 oz (105.3 kg)     Other studies reviewed: Additional studies/records reviewed today include: summarized above  ASSESSMENT AND PLAN:  1. PPM     Intact function  2. HTN     Looks OK, no changes  3. HLD     Monitored and managed with his PMD     Discussed his weight gain, diet/exercise  4. NSVT     Noted also at last remote     Asymptomatic     D/w Dr. Graciela HusbandsKlein, will recheck his echo, plan stress myoview, and get BMET, mag today  4. ATach     This has been observed historically     Very breif, no AFib or flutter   Disposition: Will get an echo and exercise stress myoview to evaluate his VT episode, continue with every 3 month remote checks and pending his echo and stress results, an in-clinic with Dr. Graciela HusbandsKlein in 1 year, sooner if needed.   Current medicines are reviewed at length with the patient today.  The patient did not have any concerns regarding medicines.  Norma FredricksonSigned, Rosamae Rocque, PA-C 12/04/2017 2:17 PM     [facility] [address] [place] [address] [phone] (office)  [phone] (fax)

## 2017-12-04 ENCOUNTER — Encounter: Payer: Self-pay | Admitting: Physician Assistant

## 2017-12-04 ENCOUNTER — Ambulatory Visit: Payer: Federal, State, Local not specified - PPO | Admitting: Physician Assistant

## 2017-12-04 VITALS — BP 142/80 | HR 93 | Ht 69.5 in | Wt 224.4 lb

## 2017-12-04 DIAGNOSIS — I471 Supraventricular tachycardia: Secondary | ICD-10-CM

## 2017-12-04 DIAGNOSIS — I472 Ventricular tachycardia, unspecified: Secondary | ICD-10-CM

## 2017-12-04 DIAGNOSIS — Z950 Presence of cardiac pacemaker: Secondary | ICD-10-CM | POA: Diagnosis not present

## 2017-12-04 DIAGNOSIS — I10 Essential (primary) hypertension: Secondary | ICD-10-CM | POA: Diagnosis not present

## 2017-12-04 DIAGNOSIS — E7849 Other hyperlipidemia: Secondary | ICD-10-CM

## 2017-12-04 NOTE — Patient Instructions (Addendum)
Medication Instructions:   Your physician recommends that you continue on your current medications as directed. Please refer to the Current Medication list given to you today.   If you need a refill on your cardiac medications before your next appointment, please call your pharmacy.  Labwork:   BMET  AND MAG    Testing/Procedures:   Your physician has requested that you have an echocardiogram. Echocardiography is a painless test that uses sound waves to create images of your heart. It provides your doctor with information about the size and shape of your heart and how well your heart's chambers and valves are working. This procedure takes approximately one hour. There are no restrictions for this procedure.  Your physician has requested that you have en exercise stress myoview. For further information please visit [email or web address]. Please follow instruction sheet, as given.    Follow-Up:  Your physician wants you to follow-up in: ONE YEAR WITH  Ladona RidgelAYLOR  You will receive a reminder letter in the mail two months in advance. If you don't receive a letter, please call our office to schedule the follow-up appointment.  Remote monitoring is used to monitor your Pacemaker of ICD from home. This monitoring reduces the number of office visits required to check your device to one time per year. It allows us to keep an eye on the functioning of your device to ensure it is working properly. You are scheduled for a device check from home on . 03-05-2018  You may send your transmission at any time that day. If you have a wireless device, the transmission will be sent automatically. After your physician reviews your transmission, you will receive a postcard with your next transmission date.     Any Other Special Instructions Will Be Listed Below (If Applicable).

## 2017-12-05 LAB — BASIC METABOLIC PANEL
BUN/Creatinine Ratio: 17 (ref 10–24)
BUN: 13 mg/dL (ref 8–27)
CALCIUM: 9.5 mg/dL (ref 8.6–10.2)
CO2: 23 mmol/L (ref 20–29)
Chloride: 104 mmol/L (ref 96–106)
Creatinine, Ser: 0.77 mg/dL (ref 0.76–1.27)
GFR calc Af Amer: 106 mL/min/{1.73_m2} (ref >59)
GFR calc non Af Amer: 91 mL/min/{1.73_m2} (ref >59)
GLUCOSE: 96 mg/dL (ref 65–99)
POTASSIUM: 4.6 mmol/L (ref 3.5–5.2)
SODIUM: 144 mmol/L (ref 134–144)

## 2017-12-05 LAB — MAGNESIUM: MAGNESIUM: 2.1 mg/dL (ref 1.6–2.3)

## 2017-12-09 ENCOUNTER — Telehealth (HOSPITAL_COMMUNITY): Payer: Self-pay | Admitting: *Deleted

## 2017-12-09 NOTE — Telephone Encounter (Signed)
Patient given detailed instructions per Myocardial Perfusion Study Information Sheet for the test on 11/11/18. Patient notified to arrive 15 minutes early and that it is imperative to arrive on time for appointment to keep from having the test rescheduled.  If you need to cancel or reschedule your appointment, please call the office within 24 hours of your appointment. . Patient verbalized understanding.Jeremy Abbott Jacqueline    

## 2017-12-12 ENCOUNTER — Encounter (HOSPITAL_COMMUNITY): Payer: Federal, State, Local not specified - PPO

## 2017-12-12 ENCOUNTER — Other Ambulatory Visit: Payer: Self-pay

## 2017-12-12 ENCOUNTER — Ambulatory Visit (HOSPITAL_COMMUNITY): Payer: Federal, State, Local not specified - PPO | Attending: Cardiology

## 2017-12-12 ENCOUNTER — Ambulatory Visit (HOSPITAL_BASED_OUTPATIENT_CLINIC_OR_DEPARTMENT_OTHER): Payer: Federal, State, Local not specified - PPO

## 2017-12-12 DIAGNOSIS — I472 Ventricular tachycardia, unspecified: Secondary | ICD-10-CM

## 2017-12-12 DIAGNOSIS — I510 Cardiac septal defect, acquired: Secondary | ICD-10-CM | POA: Diagnosis not present

## 2017-12-12 DIAGNOSIS — I5030 Unspecified diastolic (congestive) heart failure: Secondary | ICD-10-CM | POA: Diagnosis not present

## 2017-12-12 DIAGNOSIS — I420 Dilated cardiomyopathy: Secondary | ICD-10-CM | POA: Diagnosis not present

## 2017-12-12 LAB — ECHOCARDIOGRAM COMPLETE
HEIGHTINCHES: 69 in
Weight: 3584 oz

## 2017-12-12 LAB — MYOCARDIAL PERFUSION IMAGING
CHL CUP RESTING HR STRESS: 73 {beats}/min
LHR: 0.33
LVDIAVOL: 120 mL (ref 62–150)
LVSYSVOL: 63 mL
NUC STRESS TID: 0.98
Peak HR: 131 {beats}/min
SDS: 4
SRS: 8
SSS: 12

## 2017-12-12 MED ORDER — TECHNETIUM TC 99M TETROFOSMIN IV KIT
10.5000 | PACK | Freq: Once | INTRAVENOUS | Status: AC | PRN
  Administered 2017-12-12: 10.5 via INTRAVENOUS
  Filled 2017-12-12: qty 11

## 2017-12-12 MED ORDER — REGADENOSON 0.4 MG/5ML IV SOLN
0.4000 mg | Freq: Once | INTRAVENOUS | Status: AC
  Administered 2017-12-12: 0.4 mg via INTRAVENOUS

## 2017-12-12 MED ORDER — TECHNETIUM TC 99M TETROFOSMIN IV KIT
32.8000 | PACK | Freq: Once | INTRAVENOUS | Status: AC | PRN
  Administered 2017-12-12: 32.8 via INTRAVENOUS
  Filled 2017-12-12: qty 33

## 2017-12-19 ENCOUNTER — Encounter: Payer: Self-pay | Admitting: *Deleted

## 2018-03-05 ENCOUNTER — Ambulatory Visit (INDEPENDENT_AMBULATORY_CARE_PROVIDER_SITE_OTHER): Payer: Federal, State, Local not specified - PPO | Admitting: *Deleted

## 2018-03-05 DIAGNOSIS — I442 Atrioventricular block, complete: Secondary | ICD-10-CM

## 2018-03-05 NOTE — Progress Notes (Signed)
Remote pacemaker transmission.   

## 2018-03-06 ENCOUNTER — Encounter: Payer: Self-pay | Admitting: Cardiology

## 2018-03-11 LAB — CUP PACEART REMOTE DEVICE CHECK
Battery Voltage: 3.01 V
Brady Statistic AP VP Percent: 16.83 %
Brady Statistic AS VP Percent: 83.14 %
Brady Statistic RA Percent Paced: 16.82 %
Date Time Interrogation Session: 20190403152117
Implantable Lead Implant Date: 20171205
Implantable Lead Location: 753859
Implantable Lead Model: 5076
Implantable Lead Model: 5076
Implantable Pulse Generator Implant Date: 20171205
Lead Channel Impedance Value: 323 Ohm
Lead Channel Impedance Value: 494 Ohm
Lead Channel Impedance Value: 551 Ohm
Lead Channel Pacing Threshold Amplitude: 1 V
Lead Channel Pacing Threshold Pulse Width: 0.4 ms
Lead Channel Pacing Threshold Pulse Width: 0.4 ms
Lead Channel Sensing Intrinsic Amplitude: 14.5 mV
Lead Channel Sensing Intrinsic Amplitude: 14.5 mV
Lead Channel Setting Pacing Amplitude: 2 V
Lead Channel Setting Pacing Amplitude: 2.5 V
Lead Channel Setting Pacing Pulse Width: 0.4 ms
Lead Channel Setting Sensing Sensitivity: 4 mV
MDC IDC LEAD IMPLANT DT: 20160715
MDC IDC LEAD LOCATION: 753860
MDC IDC MSMT BATTERY REMAINING LONGEVITY: 92 mo
MDC IDC MSMT LEADCHNL RA IMPEDANCE VALUE: 475 Ohm
MDC IDC MSMT LEADCHNL RA SENSING INTR AMPL: 3.375 mV
MDC IDC MSMT LEADCHNL RA SENSING INTR AMPL: 3.375 mV
MDC IDC MSMT LEADCHNL RV PACING THRESHOLD AMPLITUDE: 0.375 V
MDC IDC STAT BRADY AP VS PERCENT: 0 %
MDC IDC STAT BRADY AS VS PERCENT: 0.03 %
MDC IDC STAT BRADY RV PERCENT PACED: 99.94 %

## 2018-06-04 ENCOUNTER — Ambulatory Visit (INDEPENDENT_AMBULATORY_CARE_PROVIDER_SITE_OTHER): Payer: Federal, State, Local not specified - PPO | Admitting: *Deleted

## 2018-06-04 DIAGNOSIS — I442 Atrioventricular block, complete: Secondary | ICD-10-CM | POA: Diagnosis not present

## 2018-06-04 NOTE — Progress Notes (Signed)
Remote pacemaker transmission.   

## 2018-06-06 ENCOUNTER — Encounter: Payer: Self-pay | Admitting: Cardiology

## 2018-06-25 LAB — CUP PACEART REMOTE DEVICE CHECK
Battery Remaining Longevity: 90 mo
Battery Voltage: 3.01 V
Brady Statistic AP VP Percent: 14.47 %
Brady Statistic AS VP Percent: 85.52 %
Brady Statistic RA Percent Paced: 14.44 %
Implantable Lead Implant Date: 20171205
Implantable Lead Location: 753859
Implantable Lead Model: 5076
Lead Channel Impedance Value: 342 Ohm
Lead Channel Impedance Value: 532 Ohm
Lead Channel Pacing Threshold Amplitude: 0.75 V
Lead Channel Pacing Threshold Pulse Width: 0.4 ms
Lead Channel Pacing Threshold Pulse Width: 0.4 ms
Lead Channel Sensing Intrinsic Amplitude: 3.625 mV
Lead Channel Sensing Intrinsic Amplitude: 3.625 mV
Lead Channel Setting Pacing Pulse Width: 0.4 ms
MDC IDC LEAD IMPLANT DT: 20160715
MDC IDC LEAD LOCATION: 753860
MDC IDC MSMT LEADCHNL RA IMPEDANCE VALUE: 475 Ohm
MDC IDC MSMT LEADCHNL RV IMPEDANCE VALUE: 589 Ohm
MDC IDC MSMT LEADCHNL RV PACING THRESHOLD AMPLITUDE: 0.375 V
MDC IDC MSMT LEADCHNL RV SENSING INTR AMPL: 15.5 mV
MDC IDC MSMT LEADCHNL RV SENSING INTR AMPL: 15.5 mV
MDC IDC PG IMPLANT DT: 20171205
MDC IDC SESS DTM: 20190703160043
MDC IDC SET LEADCHNL RA PACING AMPLITUDE: 1.5 V
MDC IDC SET LEADCHNL RV PACING AMPLITUDE: 2.5 V
MDC IDC SET LEADCHNL RV SENSING SENSITIVITY: 4 mV
MDC IDC STAT BRADY AP VS PERCENT: 0 %
MDC IDC STAT BRADY AS VS PERCENT: 0.01 %
MDC IDC STAT BRADY RV PERCENT PACED: 99.98 %

## 2018-09-03 ENCOUNTER — Ambulatory Visit (INDEPENDENT_AMBULATORY_CARE_PROVIDER_SITE_OTHER): Payer: Federal, State, Local not specified - PPO | Admitting: *Deleted

## 2018-09-03 ENCOUNTER — Telehealth: Payer: Self-pay | Admitting: Cardiology

## 2018-09-03 DIAGNOSIS — I442 Atrioventricular block, complete: Secondary | ICD-10-CM

## 2018-09-03 NOTE — Telephone Encounter (Signed)
LMOVM reminding pt to send remote transmission.   

## 2018-09-04 NOTE — Progress Notes (Signed)
Remote pacemaker transmission.   

## 2018-09-09 ENCOUNTER — Ambulatory Visit: Payer: Federal, State, Local not specified - PPO | Admitting: Internal Medicine

## 2018-09-13 LAB — CUP PACEART REMOTE DEVICE CHECK
Battery Remaining Longevity: 88 mo
Brady Statistic AP VS Percent: 0 %
Brady Statistic AS VP Percent: 87.04 %
Brady Statistic RA Percent Paced: 12.94 %
Brady Statistic RV Percent Paced: 99.98 %
Date Time Interrogation Session: 20191002165141
Implantable Lead Implant Date: 20171205
Implantable Lead Location: 753860
Implantable Lead Model: 5076
Lead Channel Impedance Value: 589 Ohm
Lead Channel Pacing Threshold Pulse Width: 0.4 ms
Lead Channel Sensing Intrinsic Amplitude: 10.375 mV
Lead Channel Sensing Intrinsic Amplitude: 10.375 mV
Lead Channel Setting Pacing Amplitude: 2.5 V
Lead Channel Setting Pacing Pulse Width: 0.4 ms
Lead Channel Setting Sensing Sensitivity: 4 mV
MDC IDC LEAD IMPLANT DT: 20160715
MDC IDC LEAD LOCATION: 753859
MDC IDC MSMT BATTERY VOLTAGE: 3.01 V
MDC IDC MSMT LEADCHNL RA IMPEDANCE VALUE: 323 Ohm
MDC IDC MSMT LEADCHNL RA IMPEDANCE VALUE: 437 Ohm
MDC IDC MSMT LEADCHNL RA PACING THRESHOLD AMPLITUDE: 0.75 V
MDC IDC MSMT LEADCHNL RA SENSING INTR AMPL: 3.125 mV
MDC IDC MSMT LEADCHNL RA SENSING INTR AMPL: 3.125 mV
MDC IDC MSMT LEADCHNL RV IMPEDANCE VALUE: 532 Ohm
MDC IDC MSMT LEADCHNL RV PACING THRESHOLD AMPLITUDE: 0.375 V
MDC IDC MSMT LEADCHNL RV PACING THRESHOLD PULSEWIDTH: 0.4 ms
MDC IDC PG IMPLANT DT: 20171205
MDC IDC SET LEADCHNL RA PACING AMPLITUDE: 1.5 V
MDC IDC STAT BRADY AP VP PERCENT: 12.95 %
MDC IDC STAT BRADY AS VS PERCENT: 0.01 %

## 2018-12-04 ENCOUNTER — Encounter: Payer: Self-pay | Admitting: Nurse Practitioner

## 2018-12-04 ENCOUNTER — Ambulatory Visit: Payer: Federal, State, Local not specified - PPO | Admitting: Nurse Practitioner

## 2018-12-04 VITALS — BP 146/82 | HR 88 | Ht 69.0 in | Wt 220.6 lb

## 2018-12-04 DIAGNOSIS — I10 Essential (primary) hypertension: Secondary | ICD-10-CM | POA: Diagnosis not present

## 2018-12-04 DIAGNOSIS — I442 Atrioventricular block, complete: Secondary | ICD-10-CM | POA: Diagnosis not present

## 2018-12-04 LAB — CUP PACEART INCLINIC DEVICE CHECK
Implantable Lead Location: 753860
Implantable Lead Model: 5076
Implantable Lead Model: 5076
Implantable Pulse Generator Implant Date: 20171205
MDC IDC LEAD IMPLANT DT: 20160715
MDC IDC LEAD IMPLANT DT: 20171205
MDC IDC LEAD LOCATION: 753859
MDC IDC SESS DTM: 20200102112851

## 2018-12-04 NOTE — Patient Instructions (Signed)
Medication Instructions:  none If you need a refill on your cardiac medications before your next appointment, please call your pharmacy.   Lab work: none If you have labs (blood work) drawn today and your tests are completely normal, you will receive your results only by: Marland Kitchen MyChart Message (if you have MyChart) OR . A paper copy in the mail If you have any lab test that is abnormal or we need to change your treatment, we will call you to review the results.  Testing/Procedures: none  Follow-Up: At [facility], you and your health needs are our priority.  As part of our continuing mission to provide you with exceptional heart care, we have created designated Provider Care Teams.  These Care Teams include your primary Cardiologist (physician) and Advanced Practice Providers (APPs -  Physician Assistants and Nurse Practitioners) who all work together to provide you with the care you need, when you need it. You will need a follow up appointment in 1 years.  Please call our office 2 months in advance to schedule this appointment.  You may see Dr Graciela Husbands or one of the following Advanced Practice Providers on your designated Care Team:   Gypsy Balsam, NP . Francis Dowse, PA-C  Any Other Special Instructions Will Be Listed Below (If Applicable). Remote monitoring is used to monitor your Pacemaker from home. This monitoring reduces the number of office visits required to check your device to one time per year. It allows [place] to keep an eye on the functioning of your device to ensure it is working properly. You are scheduled for a device check from home on 03/05/19. You may send your transmission at any time that day. If you have a wireless device, the transmission will be sent automatically. After your physician reviews your transmission, you will receive a postcard with your next transmission date.  PLEASE GIVE PATIENT BILLING DEPARTMENT PHONE NUMBER

## 2018-12-04 NOTE — Progress Notes (Addendum)
Electrophysiology Office Note Date: 12/04/2018  ID:  Jeremy Abbott, DOB 07/25/1946, MRN [id number]  PCP: Jeremy Abbott Electrophysiologist: Jeremy Abbott  CC: Pacemaker follow-up  Jeremy StallGeorge P Abbott is a 73 y.o. male seen today for Dr Jeremy Abbott.  He presents today for routine electrophysiology followup.  Since last being seen in our clinic, the patient reports doing very well.  He denies chest pain, palpitations, dyspnea, PND, orthopnea, nausea, vomiting, dizziness, syncope, edema, weight gain, or early satiety.  Device History: MDT dual chamber PPM implanted 06/17/15, s/p RV lead extraction 2/2 high thresholds with new RV lead and generator 11/06/16  Past Medical History:  Diagnosis Date  . ADHD (attention deficit hyperactivity disorder)   . Adjustment disorder with anxiety   . Allergic rhinitis   . Anxiety   . Complete heart block (HCC)    a. s/p Medtronic PPM 06/17/15 (device dependent).  . Depression   . Diabetes mellitus (HCC)   . Essential hypertension   . Fracture of right clavicle 11/09/2009   Chronic right collarbone pain-non union  . Headache   . Heart murmur    at one time, not significant per pt  . Hyperlipidemia   . Hypokalemia   . NSAID long-term use   . Presence of permanent cardiac pacemaker   . RECTAL BLEEDING 01/22/2008   Past Surgical History:  Procedure Laterality Date  . COLONOSCOPY    . EP IMPLANTABLE DEVICE N/A 06/17/2015   Procedure: Pacemaker Implant;  Surgeon: Jeremy SalviaSteven C Klein, Abbott;  [facility];  Service: Cardiovascular;  Laterality: N/A;  . EP IMPLANTABLE DEVICE Right 11/06/2016   Procedure: INSERTION of new right ventricle pacemaker lead  and insertion of new pacemaker;  Surgeon: Jeremy MawGregg W Taylor, Abbott;  [facility];  Service: Cardiovascular;  Laterality: Right;  . ORIF CLAVICLE FRACTURE  2/08   s/p collarbone fx with Fall-Murphy Wainer  . PACEMAKER LEAD REMOVAL Left 11/06/2016   Procedure: PACEMAKER LEAD EXTRACTION;  Surgeon: Jeremy MawGregg W Taylor,  Abbott;  [facility];  Service: Cardiovascular;  Laterality: Left;  DR. Tyrone Abbott TO BACKUP CASE  . TEE WITHOUT CARDIOVERSION N/A 11/06/2016   Procedure: TRANSESOPHAGEAL ECHOCARDIOGRAM (TEE);  Surgeon: Jeremy MawGregg W Taylor, Abbott;  [facility];  Service: Cardiovascular;  Laterality: N/A;  . TONSILLECTOMY      Current Outpatient Medications  Medication Sig Dispense Refill  . amphetamine-dextroamphetamine (ADDERALL XR) 25 MG 24 hr capsule Take 25 mg by mouth every morning.    Marland Kitchen. aspirin-acetaminophen-caffeine (EXCEDRIN MIGRAINE) 250-250-65 MG per tablet Take 2 tablets by mouth every 6 (six) hours as needed for headache.    . fluticasone (FLONASE) 50 MCG/ACT nasal spray Place 1-2 sprays into both nostrils daily as needed for allergies or rhinitis.    Marland Kitchen. glucose blood (ONE TOUCH ULTRA TEST) test strip Once daily dx 250.00 100 each 11  . HYDROcodone-acetaminophen (NORCO) 7.5-325 MG tablet Take 1 tablet by mouth daily as needed (pain).     Marland Kitchen. ibuprofen (ADVIL,MOTRIN) 200 MG tablet Take 600 mg by mouth every 8 (eight) hours as needed for moderate pain.     Marland Kitchen. lisinopril (PRINIVIL,ZESTRIL) 20 MG tablet Take 20 mg by mouth daily.    . metFORMIN (GLUCOPHAGE-XR) 500 MG 24 hr tablet Take 1,000 mg by mouth every morning.     . Multiple Vitamins-Minerals (ZINC PO) Take 1 tablet by mouth daily.    [name]. ONETOUCH DELICA LANCETS MISC Once daily dx 250.00 Once daily dx 250.00 100 each 11  . sildenafil (  VIAGRA) 100 MG tablet Take 100 mg by mouth daily as needed for erectile dysfunction.    . simvastatin (ZOCOR) 40 MG tablet Take 1 tablet (40 mg total) by mouth at bedtime. 90 tablet 3  . tamsulosin (FLOMAX) 0.4 MG CAPS capsule Take 0.4 mg by mouth daily.     No current facility-administered medications for this visit.     Allergies:   No known allergies   Social History: Social History   Socioeconomic History  . Marital status: Single    Spouse name: Not on file  . Number of children: Not on file  . Years of  education: Not on file  . Highest education level: Not on file  Occupational History  . Occupation: POST SERVICES    Employer: [employer]    Comment: Retired0  Social Needs  . Financial resource strain: Not on file  . Food insecurity:    Worry: Not on file    Inability: Not on file  . Transportation needs:    Medical: Not on file    Non-medical: Not on file  Tobacco Use  . Smoking status: Former Games developermoker  . Smokeless tobacco: Never Used  Substance and Sexual Activity  . Alcohol use: No  . Drug use: No  . Sexual activity: Not on file  Lifestyle  . Physical activity:    Days per week: Not on file    Minutes per session: Not on file  . Stress: Not on file  Relationships  . Social connections:    Talks on phone: Not on file    Gets together: Not on file    Attends religious service: Not on file    Active member of club or organization: Not on file    Attends meetings of clubs or organizations: Not on file    Relationship status: Not on file  . Intimate partner violence:    Fear of current or ex partner: Not on file    Emotionally abused: Not on file    Physically abused: Not on file    Forced sexual activity: Not on file  Other Topics Concern  . Not on file  Social History Narrative  . Not on file    Family History: Family History  Problem Relation Age of Onset  . Heart failure Father   . Diabetes Daughter   . Heart attack Neg Hx   . Stroke Neg Hx      Review of Systems: All other systems reviewed and are otherwise negative except as noted above.   Physical Exam: VS:  BP (!) 146/82   Pulse 88   Ht 5\' 9"  (1.753 m)   Wt 220 lb 9.6 oz (100.1 kg)   SpO2 97%   BMI 32.58 kg/m  , BMI Body mass index is 32.58 kg/m.  GEN- The patient is well appearing, alert and oriented x 3 today.   HEENT: normocephalic, atraumatic; sclera clear, conjunctiva pink; hearing intact; oropharynx clear; neck supple  Lungs- Clear to ausculation bilaterally, normal work of  breathing.  No wheezes, rales, rhonchi Heart- Regular rate and rhythm (paced) GI- soft, non-tender, non-distended, bowel sounds present  Extremities- no clubbing, cyanosis, or edema  MS- no significant deformity or atrophy Skin- warm and dry, no rash or lesion; PPM pocket well healed Psych- euthymic mood, full affect Neuro- strength and sensation are intact  PPM Interrogation- reviewed in detail today,  See PACEART report  EKG:  EKG is not ordered today.  Recent Labs: 12/04/2017: BUN 13;  Creatinine, Ser 0.77; Magnesium 2.1; Potassium 4.6; Sodium 144   Wt Readings from Last 3 Encounters:  12/04/18 220 lb 9.6 oz (100.1 kg)  12/12/17 224 lb (101.6 kg)  12/04/17 224 lb 6.4 oz (101.8 kg)     Assessment and Plan:  1.  Complete heart block Normal PPM function See Pace Art report No changes today  2.  HTN Stable No change required today  3.  NSVT Asymptomatic Echo and myoview low risk last year    Current medicines are reviewed at length with the patient today.   The patient does not have concerns regarding his medicines.  The following changes were made today:  none  Labs/ tests ordered today include: none Orders Placed This Encounter  Procedures  . CUP PACEART INCLINIC DEVICE CHECK  . EKG 12-Lead     Disposition:   Follow up with [facility], Dr Jeremy Husbands 1 year    Signed, Gypsy Balsam, NP 12/04/2018 11:52 AM  [facility] [address] [place] [address] [phone] (office) [phone] (fax)

## 2018-12-05 ENCOUNTER — Encounter: Payer: Self-pay | Admitting: Cardiology

## 2019-03-06 ENCOUNTER — Ambulatory Visit (INDEPENDENT_AMBULATORY_CARE_PROVIDER_SITE_OTHER): Payer: Federal, State, Local not specified - PPO | Admitting: *Deleted

## 2019-03-06 ENCOUNTER — Other Ambulatory Visit: Payer: Self-pay

## 2019-03-06 DIAGNOSIS — I472 Ventricular tachycardia, unspecified: Secondary | ICD-10-CM

## 2019-03-06 DIAGNOSIS — I442 Atrioventricular block, complete: Secondary | ICD-10-CM

## 2019-03-06 LAB — CUP PACEART REMOTE DEVICE CHECK
Battery Remaining Longevity: 84 mo
Battery Voltage: 3 V
Brady Statistic AP VP Percent: 13.02 %
Brady Statistic AP VS Percent: 0 %
Brady Statistic AS VP Percent: 86.93 %
Brady Statistic AS VS Percent: 0.05 %
Brady Statistic RA Percent Paced: 12.98 %
Brady Statistic RV Percent Paced: 99.87 %
Date Time Interrogation Session: 20200402205732
Implantable Lead Implant Date: 20160715
Implantable Lead Implant Date: 20171205
Implantable Lead Location: 753859
Implantable Lead Location: 753860
Implantable Lead Model: 5076
Implantable Lead Model: 5076
Implantable Pulse Generator Implant Date: 20171205
Lead Channel Impedance Value: 342 Ohm
Lead Channel Impedance Value: 475 Ohm
Lead Channel Impedance Value: 532 Ohm
Lead Channel Impedance Value: 589 Ohm
Lead Channel Pacing Threshold Amplitude: 0.25 V
Lead Channel Pacing Threshold Amplitude: 0.875 V
Lead Channel Pacing Threshold Pulse Width: 0.4 ms
Lead Channel Pacing Threshold Pulse Width: 0.4 ms
Lead Channel Sensing Intrinsic Amplitude: 24.125 mV
Lead Channel Sensing Intrinsic Amplitude: 24.125 mV
Lead Channel Sensing Intrinsic Amplitude: 3.375 mV
Lead Channel Sensing Intrinsic Amplitude: 3.375 mV
Lead Channel Setting Pacing Amplitude: 2 V
Lead Channel Setting Pacing Amplitude: 2.5 V
Lead Channel Setting Pacing Pulse Width: 0.4 ms
Lead Channel Setting Sensing Sensitivity: 4 mV

## 2019-03-12 NOTE — Progress Notes (Signed)
Remote pacemaker transmission.   

## 2019-06-01 ENCOUNTER — Telehealth: Payer: Self-pay | Admitting: Internal Medicine

## 2019-06-01 NOTE — Telephone Encounter (Signed)
Spoke w/ pt and he agreed to reschedule his remote appt to 06/08/2019.

## 2019-06-01 NOTE — Telephone Encounter (Signed)
  Patient needs to change his remote transmission date. He will be out of town on 06/04/19 but he will be back in town on 06/05/19 if it could be changed to that day.

## 2019-06-08 ENCOUNTER — Ambulatory Visit (INDEPENDENT_AMBULATORY_CARE_PROVIDER_SITE_OTHER): Payer: Federal, State, Local not specified - PPO | Admitting: *Deleted

## 2019-06-08 DIAGNOSIS — I442 Atrioventricular block, complete: Secondary | ICD-10-CM | POA: Diagnosis not present

## 2019-06-08 LAB — CUP PACEART REMOTE DEVICE CHECK
Battery Remaining Longevity: 80 mo
Battery Voltage: 3 V
Brady Statistic AP VP Percent: 10.38 %
Brady Statistic AP VS Percent: 0 %
Brady Statistic AS VP Percent: 89.58 %
Brady Statistic AS VS Percent: 0.04 %
Brady Statistic RA Percent Paced: 10.37 %
Brady Statistic RV Percent Paced: 99.92 %
Date Time Interrogation Session: 20200706175657
Implantable Lead Implant Date: 20160715
Implantable Lead Implant Date: 20171205
Implantable Lead Location: 753859
Implantable Lead Location: 753860
Implantable Lead Model: 5076
Implantable Lead Model: 5076
Implantable Pulse Generator Implant Date: 20171205
Lead Channel Impedance Value: 342 Ohm
Lead Channel Impedance Value: 456 Ohm
Lead Channel Impedance Value: 551 Ohm
Lead Channel Impedance Value: 608 Ohm
Lead Channel Pacing Threshold Amplitude: 0.25 V
Lead Channel Pacing Threshold Amplitude: 0.875 V
Lead Channel Pacing Threshold Pulse Width: 0.4 ms
Lead Channel Pacing Threshold Pulse Width: 0.4 ms
Lead Channel Sensing Intrinsic Amplitude: 16.75 mV
Lead Channel Sensing Intrinsic Amplitude: 16.75 mV
Lead Channel Sensing Intrinsic Amplitude: 3.125 mV
Lead Channel Sensing Intrinsic Amplitude: 3.125 mV
Lead Channel Setting Pacing Amplitude: 1.75 V
Lead Channel Setting Pacing Amplitude: 2.5 V
Lead Channel Setting Pacing Pulse Width: 0.4 ms
Lead Channel Setting Sensing Sensitivity: 4 mV

## 2019-06-15 ENCOUNTER — Encounter: Payer: Self-pay | Admitting: Cardiology

## 2019-06-15 NOTE — Progress Notes (Signed)
Remote pacemaker transmission.   

## 2019-09-07 ENCOUNTER — Ambulatory Visit (INDEPENDENT_AMBULATORY_CARE_PROVIDER_SITE_OTHER): Payer: Federal, State, Local not specified - PPO | Admitting: *Deleted

## 2019-09-07 DIAGNOSIS — I452 Bifascicular block: Secondary | ICD-10-CM

## 2019-09-07 DIAGNOSIS — I442 Atrioventricular block, complete: Secondary | ICD-10-CM

## 2019-09-08 LAB — CUP PACEART REMOTE DEVICE CHECK
Battery Remaining Longevity: 72 mo
Battery Voltage: 3 V
Brady Statistic AP VP Percent: 12.9 %
Brady Statistic AP VS Percent: 0 %
Brady Statistic AS VP Percent: 87.08 %
Brady Statistic AS VS Percent: 0.03 %
Brady Statistic RA Percent Paced: 12.88 %
Brady Statistic RV Percent Paced: 99.95 %
Date Time Interrogation Session: 20201005140058
Implantable Lead Implant Date: 20160715
Implantable Lead Implant Date: 20171205
Implantable Lead Location: 753859
Implantable Lead Location: 753860
Implantable Lead Model: 5076
Implantable Lead Model: 5076
Implantable Pulse Generator Implant Date: 20171205
Lead Channel Impedance Value: 323 Ohm
Lead Channel Impedance Value: 456 Ohm
Lead Channel Impedance Value: 494 Ohm
Lead Channel Impedance Value: 551 Ohm
Lead Channel Pacing Threshold Amplitude: 0.25 V
Lead Channel Pacing Threshold Amplitude: 0.75 V
Lead Channel Pacing Threshold Pulse Width: 0.4 ms
Lead Channel Pacing Threshold Pulse Width: 0.4 ms
Lead Channel Sensing Intrinsic Amplitude: 11 mV
Lead Channel Sensing Intrinsic Amplitude: 11 mV
Lead Channel Sensing Intrinsic Amplitude: 3.125 mV
Lead Channel Sensing Intrinsic Amplitude: 3.125 mV
Lead Channel Setting Pacing Amplitude: 1.75 V
Lead Channel Setting Pacing Amplitude: 2.5 V
Lead Channel Setting Pacing Pulse Width: 0.4 ms
Lead Channel Setting Sensing Sensitivity: 4 mV

## 2019-09-14 NOTE — Progress Notes (Signed)
Remote pacemaker transmission.   

## 2019-12-06 DIAGNOSIS — Z950 Presence of cardiac pacemaker: Secondary | ICD-10-CM | POA: Insufficient documentation

## 2019-12-06 DIAGNOSIS — I471 Supraventricular tachycardia: Secondary | ICD-10-CM | POA: Insufficient documentation

## 2019-12-06 DIAGNOSIS — I4719 Other supraventricular tachycardia: Secondary | ICD-10-CM | POA: Insufficient documentation

## 2019-12-08 ENCOUNTER — Other Ambulatory Visit: Payer: Self-pay

## 2019-12-08 ENCOUNTER — Encounter: Payer: Self-pay | Admitting: Internal Medicine

## 2019-12-08 ENCOUNTER — Telehealth: Payer: Self-pay

## 2019-12-08 ENCOUNTER — Ambulatory Visit: Payer: Federal, State, Local not specified - PPO | Admitting: Internal Medicine

## 2019-12-08 VITALS — BP 160/96 | HR 108 | Ht 69.0 in | Wt 222.2 lb

## 2019-12-08 DIAGNOSIS — Z950 Presence of cardiac pacemaker: Secondary | ICD-10-CM | POA: Diagnosis not present

## 2019-12-08 DIAGNOSIS — I471 Supraventricular tachycardia: Secondary | ICD-10-CM | POA: Diagnosis not present

## 2019-12-08 DIAGNOSIS — I442 Atrioventricular block, complete: Secondary | ICD-10-CM | POA: Diagnosis not present

## 2019-12-08 LAB — CUP PACEART INCLINIC DEVICE CHECK
Battery Remaining Longevity: 71 mo
Battery Voltage: 3 V
Brady Statistic AP VP Percent: 11.85 %
Brady Statistic AP VS Percent: 0 %
Brady Statistic AS VP Percent: 88.12 %
Brady Statistic AS VS Percent: 0.03 %
Brady Statistic RA Percent Paced: 11.83 %
Brady Statistic RV Percent Paced: 99.93 %
Date Time Interrogation Session: 20210105151900
Implantable Lead Implant Date: 20160715
Implantable Lead Implant Date: 20171205
Implantable Lead Location: 753859
Implantable Lead Location: 753860
Implantable Lead Model: 5076
Implantable Lead Model: 5076
Implantable Pulse Generator Implant Date: 20171205
Lead Channel Impedance Value: 342 Ohm
Lead Channel Impedance Value: 475 Ohm
Lead Channel Impedance Value: 513 Ohm
Lead Channel Impedance Value: 570 Ohm
Lead Channel Pacing Threshold Amplitude: 0.5 V
Lead Channel Pacing Threshold Amplitude: 0.75 V
Lead Channel Pacing Threshold Pulse Width: 0.4 ms
Lead Channel Pacing Threshold Pulse Width: 0.4 ms
Lead Channel Sensing Intrinsic Amplitude: 3.625 mV
Lead Channel Sensing Intrinsic Amplitude: 3.875 mV
Lead Channel Setting Pacing Amplitude: 1.75 V
Lead Channel Setting Pacing Amplitude: 2.5 V
Lead Channel Setting Pacing Pulse Width: 0.4 ms
Lead Channel Setting Sensing Sensitivity: 4 mV

## 2019-12-08 NOTE — Telephone Encounter (Signed)
The pt had questions on our Covid screening protocols. He asked me is someone downstairs making sure a lot of people not using the elevators? I told him we do have staff downstairs that check pt temps, ask the prescreening questions, making sure everyone has on mask, and making sure only 2 people per elevators. He asked is [facility] giving employees the vaccine or not? I told him that we are getting the vaccines. The high risk and staff who is in direct pt contact get the vaccines first. Pt states he know he is asking a lot of questions but he is high risk and want to make sure safety precautions are taken.

## 2019-12-08 NOTE — Progress Notes (Signed)
Patient Care Team: Maury Dus, MD as PCP - General (Family Medicine)   HPI  Jeremy Abbott is a 74 y.o. male Seen in followup for PM implanted 7/16 for CHB Medtronic complicated by high left ventricular pacing thresholds associated with syncope; he underwent extraction and new lead implantation (GT) 12/17   DATE TEST EF   7/16 Echo   65 %   1/19 Echo   55-60 %   1/19 Myoview 47% No ischemia    Date Cr K Hgb  1/19 0.77 4.6     4/20 0.71 3.9 14.6   The patient denies chest pain, shortness of breath, nocturnal dyspnea, orthopnea or peripheral edema.  There have been no palpitations, lightheadedness or syncope.    Hemoglobin A1c is down to about 5.6   Hx of HTN, DM    Past Medical History:  Diagnosis Date  . ADHD (attention deficit hyperactivity disorder)   . Adjustment disorder with anxiety   . Allergic rhinitis   . Anxiety   . Complete heart block (HCC)    a. s/p Medtronic PPM 06/17/15 (device dependent).  . Depression   . Diabetes mellitus ([place])   . Essential hypertension   . Fracture of right clavicle 11/09/2009   Chronic right collarbone pain-non union  . Headache   . Heart murmur    at one time, not significant per pt  . Hyperlipidemia   . Hypokalemia   . NSAID long-term use   . Presence of permanent cardiac pacemaker   . RECTAL BLEEDING 01/22/2008    Past Surgical History:  Procedure Laterality Date  . COLONOSCOPY    . EP IMPLANTABLE DEVICE N/A 06/17/2015   Procedure: Pacemaker Implant;  Surgeon: Deboraha Sprang, MD;  Location: [facility];  Service: Cardiovascular;  Laterality: N/A;  . EP IMPLANTABLE DEVICE Right 11/06/2016   Procedure: INSERTION of new right ventricle pacemaker lead  and insertion of new pacemaker;  Surgeon: Evans Lance, MD;  Location: [facility];  Service: Cardiovascular;  Laterality: Right;  . ORIF CLAVICLE FRACTURE  2/08   s/p collarbone fx with Fall-Murphy Wainer  . PACEMAKER LEAD REMOVAL Left 11/06/2016   Procedure:  PACEMAKER LEAD EXTRACTION;  Surgeon: Evans Lance, MD;  Location: [facility];  Service: Cardiovascular;  Laterality: Left;  DR. Servando Snare TO BACKUP CASE  . TEE WITHOUT CARDIOVERSION N/A 11/06/2016   Procedure: TRANSESOPHAGEAL ECHOCARDIOGRAM (TEE);  Surgeon: Evans Lance, MD;  Location: [facility] OR;  Service: Cardiovascular;  Laterality: N/A;  . TONSILLECTOMY      Current Outpatient Medications  Medication Sig Dispense Refill  . amphetamine-dextroamphetamine (ADDERALL XR) 25 MG 24 hr capsule Take 25 mg by mouth every morning.    Marland Kitchen aspirin-acetaminophen-caffeine (EXCEDRIN MIGRAINE) 250-250-65 MG per tablet Take 2 tablets by mouth every 6 (six) hours as needed for headache.    . glucose blood (ONE TOUCH ULTRA TEST) test strip Once daily dx 250.00 100 each 11  . HYDROcodone-acetaminophen (NORCO) 7.5-325 MG tablet Take 1 tablet by mouth daily as needed (pain).     Marland Kitchen ibuprofen (ADVIL,MOTRIN) 200 MG tablet Take 600 mg by mouth every 8 (eight) hours as needed for moderate pain.     Marland Kitchen lisinopril (PRINIVIL,ZESTRIL) 20 MG tablet Take 20 mg by mouth daily.    . metFORMIN (GLUCOPHAGE-XR) 500 MG 24 hr tablet Take 1,000 mg by mouth every morning.     . Multiple Vitamins-Minerals (ZINC PO) Take 1 tablet by mouth daily.    Marland Kitchen  ONETOUCH DELICA LANCETS MISC Once daily dx 250.00 Once daily dx 250.00 100 each 11  . sildenafil (VIAGRA) 100 MG tablet Take 100 mg by mouth daily as needed for erectile dysfunction.    . simvastatin (ZOCOR) 40 MG tablet Take 1 tablet (40 mg total) by mouth at bedtime. 90 tablet 3  . tamsulosin (FLOMAX) 0.4 MG CAPS capsule Take 0.4 mg by mouth daily.     No current facility-administered medications for this visit.    Allergies  Allergen Reactions  . No Known Allergies       Review of Systems negative except from HPI and PMH  Physical Exam BP (!) 160/96   Pulse (!) 108   Ht 5\' 9"  (1.753 m)   Wt 222 lb 3.2 oz (100.8 kg)   SpO2 98%   BMI 32.81 kg/m  Well developed and well nourished  in no acute distress HENT normal Neck supple with JVP-flat Clear Device pocket well healed; without hematoma or erythema.  There is no tethering  Regular rate and rhythm, no   murmur Abd-soft with active BS No Clubbing cyanosis   edema Skin-warm and dry A & Oriented  Grossly normal sensory and motor function  ECG  P-synchronous/ AV  pacing @ 108  Assessment and  Plan  Complete heart block  Pacemaker Medtronic   S/p revision of RV lead   Hypertension   Atrial tach  Sinus tach   VT NS      With elevated BP and HR in the context of   steroid therapy and his first excursion in the midst of the pandemic.  We will plan to have him check it again in a couple of weeks once the steroids are over.  No evidence of heart failure.  We will plan next year to repeat his echocardiogram to look for evidence of LV dysfunction in the setting of complete heart block in 100% ventricular pacing.  Blood pressure at home have been well controlled

## 2019-12-08 NOTE — Patient Instructions (Signed)
Medication Instructions:  Your physician recommends that you continue on your current medications as directed. Please refer to the Current Medication list given to you today.  Labwork: None ordered.  Testing/Procedures: None ordered.  Follow-Up: Your physician wants you to follow-up in: one year with Dr Graciela Husbands. You will receive a reminder letter in the mail two months in advance. If you don't receive a letter, please call our office to schedule the follow-up appointment.  Remote monitoring is used to monitor your Pacemaker of ICD from home. This monitoring reduces the number of office visits required to check your device to one time per year. It allows [place] to keep an eye on the functioning of your device to ensure it is working properly.  Any Other Special Instructions Will Be Listed Below (If Applicable).  Please begin checking you blood pressure and heart rate every other day for 2 weeks and send [place] readings via MyChart.  If you need a refill on your cardiac medications before your next appointment, please call your pharmacy.

## 2020-01-02 ENCOUNTER — Ambulatory Visit: Payer: Federal, State, Local not specified - PPO

## 2020-01-04 IMAGING — NM NM MISC PROCEDURE
6 series · 36 of 36 positions shown · non-contrast
Comparison: none

[Series 1: rest · 6.51mm/px · 6 of 64 frames shown]
[frame 6/64]
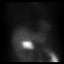
[frame 16/64]
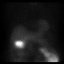
[frame 27/64]
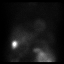
[frame 38/64]
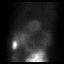
[frame 48/64]
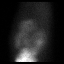
[frame 59/64]
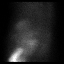

[Series 1: wbr_r-proj_st rest · 6.51mm/px · 6 of 64 frames shown]
[frame 6/64]
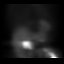
[frame 16/64]
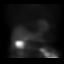
[frame 27/64]
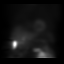
[frame 38/64]
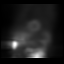
[frame 48/64]
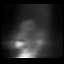
[frame 59/64]
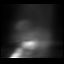

[Series 2: stress - gated · 6.51mm/px · 6 of 512 frames shown]
[frame 43/512]
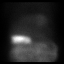
[frame 128/512]
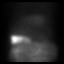
[frame 214/512]
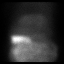
[frame 299/512]
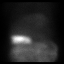
[frame 384/512]
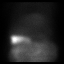
[frame 470/512]
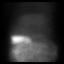

[Series 2: wbr_s-proj_st stress - gated · 6.51mm/px · 6 of 512 frames shown]
[frame 43/512]
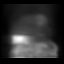
[frame 128/512]
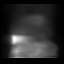
[frame 214/512]
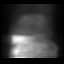
[frame 299/512]
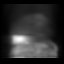
[frame 384/512]
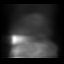
[frame 470/512]
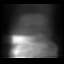

[Series 3: stress - perfusion · 6.51mm/px · 6 of 64 frames shown]
[frame 6/64]
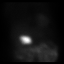
[frame 16/64]
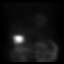
[frame 27/64]
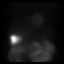
[frame 38/64]
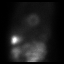
[frame 48/64]
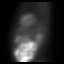
[frame 59/64]
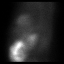

[Series 3: wbr_s-proj_st stress - perfusion · 6.51mm/px · 6 of 64 frames shown]
[frame 6/64]
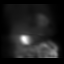
[frame 16/64]
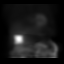
[frame 27/64]
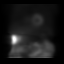
[frame 38/64]
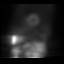
[frame 48/64]
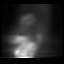
[frame 59/64]
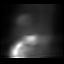

[36 of 36 positions shown; findings below may reference images not displayed]

Canned report from images found in remote index.

Refer to host system for actual result text.

## 2020-01-07 ENCOUNTER — Ambulatory Visit: Payer: Federal, State, Local not specified - PPO

## 2020-01-08 ENCOUNTER — Ambulatory Visit: Payer: Federal, State, Local not specified - PPO | Attending: Internal Medicine

## 2020-01-08 DIAGNOSIS — Z23 Encounter for immunization: Secondary | ICD-10-CM | POA: Insufficient documentation

## 2020-01-08 NOTE — Progress Notes (Signed)
   Covid-19 Vaccination Clinic  Name:  Jeremy Abbott    MRN: [id number] DOB: Apr 04, 1946  01/08/2020  [name] was observed post Covid-19 immunization for 15 minutes without incidence. He was provided with Vaccine Information Sheet and instruction to access the V-Safe system.   [name] was instructed to call 911 with any severe reactions post vaccine: Marland Kitchen Difficulty breathing  . Swelling of your face and throat  . A fast heartbeat  . A bad rash all over your body  . Dizziness and weakness    Immunizations Administered    Name Date Dose VIS Date Route   Pfizer COVID-19 Vaccine 01/08/2020  2:23 PM 0.3 mL 11/13/2019 Intramuscular   Manufacturer: [employer], [employer]   Lot: [id number]   NDC: [address]-7

## 2020-02-02 ENCOUNTER — Ambulatory Visit: Payer: Federal, State, Local not specified - PPO | Attending: Internal Medicine

## 2020-02-02 DIAGNOSIS — Z23 Encounter for immunization: Secondary | ICD-10-CM | POA: Insufficient documentation

## 2020-02-02 NOTE — Progress Notes (Signed)
   Covid-19 Vaccination Clinic  Name:  Jeremy Abbott    MRN: [id number] DOB: 1946-04-21  02/02/2020  Mr. Jeremy Abbott was observed post Covid-19 immunization for 15 minutes without incident. He was provided with Vaccine Information Sheet and instruction to access the V-Safe system.   Mr. Jeremy Abbott was instructed to call 911 with any severe reactions post vaccine: Marland Kitchen Difficulty breathing  . Swelling of face and throat  . A fast heartbeat  . A bad rash all over body  . Dizziness and weakness   Immunizations Administered    Name Date Dose VIS Date Route   Pfizer COVID-19 Vaccine 02/02/2020  2:43 PM 0.3 mL 11/13/2019 Intramuscular   Manufacturer: [employer], [employer]   Lot: [id number]   NDC: [address]-1

## 2020-03-07 ENCOUNTER — Ambulatory Visit (INDEPENDENT_AMBULATORY_CARE_PROVIDER_SITE_OTHER): Payer: Federal, State, Local not specified - PPO | Admitting: *Deleted

## 2020-03-07 DIAGNOSIS — I442 Atrioventricular block, complete: Secondary | ICD-10-CM

## 2020-03-07 LAB — CUP PACEART REMOTE DEVICE CHECK
Battery Remaining Longevity: 69 mo
Battery Voltage: 3 V
Brady Statistic AP VP Percent: 15.98 %
Brady Statistic AP VS Percent: 0 %
Brady Statistic AS VP Percent: 84 %
Brady Statistic AS VS Percent: 0.02 %
Brady Statistic RA Percent Paced: 15.98 %
Brady Statistic RV Percent Paced: 99.98 %
Date Time Interrogation Session: 20210405104359
Implantable Lead Implant Date: 20160715
Implantable Lead Implant Date: 20171205
Implantable Lead Location: 753859
Implantable Lead Location: 753860
Implantable Lead Model: 5076
Implantable Lead Model: 5076
Implantable Pulse Generator Implant Date: 20171205
Lead Channel Impedance Value: 323 Ohm
Lead Channel Impedance Value: 437 Ohm
Lead Channel Impedance Value: 475 Ohm
Lead Channel Impedance Value: 532 Ohm
Lead Channel Pacing Threshold Amplitude: 0.25 V
Lead Channel Pacing Threshold Amplitude: 0.75 V
Lead Channel Pacing Threshold Pulse Width: 0.4 ms
Lead Channel Pacing Threshold Pulse Width: 0.4 ms
Lead Channel Sensing Intrinsic Amplitude: 10.125 mV
Lead Channel Sensing Intrinsic Amplitude: 10.125 mV
Lead Channel Sensing Intrinsic Amplitude: 3.25 mV
Lead Channel Sensing Intrinsic Amplitude: 3.25 mV
Lead Channel Setting Pacing Amplitude: 1.5 V
Lead Channel Setting Pacing Amplitude: 2.5 V
Lead Channel Setting Pacing Pulse Width: 0.4 ms
Lead Channel Setting Sensing Sensitivity: 4 mV

## 2020-03-08 NOTE — Progress Notes (Signed)
PPM Remote  

## 2020-06-08 ENCOUNTER — Ambulatory Visit (INDEPENDENT_AMBULATORY_CARE_PROVIDER_SITE_OTHER): Payer: Federal, State, Local not specified - PPO | Admitting: *Deleted

## 2020-06-08 DIAGNOSIS — I442 Atrioventricular block, complete: Secondary | ICD-10-CM

## 2020-06-09 LAB — CUP PACEART REMOTE DEVICE CHECK
Battery Remaining Longevity: 60 mo
Battery Voltage: 3 V
Brady Statistic AP VP Percent: 34.01 %
Brady Statistic AP VS Percent: 0 %
Brady Statistic AS VP Percent: 65.98 %
Brady Statistic AS VS Percent: 0.01 %
Brady Statistic RA Percent Paced: 33.97 %
Brady Statistic RV Percent Paced: 99.99 %
Date Time Interrogation Session: 20210707125951
Implantable Lead Implant Date: 20160715
Implantable Lead Implant Date: 20171205
Implantable Lead Location: 753859
Implantable Lead Location: 753860
Implantable Lead Model: 5076
Implantable Lead Model: 5076
Implantable Pulse Generator Implant Date: 20171205
Lead Channel Impedance Value: 323 Ohm
Lead Channel Impedance Value: 361 Ohm
Lead Channel Impedance Value: 494 Ohm
Lead Channel Impedance Value: 551 Ohm
Lead Channel Pacing Threshold Amplitude: 0.25 V
Lead Channel Pacing Threshold Amplitude: 0.5 V
Lead Channel Pacing Threshold Pulse Width: 0.4 ms
Lead Channel Pacing Threshold Pulse Width: 0.4 ms
Lead Channel Sensing Intrinsic Amplitude: 11.625 mV
Lead Channel Sensing Intrinsic Amplitude: 11.625 mV
Lead Channel Sensing Intrinsic Amplitude: 3.125 mV
Lead Channel Sensing Intrinsic Amplitude: 3.125 mV
Lead Channel Setting Pacing Amplitude: 1.5 V
Lead Channel Setting Pacing Amplitude: 2.5 V
Lead Channel Setting Pacing Pulse Width: 0.4 ms
Lead Channel Setting Sensing Sensitivity: 4 mV

## 2020-06-10 NOTE — Progress Notes (Signed)
Remote pacemaker transmission.   

## 2020-09-06 ENCOUNTER — Telehealth: Payer: Self-pay

## 2020-09-06 NOTE — Telephone Encounter (Signed)
The pt wants to go ahead and schedule his yearly appointment with Dr. Graciela Husbands. He would like for the scheduler to call him tomorrow morning to schedule the appointment.

## 2020-09-07 ENCOUNTER — Ambulatory Visit (INDEPENDENT_AMBULATORY_CARE_PROVIDER_SITE_OTHER): Payer: Federal, State, Local not specified - PPO

## 2020-09-07 DIAGNOSIS — I441 Atrioventricular block, second degree: Secondary | ICD-10-CM

## 2020-09-08 LAB — CUP PACEART REMOTE DEVICE CHECK
Battery Remaining Longevity: 59 mo
Battery Voltage: 3 V
Brady Statistic AP VP Percent: 30.86 %
Brady Statistic AP VS Percent: 0 %
Brady Statistic AS VP Percent: 69.13 %
Brady Statistic AS VS Percent: 0.01 %
Brady Statistic RA Percent Paced: 30.79 %
Brady Statistic RV Percent Paced: 99.98 %
Date Time Interrogation Session: 20211006171346
Implantable Lead Implant Date: 20160715
Implantable Lead Implant Date: 20171205
Implantable Lead Location: 753859
Implantable Lead Location: 753860
Implantable Lead Model: 5076
Implantable Lead Model: 5076
Implantable Pulse Generator Implant Date: 20171205
Lead Channel Impedance Value: 323 Ohm
Lead Channel Impedance Value: 437 Ohm
Lead Channel Impedance Value: 456 Ohm
Lead Channel Impedance Value: 513 Ohm
Lead Channel Pacing Threshold Amplitude: 0.25 V
Lead Channel Pacing Threshold Amplitude: 0.625 V
Lead Channel Pacing Threshold Pulse Width: 0.4 ms
Lead Channel Pacing Threshold Pulse Width: 0.4 ms
Lead Channel Sensing Intrinsic Amplitude: 12.125 mV
Lead Channel Sensing Intrinsic Amplitude: 12.125 mV
Lead Channel Sensing Intrinsic Amplitude: 3.25 mV
Lead Channel Sensing Intrinsic Amplitude: 3.25 mV
Lead Channel Setting Pacing Amplitude: 1.5 V
Lead Channel Setting Pacing Amplitude: 2.5 V
Lead Channel Setting Pacing Pulse Width: 0.4 ms
Lead Channel Setting Sensing Sensitivity: 4 mV

## 2020-09-12 NOTE — Progress Notes (Signed)
Remote pacemaker transmission.   

## 2020-12-14 DIAGNOSIS — I472 Ventricular tachycardia: Secondary | ICD-10-CM | POA: Insufficient documentation

## 2020-12-14 DIAGNOSIS — I4729 Other ventricular tachycardia: Secondary | ICD-10-CM | POA: Insufficient documentation

## 2020-12-16 ENCOUNTER — Ambulatory Visit: Payer: Federal, State, Local not specified - PPO | Admitting: Internal Medicine

## 2020-12-16 ENCOUNTER — Other Ambulatory Visit: Payer: Self-pay

## 2020-12-16 ENCOUNTER — Encounter: Payer: Self-pay | Admitting: Internal Medicine

## 2020-12-16 VITALS — BP 138/88 | HR 99 | Ht 69.0 in | Wt 196.8 lb

## 2020-12-16 DIAGNOSIS — Z950 Presence of cardiac pacemaker: Secondary | ICD-10-CM | POA: Diagnosis not present

## 2020-12-16 DIAGNOSIS — I471 Supraventricular tachycardia: Secondary | ICD-10-CM | POA: Diagnosis not present

## 2020-12-16 DIAGNOSIS — I472 Ventricular tachycardia: Secondary | ICD-10-CM

## 2020-12-16 DIAGNOSIS — I442 Atrioventricular block, complete: Secondary | ICD-10-CM | POA: Diagnosis not present

## 2020-12-16 DIAGNOSIS — I4729 Other ventricular tachycardia: Secondary | ICD-10-CM

## 2020-12-16 NOTE — Patient Instructions (Addendum)
Medication Instructions:  Your physician recommends that you continue on your current medications as directed. Please refer to the Current Medication list given to you today.  *If you need a refill on your cardiac medications before your next appointment, please call your pharmacy*   Lab Work: None ordered.  If you have labs (blood work) drawn today and your tests are completely normal, you will receive your results only by: Marland Kitchen MyChart Message (if you have MyChart) OR . A paper copy in the mail If you have any lab test that is abnormal or we need to change your treatment, we will call you to review the results.   Testing/Procedures: None ordered.    Follow-Up: At [facility], you and your health needs are our priority.  As part of our continuing mission to provide you with exceptional heart care, we have created designated Provider Care Teams.  These Care Teams include your primary Cardiologist (physician) and Advanced Practice Providers (APPs -  Physician Assistants and Nurse Practitioners) who all work together to provide you with the care you need, when you need it.  We recommend signing up for the patient portal called "MyChart".  Sign up information is provided on this After Visit Summary.  MyChart is used to connect with patients for Virtual Visits (Telemedicine).  Patients are able to view lab/test results, encounter notes, upcoming appointments, etc.  Non-urgent messages can be sent to your provider as well.   To learn more about what you can do with MyChart, go to [email or web address].    Your next appointment:   12 month(s)  The format for your next appointment:   In Person  Provider:   Sherryl Manges, MD   Pt states he does not want appointment recall.  He will remember to schedule at the appropriate time.

## 2020-12-16 NOTE — Progress Notes (Signed)
Patient Care Team: Elias Else, MD as PCP - General (Family Medicine)   HPI  Jeremy Abbott is a 75 y.o. male Seen in followup for PM implanted 7/16 for CHB Medtronic complicated by high right ventricular pacing thresholds associated with syncope; he underwent extraction and new lead implantation (GT) 12/17   DATE TEST EF   7/16 Echo   65 %   1/19 Echo   55-60 %   1/19 Myoview 47% No ischemia    Date Cr K Hgb  1/19 0.77 4.6     4/20 0.71 3.9 14.6   The patient denies chest pain, shortness of breath, nocturnal dyspnea, orthopnea, longstanding peripheral edema dependent.  There have been no palpitations, lightheadedness or syncope.    Hemoglobin A1c is down to about 5.2   Hx of HTN, DM    Past Medical History:  Diagnosis Date  . ADHD (attention deficit hyperactivity disorder)   . Adjustment disorder with anxiety   . Allergic rhinitis   . Anxiety   . Complete heart block (HCC)    a. s/p Medtronic PPM 06/17/15 (device dependent).  . Depression   . Diabetes mellitus (HCC)   . Essential hypertension   . Fracture of right clavicle 11/09/2009   Chronic right collarbone pain-non union  . Headache   . Heart murmur    at one time, not significant per pt  . Hyperlipidemia   . Hypokalemia   . NSAID long-term use   . Presence of permanent cardiac pacemaker   . RECTAL BLEEDING 01/22/2008    Past Surgical History:  Procedure Laterality Date  . COLONOSCOPY    . EP IMPLANTABLE DEVICE N/A 06/17/2015   Procedure: Pacemaker Implant;  Surgeon: Duke Salvia, MD;  Location: [facility];  Service: Cardiovascular;  Laterality: N/A;  . EP IMPLANTABLE DEVICE Right 11/06/2016   Procedure: INSERTION of new right ventricle pacemaker lead  and insertion of new pacemaker;  Surgeon: Marinus Maw, MD;  Location: [facility] OR;  Service: Cardiovascular;  Laterality: Right;  . ORIF CLAVICLE FRACTURE  2/08   s/p collarbone fx with Fall-Murphy Wainer  . PACEMAKER LEAD REMOVAL Left  11/06/2016   Procedure: PACEMAKER LEAD EXTRACTION;  Surgeon: Marinus Maw, MD;  Location: [facility] OR;  Service: Cardiovascular;  Laterality: Left;  DR. Tyrone Sage TO BACKUP CASE  . TEE WITHOUT CARDIOVERSION N/A 11/06/2016   Procedure: TRANSESOPHAGEAL ECHOCARDIOGRAM (TEE);  Surgeon: Marinus Maw, MD;  Location: [facility] OR;  Service: Cardiovascular;  Laterality: N/A;  . TONSILLECTOMY      Current Outpatient Medications  Medication Sig Dispense Refill  . amphetamine-dextroamphetamine (ADDERALL XR) 25 MG 24 hr capsule Take 25 mg by mouth every morning.    Marland Kitchen aspirin-acetaminophen-caffeine (EXCEDRIN MIGRAINE) 250-250-65 MG per tablet Take 2 tablets by mouth every 6 (six) hours as needed for headache.    . glucose blood (ONE TOUCH ULTRA TEST) test strip Once daily dx 250.00 100 each 11  . HYDROcodone-acetaminophen (NORCO) 7.5-325 MG tablet Take 1 tablet by mouth daily as needed (pain).     Marland Kitchen ibuprofen (ADVIL,MOTRIN) 200 MG tablet Take 600 mg by mouth every 8 (eight) hours as needed for moderate pain.    Marland Kitchen lisinopril (PRINIVIL,ZESTRIL) 20 MG tablet Take 20 mg by mouth daily.    . metFORMIN (GLUCOPHAGE-XR) 500 MG 24 hr tablet Take 1,000 mg by mouth every morning.     . Multiple Vitamins-Minerals (ZINC PO) Take 1 tablet by mouth daily.    Marland Kitchen  ONETOUCH DELICA LANCETS MISC Once daily dx 250.00 Once daily dx 250.00 100 each 11  . sildenafil (VIAGRA) 100 MG tablet Take 100 mg by mouth daily as needed for erectile dysfunction.    . simvastatin (ZOCOR) 40 MG tablet Take 1 tablet (40 mg total) by mouth at bedtime. 90 tablet 3  . tamsulosin (FLOMAX) 0.4 MG CAPS capsule Take 0.4 mg by mouth daily.     No current facility-administered medications for this visit.    Allergies  Allergen Reactions  . No Known Allergies       Review of Systems negative except from HPI and PMH  Physical Exam BP 138/88   Pulse 99   Ht 5\' 9"  (1.753 m)   Wt 196 lb 12.8 oz (89.3 kg)   SpO2 98%   BMI 29.06 kg/m  Well developed and  well nourished in no acute distress HENT normal Neck supple with JVP-flat Clear Device pocket well healed; without hematoma or erythema.  There is no tethering  Regular rate and rhythm,2/6 murmur Abd-soft with active BS No Clubbing cyanosis 2+ edema Skin-warm and dry A & Oriented  Grossly normal sensory and motor function  ECG    Assessment and  Plan  Complete heart block  Pacemaker Medtronic   S/p revision of RV lead   Hypertension   Atrial tach  Sinus tach   VT NS    HR is elevated; however, trends suggest that it is reasonably controlled in the 80s Longstanding edema.  Reviewed his diet which is salt deplete.  Somewhat replete of fluid.  Discussed the importance of monitoring for worsening edema and the development of cellulitis secondary to potential risk of device infection  Uses chronic aspirin for headaches.  Denies symptoms to suggest sleep apnea.  Will continue to follow LV function and will need an echo at his next visit.  All the Myoview EF was low at the last evaluation it was concurrent with an echocardiogram that showed a much higher ejection fraction.  In the absence of symptoms we will follow

## 2020-12-20 LAB — CUP PACEART INCLINIC DEVICE CHECK
Battery Remaining Longevity: 58 mo
Battery Voltage: 2.99 V
Brady Statistic AP VP Percent: 26.42 %
Brady Statistic AP VS Percent: 0 %
Brady Statistic AS VP Percent: 73.57 %
Brady Statistic AS VS Percent: 0.01 %
Brady Statistic RA Percent Paced: 26.38 %
Brady Statistic RV Percent Paced: 99.98 %
Date Time Interrogation Session: 20220114175200
Implantable Lead Implant Date: 20160715
Implantable Lead Implant Date: 20171205
Implantable Lead Location: 753859
Implantable Lead Location: 753860
Implantable Lead Model: 5076
Implantable Lead Model: 5076
Implantable Pulse Generator Implant Date: 20171205
Lead Channel Impedance Value: 342 Ohm
Lead Channel Impedance Value: 418 Ohm
Lead Channel Impedance Value: 532 Ohm
Lead Channel Impedance Value: 589 Ohm
Lead Channel Pacing Threshold Amplitude: 0.25 V
Lead Channel Pacing Threshold Amplitude: 0.75 V
Lead Channel Pacing Threshold Pulse Width: 0.4 ms
Lead Channel Pacing Threshold Pulse Width: 0.4 ms
Lead Channel Sensing Intrinsic Amplitude: 17.25 mV
Lead Channel Sensing Intrinsic Amplitude: 3.25 mV
Lead Channel Setting Pacing Amplitude: 1.5 V
Lead Channel Setting Pacing Amplitude: 2.5 V
Lead Channel Setting Pacing Pulse Width: 0.4 ms
Lead Channel Setting Sensing Sensitivity: 4 mV

## 2021-03-07 ENCOUNTER — Ambulatory Visit (INDEPENDENT_AMBULATORY_CARE_PROVIDER_SITE_OTHER): Payer: Federal, State, Local not specified - PPO

## 2021-03-07 DIAGNOSIS — I441 Atrioventricular block, second degree: Secondary | ICD-10-CM | POA: Diagnosis not present

## 2021-03-07 LAB — CUP PACEART REMOTE DEVICE CHECK
Battery Remaining Longevity: 51 mo
Battery Voltage: 2.99 V
Brady Statistic AP VP Percent: 17.48 %
Brady Statistic AP VS Percent: 0 %
Brady Statistic AS VP Percent: 82.39 %
Brady Statistic AS VS Percent: 0.13 %
Brady Statistic RA Percent Paced: 17.45 %
Brady Statistic RV Percent Paced: 99.8 %
Date Time Interrogation Session: 20220405101255
Implantable Lead Implant Date: 20160715
Implantable Lead Implant Date: 20171205
Implantable Lead Location: 753859
Implantable Lead Location: 753860
Implantable Lead Model: 5076
Implantable Lead Model: 5076
Implantable Pulse Generator Implant Date: 20171205
Lead Channel Impedance Value: 323 Ohm
Lead Channel Impedance Value: 437 Ohm
Lead Channel Impedance Value: 475 Ohm
Lead Channel Impedance Value: 532 Ohm
Lead Channel Pacing Threshold Amplitude: 0.25 V
Lead Channel Pacing Threshold Amplitude: 0.75 V
Lead Channel Pacing Threshold Pulse Width: 0.4 ms
Lead Channel Pacing Threshold Pulse Width: 0.4 ms
Lead Channel Sensing Intrinsic Amplitude: 22.625 mV
Lead Channel Sensing Intrinsic Amplitude: 22.625 mV
Lead Channel Sensing Intrinsic Amplitude: 3 mV
Lead Channel Sensing Intrinsic Amplitude: 3 mV
Lead Channel Setting Pacing Amplitude: 1.5 V
Lead Channel Setting Pacing Amplitude: 2.5 V
Lead Channel Setting Pacing Pulse Width: 0.4 ms
Lead Channel Setting Sensing Sensitivity: 4 mV

## 2021-03-17 NOTE — Progress Notes (Signed)
Remote pacemaker transmission.   

## 2021-06-06 ENCOUNTER — Ambulatory Visit (INDEPENDENT_AMBULATORY_CARE_PROVIDER_SITE_OTHER): Payer: Federal, State, Local not specified - PPO

## 2021-06-06 DIAGNOSIS — I441 Atrioventricular block, second degree: Secondary | ICD-10-CM | POA: Diagnosis not present

## 2021-06-07 LAB — CUP PACEART REMOTE DEVICE CHECK
Battery Remaining Longevity: 52 mo
Battery Voltage: 2.99 V
Brady Statistic AP VP Percent: 19.56 %
Brady Statistic AP VS Percent: 0 %
Brady Statistic AS VP Percent: 80.38 %
Brady Statistic AS VS Percent: 0.06 %
Brady Statistic RA Percent Paced: 19.55 %
Brady Statistic RV Percent Paced: 99.93 %
Date Time Interrogation Session: 20220705180408
Implantable Lead Implant Date: 20160715
Implantable Lead Implant Date: 20171205
Implantable Lead Location: 753859
Implantable Lead Location: 753860
Implantable Lead Model: 5076
Implantable Lead Model: 5076
Implantable Pulse Generator Implant Date: 20171205
Lead Channel Impedance Value: 323 Ohm
Lead Channel Impedance Value: 437 Ohm
Lead Channel Impedance Value: 437 Ohm
Lead Channel Impedance Value: 494 Ohm
Lead Channel Pacing Threshold Amplitude: 0.25 V
Lead Channel Pacing Threshold Amplitude: 0.625 V
Lead Channel Pacing Threshold Pulse Width: 0.4 ms
Lead Channel Pacing Threshold Pulse Width: 0.4 ms
Lead Channel Sensing Intrinsic Amplitude: 10.5 mV
Lead Channel Sensing Intrinsic Amplitude: 10.5 mV
Lead Channel Sensing Intrinsic Amplitude: 3.25 mV
Lead Channel Sensing Intrinsic Amplitude: 3.25 mV
Lead Channel Setting Pacing Amplitude: 1.5 V
Lead Channel Setting Pacing Amplitude: 2.5 V
Lead Channel Setting Pacing Pulse Width: 0.4 ms
Lead Channel Setting Sensing Sensitivity: 4 mV

## 2021-06-23 NOTE — Progress Notes (Signed)
Remote pacemaker transmission.   

## 2021-06-27 ENCOUNTER — Encounter (INDEPENDENT_AMBULATORY_CARE_PROVIDER_SITE_OTHER): Payer: Self-pay | Admitting: Ophthalmology

## 2021-06-27 ENCOUNTER — Ambulatory Visit (INDEPENDENT_AMBULATORY_CARE_PROVIDER_SITE_OTHER): Payer: Federal, State, Local not specified - PPO | Admitting: Ophthalmology

## 2021-06-27 ENCOUNTER — Other Ambulatory Visit: Payer: Self-pay

## 2021-06-27 DIAGNOSIS — H43813 Vitreous degeneration, bilateral: Secondary | ICD-10-CM | POA: Diagnosis not present

## 2021-06-27 DIAGNOSIS — H2513 Age-related nuclear cataract, bilateral: Secondary | ICD-10-CM | POA: Diagnosis not present

## 2021-06-27 DIAGNOSIS — E119 Type 2 diabetes mellitus without complications: Secondary | ICD-10-CM | POA: Diagnosis not present

## 2021-06-27 NOTE — Assessment & Plan Note (Signed)

## 2021-06-27 NOTE — Assessment & Plan Note (Signed)

## 2021-06-27 NOTE — Progress Notes (Signed)
06/27/2021     CHIEF COMPLAINT Patient presents for Retina Follow Up (3 year f/u last seen in office on 03/11/2018/Pt states VA OU stable since last visit. Pt denies FOL, floaters, or ocular pain OU. Erroll Luna: 5.4/LBS: Does not check/)   HISTORY OF PRESENT ILLNESS: Jeremy Abbott is a 75 y.o. male who presents to the clinic today for:   HPI     Retina Follow Up           Diagnosis: Other   Laterality: both eyes   Onset: 3 years ago   Severity: mild   Duration: 3 years   Course: stable   Comments: 3 year f/u last seen in office on 03/11/2018 Pt states VA OU stable since last visit. Pt denies FOL, floaters, or ocular pain OU.  A1C: 5.4 LBS: Does not check        Last edited by Demetrios Loll, COA on 06/27/2021  3:20 PM.      Referring physician: Elias Else, MD [phone] [address],  [place] [address]  HISTORICAL INFORMATION:   Selected notes from the MEDICAL RECORD NUMBER    Lab Results  Component Value Date   HGBA1C 6.1 11/10/2012     CURRENT MEDICATIONS: No current outpatient medications on file. (Ophthalmic Drugs)   No current facility-administered medications for this visit. (Ophthalmic Drugs)   Current Outpatient Medications (Other)  Medication Sig   amphetamine-dextroamphetamine (ADDERALL XR) 25 MG 24 hr capsule Take 25 mg by mouth every morning.   aspirin-acetaminophen-caffeine (EXCEDRIN MIGRAINE) 250-250-65 MG per tablet Take 2 tablets by mouth every 6 (six) hours as needed for headache.   glucose blood (ONE TOUCH ULTRA TEST) test strip Once daily dx 250.00   HYDROcodone-acetaminophen (NORCO) 7.5-325 MG tablet Take 1 tablet by mouth daily as needed (pain).    ibuprofen (ADVIL,MOTRIN) 200 MG tablet Take 600 mg by mouth every 8 (eight) hours as needed for moderate pain.   lisinopril (PRINIVIL,ZESTRIL) 20 MG tablet Take 20 mg by mouth daily.   metFORMIN (GLUCOPHAGE-XR) 500 MG 24 hr tablet Take 1,000 mg by mouth every morning.    Multiple  Vitamins-Minerals (ZINC PO) Take 1 tablet by mouth daily.   ONETOUCH DELICA LANCETS MISC Once daily dx 250.00 Once daily dx 250.00   sildenafil (VIAGRA) 100 MG tablet Take 100 mg by mouth daily as needed for erectile dysfunction.   simvastatin (ZOCOR) 40 MG tablet Take 1 tablet (40 mg total) by mouth at bedtime.   tamsulosin (FLOMAX) 0.4 MG CAPS capsule Take 0.4 mg by mouth daily.   No current facility-administered medications for this visit. (Other)      REVIEW OF SYSTEMS:    ALLERGIES Allergies  Allergen Reactions   No Known Allergies     PAST MEDICAL HISTORY Past Medical History:  Diagnosis Date   ADHD (attention deficit hyperactivity disorder)    Adjustment disorder with anxiety    Allergic rhinitis    Anxiety    Complete heart block (HCC)    a. s/p Medtronic PPM 06/17/15 (device dependent).   Depression    Diabetes mellitus (HCC)    Essential hypertension    Fracture of right clavicle 11/09/2009   Chronic right collarbone pain-non union   Headache    Heart murmur    at one time, not significant per pt   Hyperlipidemia    Hypokalemia    NSAID long-term use    Presence of permanent cardiac pacemaker    RECTAL BLEEDING 01/22/2008  Past Surgical History:  Procedure Laterality Date   COLONOSCOPY     EP IMPLANTABLE DEVICE N/A 06/17/2015   Procedure: Pacemaker Implant;  Surgeon: Duke Salvia, MD;  Location: [facility] INVASIVE CV LAB;  Service: Cardiovascular;  Laterality: N/A;   EP IMPLANTABLE DEVICE Right 11/06/2016   Procedure: INSERTION of new right ventricle pacemaker lead  and insertion of new pacemaker;  Surgeon: Marinus Maw, MD;  Location: [facility] OR;  Service: Cardiovascular;  Laterality: Right;   ORIF CLAVICLE FRACTURE  2/08   s/p collarbone fx with Fall-Murphy Wainer   PACEMAKER LEAD REMOVAL Left 11/06/2016   Procedure: PACEMAKER LEAD EXTRACTION;  Surgeon: Marinus Maw, MD;  Location: [facility] OR;  Service: Cardiovascular;  Laterality: Left;  DR. Tyrone Sage TO BACKUP CASE    TEE WITHOUT CARDIOVERSION N/A 11/06/2016   Procedure: TRANSESOPHAGEAL ECHOCARDIOGRAM (TEE);  Surgeon: Marinus Maw, MD;  Location: [facility] OR;  Service: Cardiovascular;  Laterality: N/A;   TONSILLECTOMY      FAMILY HISTORY Family History  Problem Relation Age of Onset   Heart failure Father    Diabetes Daughter    Heart attack Neg Hx    Stroke Neg Hx     SOCIAL HISTORY Social History   Tobacco Use   Smoking status: Former   Smokeless tobacco: Never  Substance Use Topics   Alcohol use: No   Drug use: No         OPHTHALMIC EXAM:  Base Eye Exam     Visual Acuity (ETDRS)       Right Left   Dist cc 20/20 20/20    Correction: Glasses         Tonometry (Tonopen, 3:23 PM)       Right Left   Pressure 14 15         Pupils       Pupils Dark Light Shape React APD   Right PERRL 4 3 Round Brisk None   Left PERRL 4 3 Round Brisk None         Visual Fields (Counting fingers)       Left Right    Full Full         Extraocular Movement       Right Left    Full Full         Neuro/Psych     Oriented x3: Yes         Dilation     Both eyes: 1.0% Mydriacyl, 2.5% Phenylephrine @ 3:23 PM           Slit Lamp and Fundus Exam     External Exam       Right Left   External Normal Normal         Slit Lamp Exam       Right Left   Lids/Lashes Normal Normal   Conjunctiva/Sclera White and quiet White and quiet   Cornea Clear Clear   Anterior Chamber Deep and quiet Deep and quiet   Iris Round and reactive Round and reactive   Lens 2+ Nuclear sclerosis 2+ Nuclear sclerosis   Anterior Vitreous Normal Normal         Fundus Exam       Right Left   Posterior Vitreous Posterior vitreous detachment Posterior vitreous detachment   Disc Normal Normal   C/D Ratio 0.35 0.35   Macula Normal Normal   Vessels Normal, no DR Normal, no DR   Periphery Normal Normal  IMAGING AND PROCEDURES  Imaging and Procedures for  06/27/21  OCT, Retina - OU - Both Eyes       Right Eye Quality was good. Scan locations included subfoveal. Central Foveal Thickness: 277. Progression has been stable. Findings include normal foveal contour.   Left Eye Quality was good. Scan locations included subfoveal. Central Foveal Thickness: 277. Progression has been stable. Findings include normal foveal contour.              ASSESSMENT/PLAN:  DM2 (diabetes mellitus, type 2) The patient has diabetes without any evidence of retinopathy. The patient advised to maintain good blood glucose control, excellent blood pressure control, and favorable levels of cholesterol, low density lipoprotein, and high density lipoproteins. Follow up in 1 year was recommended. Explained that fluctuations in visual acuity , or "out of focus", may result from large variations of blood sugar control.  Nuclear sclerotic cataract of both eyes The nature of cataract was discussed with the patient as well as the elective nature of surgery. The patient was reassured that surgery at a later date does not put the patient at risk for a worse outcome. It was emphasized that the need for surgery is dictated by the patient's quality of life as influenced by the cataract. Patient was instructed to maintain close follow up with their general eye care doctor.      ICD-10-CM   1. Posterior vitreous detachment of both eyes  H43.813 OCT, Retina - OU - Both Eyes    2. Nuclear sclerotic cataract of both eyes  H25.13     3. Type 2 diabetes mellitus without complication, without long-term current use of insulin (HCC)  E11.9       1.  No diabetic retinopathy in either eye  2.  Mild nuclear sclerotic cataract with color changes.  Patient is having early symptoms of needing brighter lights to read for instance.  3.  I explained to the patient and his age group that cataract extraction intraocular lens placement will obviate and decrease and/or stop the need for  distance glasses.   I have suggested general ophthalmology evaluation and discussion of these matters Ophthalmic Meds Ordered this visit:  No orders of the defined types were placed in this encounter.      Return in about 2 years (around 06/28/2023) for DILATE OU, OCT.  There are no Patient Instructions on file for this visit.   Explained the diagnoses, plan, and follow up with the patient and they expressed understanding.  Patient expressed understanding of the importance of proper follow up care.   Alford HighlandGary A. Ravyn Nikkel M.D. Diseases & Surgery of the Retina and Vitreous [facility] 06/27/21     Abbreviations: M myopia (nearsighted); A astigmatism; H hyperopia (farsighted); P presbyopia; Mrx spectacle prescription;  CTL contact lenses; OD right eye; OS left eye; OU both eyes  XT exotropia; ET esotropia; PEK punctate epithelial keratitis; PEE punctate epithelial erosions; DES dry eye syndrome; MGD meibomian gland dysfunction; ATs artificial tears; PFAT's preservative free artificial tears; NSC nuclear sclerotic cataract; PSC posterior subcapsular cataract; ERM epi-retinal membrane; PVD posterior vitreous detachment; RD retinal detachment; DM diabetes mellitus; DR diabetic retinopathy; NPDR non-proliferative diabetic retinopathy; PDR proliferative diabetic retinopathy; CSME clinically significant macular edema; DME diabetic macular edema; dbh dot blot hemorrhages; CWS cotton wool spot; POAG primary open angle glaucoma; C/D cup-to-disc ratio; HVF humphrey visual field; GVF goldmann visual field; OCT optical coherence tomography; IOP intraocular pressure; BRVO Branch retinal vein occlusion; CRVO central retinal  vein occlusion; CRAO central retinal artery occlusion; BRAO branch retinal artery occlusion; RT retinal tear; SB scleral buckle; PPV pars plana vitrectomy; VH Vitreous hemorrhage; PRP panretinal laser photocoagulation; IVK intravitreal kenalog; VMT vitreomacular traction; MH  Macular hole;  NVD neovascularization of the disc; NVE neovascularization elsewhere; AREDS age related eye disease study; ARMD age related macular degeneration; POAG primary open angle glaucoma; EBMD epithelial/anterior basement membrane dystrophy; ACIOL anterior chamber intraocular lens; IOL intraocular lens; PCIOL posterior chamber intraocular lens; Phaco/IOL phacoemulsification with intraocular lens placement; PRK photorefractive keratectomy; LASIK laser assisted in situ keratomileusis; HTN hypertension; DM diabetes mellitus; COPD chronic obstructive pulmonary disease

## 2021-09-05 ENCOUNTER — Ambulatory Visit (INDEPENDENT_AMBULATORY_CARE_PROVIDER_SITE_OTHER): Payer: Federal, State, Local not specified - PPO

## 2021-09-05 DIAGNOSIS — I441 Atrioventricular block, second degree: Secondary | ICD-10-CM

## 2021-09-11 LAB — CUP PACEART REMOTE DEVICE CHECK
Battery Remaining Longevity: 45 mo
Battery Voltage: 2.98 V
Brady Statistic AP VP Percent: 26.17 %
Brady Statistic AP VS Percent: 0 %
Brady Statistic AS VP Percent: 73.79 %
Brady Statistic AS VS Percent: 0.04 %
Brady Statistic RA Percent Paced: 26.1 %
Brady Statistic RV Percent Paced: 99.94 %
Date Time Interrogation Session: 20221007132131
Implantable Lead Implant Date: 20160715
Implantable Lead Implant Date: 20171205
Implantable Lead Location: 753859
Implantable Lead Location: 753860
Implantable Lead Model: 5076
Implantable Lead Model: 5076
Implantable Pulse Generator Implant Date: 20171205
Lead Channel Impedance Value: 323 Ohm
Lead Channel Impedance Value: 456 Ohm
Lead Channel Impedance Value: 475 Ohm
Lead Channel Impedance Value: 532 Ohm
Lead Channel Pacing Threshold Amplitude: 0.25 V
Lead Channel Pacing Threshold Amplitude: 0.75 V
Lead Channel Pacing Threshold Pulse Width: 0.4 ms
Lead Channel Pacing Threshold Pulse Width: 0.4 ms
Lead Channel Sensing Intrinsic Amplitude: 10.875 mV
Lead Channel Sensing Intrinsic Amplitude: 10.875 mV
Lead Channel Sensing Intrinsic Amplitude: 3.25 mV
Lead Channel Sensing Intrinsic Amplitude: 3.25 mV
Lead Channel Setting Pacing Amplitude: 1.5 V
Lead Channel Setting Pacing Amplitude: 2.5 V
Lead Channel Setting Pacing Pulse Width: 0.4 ms
Lead Channel Setting Sensing Sensitivity: 4 mV

## 2021-09-13 NOTE — Progress Notes (Signed)
Remote pacemaker transmission.   

## 2021-10-03 ENCOUNTER — Encounter (INDEPENDENT_AMBULATORY_CARE_PROVIDER_SITE_OTHER): Payer: Self-pay

## 2021-12-05 ENCOUNTER — Encounter: Payer: Self-pay | Admitting: Internal Medicine

## 2021-12-05 ENCOUNTER — Ambulatory Visit: Payer: Federal, State, Local not specified - PPO | Admitting: Internal Medicine

## 2021-12-05 ENCOUNTER — Other Ambulatory Visit: Payer: Self-pay

## 2021-12-05 VITALS — BP 152/80 | HR 87 | Ht 69.0 in | Wt 210.4 lb

## 2021-12-05 DIAGNOSIS — I471 Supraventricular tachycardia: Secondary | ICD-10-CM

## 2021-12-05 DIAGNOSIS — I4719 Other supraventricular tachycardia: Secondary | ICD-10-CM

## 2021-12-05 DIAGNOSIS — I4729 Other ventricular tachycardia: Secondary | ICD-10-CM | POA: Diagnosis not present

## 2021-12-05 DIAGNOSIS — I442 Atrioventricular block, complete: Secondary | ICD-10-CM | POA: Diagnosis not present

## 2021-12-05 DIAGNOSIS — Z950 Presence of cardiac pacemaker: Secondary | ICD-10-CM | POA: Diagnosis not present

## 2021-12-05 DIAGNOSIS — I429 Cardiomyopathy, unspecified: Secondary | ICD-10-CM

## 2021-12-05 NOTE — Progress Notes (Signed)
Patient Care Team: Elias Else, MD as PCP - General (Family Medicine)   HPI  Jeremy Abbott is a 76 y.o. male is seen in followup for PM implanted 7/16 for CHB Medtronic complicated by high right ventricular pacing thresholds associated with syncope; he underwent extraction and new lead implantation (GT) 12/17  Hx of hypertension, diabetes mellitus, heart murmur, hypokalemia, hyperlipidemia, depression, and anxiety.   Today, he appears well and he states he is able to climb a flight of stairs without experiencing SOB. He later says he can experience shortness of breath at night if he does not use his silicone nose inserts and humidifier.   The patient denies chest pain, nocturnal dyspnea, orthopnea.  There have been no lightheadedness or syncope.   He says at the end of a long day his legs would swell slightly. He says in the mornings he does not experience swelling.   The last time he checked his blood pressure at home is around 128/78. He states that at another clinic checked his systolic pressure, and it was around 158 or 130 twenty minutes later.   He had five COVID shots, and is asymptomatic at this time.     DATE TEST EF    7/16 Echo   65 %    1/19 Echo   55-60 %    1/19 Myoview 47% No ischemia               Date Cr K Hgb  1/19 0.77 4.6     4/20 0.71 3.9 14.6  9/22 0.66 3.7 13.8              Past Medical History:  Diagnosis Date   ADHD (attention deficit hyperactivity disorder)    Adjustment disorder with anxiety    Allergic rhinitis    Anxiety    Complete heart block (HCC)    a. s/p Medtronic PPM 06/17/15 (device dependent).   Depression    Diabetes mellitus (HCC)    Essential hypertension    Fracture of right clavicle 11/09/2009   Chronic right collarbone pain-non union   Headache    Heart murmur    at one time, not significant per pt   Hyperlipidemia    Hypokalemia    NSAID long-term use    Presence of permanent cardiac pacemaker     RECTAL BLEEDING 01/22/2008    Past Surgical History:  Procedure Laterality Date   COLONOSCOPY     EP IMPLANTABLE DEVICE N/A 06/17/2015   Procedure: Pacemaker Implant;  Surgeon: Duke Salvia, MD;  Location: [facility] INVASIVE CV LAB;  Service: Cardiovascular;  Laterality: N/A;   EP IMPLANTABLE DEVICE Right 11/06/2016   Procedure: INSERTION of new right ventricle pacemaker lead  and insertion of new pacemaker;  Surgeon: Marinus Maw, MD;  Location: [facility] OR;  Service: Cardiovascular;  Laterality: Right;   ORIF CLAVICLE FRACTURE  2/08   s/p collarbone fx with Fall-Murphy Wainer   PACEMAKER LEAD REMOVAL Left 11/06/2016   Procedure: PACEMAKER LEAD EXTRACTION;  Surgeon: Marinus Maw, MD;  Location: [facility] OR;  Service: Cardiovascular;  Laterality: Left;  DR. Tyrone Sage TO BACKUP CASE   TEE WITHOUT CARDIOVERSION N/A 11/06/2016   Procedure: TRANSESOPHAGEAL ECHOCARDIOGRAM (TEE);  Surgeon: Marinus Maw, MD;  Location: [facility] OR;  Service: Cardiovascular;  Laterality: N/A;   TONSILLECTOMY      Current Outpatient Medications  Medication Sig Dispense Refill   amphetamine-dextroamphetamine (ADDERALL XR) 25 MG 24 hr capsule Take 25 mg  by mouth every morning.     aspirin-acetaminophen-caffeine (EXCEDRIN MIGRAINE) 250-250-65 MG per tablet Take 2 tablets by mouth every 6 (six) hours as needed for headache.     HYDROcodone-acetaminophen (NORCO) 7.5-325 MG tablet Take 1 tablet by mouth daily as needed (pain).      ibuprofen (ADVIL,MOTRIN) 200 MG tablet Take 600 mg by mouth every 8 (eight) hours as needed for moderate pain.     lisinopril (PRINIVIL,ZESTRIL) 20 MG tablet Take 20 mg by mouth daily.     metFORMIN (GLUCOPHAGE-XR) 500 MG 24 hr tablet Take 1,000 mg by mouth every morning.      Multiple Vitamins-Minerals (ZINC PO) Take 1 tablet by mouth daily.     sildenafil (VIAGRA) 100 MG tablet Take 100 mg by mouth daily as needed for erectile dysfunction.     simvastatin (ZOCOR) 40 MG tablet Take 1 tablet (40 mg total) by mouth  at bedtime. 90 tablet 3   tamsulosin (FLOMAX) 0.4 MG CAPS capsule Take 0.4 mg by mouth daily.     glucose blood (ONE TOUCH ULTRA TEST) test strip Once daily dx 250.00 100 each 11   ONETOUCH DELICA LANCETS MISC Once daily dx 250.00 Once daily dx 250.00 100 each 11   No current facility-administered medications for this visit.    Allergies  Allergen Reactions   Amitriptyline Hcl Other (See Comments)    Sleeping for long periods of time   No Known Allergies       Review of Systems negative except from HPI and PMH Please see the history of present illness. (+) Shortness of Breath (+) LE Edema All other systems are reviewed and negative.     Physical Exam BP (!) 152/80    Pulse 87    Ht 5\' 9"  (1.753 m)    Wt 210 lb 6.4 oz (95.4 kg)    SpO2 98%    BMI 31.07 kg/m  Well developed and well nourished in no acute distress HENT normal Neck supple with JVP-flat Clear Device pocket well healed; without hematoma or erythema.  There is no tethering  Regular rate and rhythm, no  gallop No murmur Abd-soft with active BS No Clubbing cyanosis 1+edema Skin-warm and dry A & Oriented  Grossly normal sensory and motor function    12/05/2021 ECG: P-synchronous/ AV  pacing    Assessment and  Plan  Complete heart block  Pacemaker Medtronic   S/p revision of RV lead   Hypertension   Atrial tach  Sinus tach   VT NS    No clear limitations of function, but with the incidence of  3% /yr  for patients device dependent would echo   Will continue his lisinopril at 20 mg. .  He says at home his blood pressures are in the 120/70 range.  Also be used for renal protection in the context of his diabetes.  Long discussion regarding prognostic implications of heart disease and his pacemaker     I,Tinashe Williams,acting as a scribe for 02/02/2022, MD.,have documented all relevant documentation on the behalf of Sherryl Manges, MD,as directed by  Sherryl Manges, MD while in the presence of Sherryl Manges, MD.   I, Sherryl Manges, MD, have reviewed all documentation for this visit. The documentation on 12/05/21 for the exam, diagnosis, procedures, and orders are all accurate and complete.

## 2021-12-05 NOTE — Progress Notes (Deleted)
Patient Care Team: Elias Else, MD as PCP - General (Family Medicine)   HPI  Jeremy Abbott is a 76 y.o. male Seen in followup for PM implanted 7/16 for CHB Medtronic complicated by high right ventricular pacing thresholds associated with syncope; he underwent extraction and new lead implantation (GT) 12/17   DATE TEST EF   7/16 Echo   65 %   1/19 Echo   55-60 %   1/19 Myoview 47% No ischemia         Date Cr K Hgb  1/19 0.77 4.6     4/20 0.71 3.9 14.6   The patient denies chest pain***, shortness of breath***, nocturnal dyspnea***, orthopnea*** or peripheral edema***.  There have been no palpitations***, lightheadedness*** or syncope***.  Complains of ***.        Hx of HTN, DM    Past Medical History:  Diagnosis Date   ADHD (attention deficit hyperactivity disorder)    Adjustment disorder with anxiety    Allergic rhinitis    Anxiety    Complete heart block (HCC)    a. s/p Medtronic PPM 06/17/15 (device dependent).   Depression    Diabetes mellitus (HCC)    Essential hypertension    Fracture of right clavicle 11/09/2009   Chronic right collarbone pain-non union   Headache    Heart murmur    at one time, not significant per pt   Hyperlipidemia    Hypokalemia    NSAID long-term use    Presence of permanent cardiac pacemaker    RECTAL BLEEDING 01/22/2008    Past Surgical History:  Procedure Laterality Date   COLONOSCOPY     EP IMPLANTABLE DEVICE N/A 06/17/2015   Procedure: Pacemaker Implant;  Surgeon: Duke Salvia, MD;  Location: [facility] INVASIVE CV LAB;  Service: Cardiovascular;  Laterality: N/A;   EP IMPLANTABLE DEVICE Right 11/06/2016   Procedure: INSERTION of new right ventricle pacemaker lead  and insertion of new pacemaker;  Surgeon: Marinus Maw, MD;  Location: [facility] OR;  Service: Cardiovascular;  Laterality: Right;   ORIF CLAVICLE FRACTURE  2/08   s/p collarbone fx with Fall-Murphy Wainer   PACEMAKER LEAD REMOVAL Left 11/06/2016   Procedure:  PACEMAKER LEAD EXTRACTION;  Surgeon: Marinus Maw, MD;  Location: [facility] OR;  Service: Cardiovascular;  Laterality: Left;  DR. Tyrone Sage TO BACKUP CASE   TEE WITHOUT CARDIOVERSION N/A 11/06/2016   Procedure: TRANSESOPHAGEAL ECHOCARDIOGRAM (TEE);  Surgeon: Marinus Maw, MD;  Location: [facility] OR;  Service: Cardiovascular;  Laterality: N/A;   TONSILLECTOMY      Current Outpatient Medications  Medication Sig Dispense Refill   amphetamine-dextroamphetamine (ADDERALL XR) 25 MG 24 hr capsule Take 25 mg by mouth every morning.     aspirin-acetaminophen-caffeine (EXCEDRIN MIGRAINE) 250-250-65 MG per tablet Take 2 tablets by mouth every 6 (six) hours as needed for headache.     glucose blood (ONE TOUCH ULTRA TEST) test strip Once daily dx 250.00 100 each 11   HYDROcodone-acetaminophen (NORCO) 7.5-325 MG tablet Take 1 tablet by mouth daily as needed (pain).      ibuprofen (ADVIL,MOTRIN) 200 MG tablet Take 600 mg by mouth every 8 (eight) hours as needed for moderate pain.     lisinopril (PRINIVIL,ZESTRIL) 20 MG tablet Take 20 mg by mouth daily.     metFORMIN (GLUCOPHAGE-XR) 500 MG 24 hr tablet Take 1,000 mg by mouth every morning.      Multiple Vitamins-Minerals (ZINC PO) Take 1 tablet by mouth daily.  ONETOUCH DELICA LANCETS MISC Once daily dx 250.00 Once daily dx 250.00 100 each 11   sildenafil (VIAGRA) 100 MG tablet Take 100 mg by mouth daily as needed for erectile dysfunction.     simvastatin (ZOCOR) 40 MG tablet Take 1 tablet (40 mg total) by mouth at bedtime. 90 tablet 3   tamsulosin (FLOMAX) 0.4 MG CAPS capsule Take 0.4 mg by mouth daily.     No current facility-administered medications for this visit.    Allergies  Allergen Reactions   No Known Allergies       Review of Systems negative except from HPI and PMH  Physical Exam There were no vitals taken for this visit. Well developed and well nourished in no acute distress HENT normal Neck supple with JVP-flat Clear Device pocket well  healed; without hematoma or erythema.  There is no tethering  Regular rate and rhythm, no *** gallop No ***/*** murmur Abd-soft with active BS No Clubbing cyanosis *** edema Skin-warm and dry A & Oriented  Grossly normal sensory and motor function  ECG ***  Assessment and  Plan  Complete heart block  Pacemaker Medtronic   S/p revision of RV lead   Hypertension   Atrial tach  Sinus tach   VT NS     Chronic RV pacing   will need echo to assess for pacemaker cardiomyopathy    HR is elevated; however, trends suggest that it is reasonably controlled in the 80s Longstanding edema.  Reviewed his diet which is salt deplete.  Somewhat replete of fluid.  Discussed the importance of monitoring for worsening edema and the development of cellulitis secondary to potential risk of device infection  Uses chronic aspirin for headaches.  Denies symptoms to suggest sleep apnea.  Will continue to follow LV function and will need an echo at his next visit.  All the Myoview EF was low at the last evaluation it was concurrent with an echocardiogram that showed a much higher ejection fraction.  In the absence of symptoms we will follow

## 2021-12-05 NOTE — Patient Instructions (Signed)
Medication Instructions:  Your physician recommends that you continue on your current medications as directed. Please refer to the Current Medication list given to you today.  *If you need a refill on your cardiac medications before your next appointment, please call your pharmacy*   Lab Work: None ordered.  If you have labs (blood work) drawn today and your tests are completely normal, you will receive your results only by: MyChart Message (if you have MyChart) OR A paper copy in the mail If you have any lab test that is abnormal or we need to change your treatment, we will call you to review the results.   Testing/Procedures: Your physician has requested that you have an echocardiogram. Echocardiography is a painless test that uses sound waves to create images of your heart. It provides your doctor with information about the size and shape of your heart and how well your hearts chambers and valves are working. This procedure takes approximately one hour. There are no restrictions for this procedure.    Follow-Up: At [facility], you and your health needs are our priority.  As part of our continuing mission to provide you with exceptional heart care, we have created designated Provider Care Teams.  These Care Teams include your primary Cardiologist (physician) and Advanced Practice Providers (APPs -  Physician Assistants and Nurse Practitioners) who all work together to provide you with the care you need, when you need it.  We recommend signing up for the patient portal called "MyChart".  Sign up information is provided on this After Visit Summary.  MyChart is used to connect with patients for Virtual Visits (Telemedicine).  Patients are able to view lab/test results, encounter notes, upcoming appointments, etc.  Non-urgent messages can be sent to your provider as well.   To learn more about what you can do with MyChart, go to [email or web address].    Your next appointment:   12  months with Dr Klein:1}    Other Instructions Pacemaker Cardiomyopathy

## 2022-01-01 ENCOUNTER — Other Ambulatory Visit: Payer: Self-pay

## 2022-01-01 ENCOUNTER — Ambulatory Visit (INDEPENDENT_AMBULATORY_CARE_PROVIDER_SITE_OTHER): Payer: Federal, State, Local not specified - PPO

## 2022-01-01 DIAGNOSIS — I429 Cardiomyopathy, unspecified: Secondary | ICD-10-CM | POA: Diagnosis not present

## 2022-01-01 DIAGNOSIS — I4729 Other ventricular tachycardia: Secondary | ICD-10-CM

## 2022-01-01 LAB — ECHOCARDIOGRAM COMPLETE
AR max vel: 2.78 cm2
AV Area VTI: 2.56 cm2
AV Area mean vel: 2.97 cm2
AV Mean grad: 5 mmHg
AV Peak grad: 9.1 mmHg
Ao pk vel: 1.51 m/s
Area-P 1/2: 4.24 cm2
Calc EF: 46.8 %
S' Lateral: 3 cm
Single Plane A2C EF: 46.4 %
Single Plane A4C EF: 48.8 %

## 2022-03-06 ENCOUNTER — Ambulatory Visit (INDEPENDENT_AMBULATORY_CARE_PROVIDER_SITE_OTHER): Payer: Federal, State, Local not specified - PPO

## 2022-03-06 DIAGNOSIS — I442 Atrioventricular block, complete: Secondary | ICD-10-CM | POA: Diagnosis not present

## 2022-03-07 LAB — CUP PACEART REMOTE DEVICE CHECK
Battery Remaining Longevity: 42 mo
Battery Voltage: 2.98 V
Brady Statistic AP VP Percent: 18.26 %
Brady Statistic AP VS Percent: 0 %
Brady Statistic AS VP Percent: 81.7 %
Brady Statistic AS VS Percent: 0.05 %
Brady Statistic RA Percent Paced: 18.24 %
Brady Statistic RV Percent Paced: 99.95 %
Date Time Interrogation Session: 20230404145237
Implantable Lead Implant Date: 20160715
Implantable Lead Implant Date: 20171205
Implantable Lead Location: 753859
Implantable Lead Location: 753860
Implantable Lead Model: 5076
Implantable Lead Model: 5076
Implantable Pulse Generator Implant Date: 20171205
Lead Channel Impedance Value: 323 Ohm
Lead Channel Impedance Value: 437 Ohm
Lead Channel Impedance Value: 475 Ohm
Lead Channel Impedance Value: 513 Ohm
Lead Channel Pacing Threshold Amplitude: 0.25 V
Lead Channel Pacing Threshold Amplitude: 0.875 V
Lead Channel Pacing Threshold Pulse Width: 0.4 ms
Lead Channel Pacing Threshold Pulse Width: 0.4 ms
Lead Channel Sensing Intrinsic Amplitude: 16 mV
Lead Channel Sensing Intrinsic Amplitude: 16 mV
Lead Channel Sensing Intrinsic Amplitude: 2.875 mV
Lead Channel Sensing Intrinsic Amplitude: 2.875 mV
Lead Channel Setting Pacing Amplitude: 1.75 V
Lead Channel Setting Pacing Amplitude: 2.5 V
Lead Channel Setting Pacing Pulse Width: 0.4 ms
Lead Channel Setting Sensing Sensitivity: 4 mV

## 2022-03-20 NOTE — Progress Notes (Signed)
Remote pacemaker transmission.   

## 2022-06-06 ENCOUNTER — Ambulatory Visit (INDEPENDENT_AMBULATORY_CARE_PROVIDER_SITE_OTHER): Payer: Federal, State, Local not specified - PPO

## 2022-06-06 DIAGNOSIS — I442 Atrioventricular block, complete: Secondary | ICD-10-CM | POA: Diagnosis not present

## 2022-06-09 LAB — CUP PACEART REMOTE DEVICE CHECK
Battery Remaining Longevity: 37 mo
Battery Voltage: 2.97 V
Brady Statistic AP VP Percent: 21.47 %
Brady Statistic AP VS Percent: 0 %
Brady Statistic AS VP Percent: 78.47 %
Brady Statistic AS VS Percent: 0.06 %
Brady Statistic RA Percent Paced: 21.44 %
Brady Statistic RV Percent Paced: 99.9 %
Date Time Interrogation Session: 20230705120214
Implantable Lead Implant Date: 20160715
Implantable Lead Implant Date: 20171205
Implantable Lead Location: 753859
Implantable Lead Location: 753860
Implantable Lead Model: 5076
Implantable Lead Model: 5076
Implantable Pulse Generator Implant Date: 20171205
Lead Channel Impedance Value: 323 Ohm
Lead Channel Impedance Value: 437 Ohm
Lead Channel Impedance Value: 475 Ohm
Lead Channel Impedance Value: 532 Ohm
Lead Channel Pacing Threshold Amplitude: 0.25 V
Lead Channel Pacing Threshold Amplitude: 0.75 V
Lead Channel Pacing Threshold Pulse Width: 0.4 ms
Lead Channel Pacing Threshold Pulse Width: 0.4 ms
Lead Channel Sensing Intrinsic Amplitude: 18.5 mV
Lead Channel Sensing Intrinsic Amplitude: 18.5 mV
Lead Channel Sensing Intrinsic Amplitude: 2.875 mV
Lead Channel Sensing Intrinsic Amplitude: 2.875 mV
Lead Channel Setting Pacing Amplitude: 1.5 V
Lead Channel Setting Pacing Amplitude: 2.5 V
Lead Channel Setting Pacing Pulse Width: 0.4 ms
Lead Channel Setting Sensing Sensitivity: 4 mV

## 2022-06-14 ENCOUNTER — Telehealth: Payer: Self-pay

## 2022-06-14 NOTE — Telephone Encounter (Signed)
Patient concerned about transmission readings in MyChart. States he doesn't understand all of the medical terminology and would like assistance understanding. Reviewed transmission in depth with patient and provided reassurance that the transmission was  WNL for him. Patient appreciative of call and reassurance. All questions answered and no further needs assessed a this time.

## 2022-06-14 NOTE — Telephone Encounter (Signed)
The patient called with lots of questions about his my-chart. I answered most of his questions. I let him speak with Portia, rn.

## 2022-06-27 NOTE — Progress Notes (Signed)
Remote pacemaker transmission.   

## 2022-09-05 ENCOUNTER — Ambulatory Visit (INDEPENDENT_AMBULATORY_CARE_PROVIDER_SITE_OTHER): Payer: Federal, State, Local not specified - PPO

## 2022-09-05 DIAGNOSIS — I441 Atrioventricular block, second degree: Secondary | ICD-10-CM

## 2022-09-05 LAB — CUP PACEART REMOTE DEVICE CHECK
Battery Remaining Longevity: 36 mo
Battery Voltage: 2.96 V
Brady Statistic AP VP Percent: 21.41 %
Brady Statistic AP VS Percent: 0 %
Brady Statistic AS VP Percent: 78.5 %
Brady Statistic AS VS Percent: 0.09 %
Brady Statistic RA Percent Paced: 21.37 %
Brady Statistic RV Percent Paced: 99.82 %
Date Time Interrogation Session: 20231004125750
Implantable Lead Implant Date: 20160715
Implantable Lead Implant Date: 20171205
Implantable Lead Location: 753859
Implantable Lead Location: 753860
Implantable Lead Model: 5076
Implantable Lead Model: 5076
Implantable Pulse Generator Implant Date: 20171205
Lead Channel Impedance Value: 323 Ohm
Lead Channel Impedance Value: 418 Ohm
Lead Channel Impedance Value: 475 Ohm
Lead Channel Impedance Value: 532 Ohm
Lead Channel Pacing Threshold Amplitude: 0.25 V
Lead Channel Pacing Threshold Amplitude: 0.75 V
Lead Channel Pacing Threshold Pulse Width: 0.4 ms
Lead Channel Pacing Threshold Pulse Width: 0.4 ms
Lead Channel Sensing Intrinsic Amplitude: 2.75 mV
Lead Channel Sensing Intrinsic Amplitude: 2.75 mV
Lead Channel Sensing Intrinsic Amplitude: 20.5 mV
Lead Channel Sensing Intrinsic Amplitude: 20.5 mV
Lead Channel Setting Pacing Amplitude: 1.5 V
Lead Channel Setting Pacing Amplitude: 2.5 V
Lead Channel Setting Pacing Pulse Width: 0.4 ms
Lead Channel Setting Sensing Sensitivity: 4 mV

## 2022-09-10 NOTE — Progress Notes (Signed)
Remote pacemaker transmission.   

## 2022-11-30 ENCOUNTER — Encounter: Payer: Self-pay | Admitting: Internal Medicine

## 2022-11-30 ENCOUNTER — Ambulatory Visit: Payer: Federal, State, Local not specified - PPO | Attending: Internal Medicine | Admitting: Internal Medicine

## 2022-11-30 VITALS — BP 120/58 | HR 83 | Ht 69.0 in | Wt 216.0 lb

## 2022-11-30 DIAGNOSIS — R000 Tachycardia, unspecified: Secondary | ICD-10-CM

## 2022-11-30 DIAGNOSIS — I442 Atrioventricular block, complete: Secondary | ICD-10-CM | POA: Diagnosis not present

## 2022-11-30 DIAGNOSIS — I4719 Other supraventricular tachycardia: Secondary | ICD-10-CM | POA: Diagnosis not present

## 2022-11-30 DIAGNOSIS — I4729 Other ventricular tachycardia: Secondary | ICD-10-CM

## 2022-11-30 DIAGNOSIS — Z950 Presence of cardiac pacemaker: Secondary | ICD-10-CM

## 2022-11-30 MED ORDER — AMLODIPINE BESYLATE 2.5 MG PO TABS: 2.5000 mg | ORAL_TABLET | Freq: Every day | ORAL | 3 refills | Status: AC

## 2022-11-30 MED ORDER — HYDROCHLOROTHIAZIDE 12.5 MG PO CAPS: 12.5000 mg | ORAL_CAPSULE | Freq: Every day | ORAL | 0 refills | Status: DC

## 2022-11-30 NOTE — Patient Instructions (Signed)
Medication Instructions:  Your physician has recommended you make the following change in your medication:  1) DECREASE amlodipine to 2.5 mg daily  2) START taking HCTZ 12.5 mg daily - let us know when you are about to run out of HCTZ and losartan and we will send in a prescription for Benicar (losartan-HCTZ) 40-12.5 mg daily  *If you need a refill on your cardiac medications before your next appointment, please call your pharmacy*  Lab Work: IN TWO WEEKS: BMET If you have labs (blood work) drawn today and your tests are completely normal, you will receive your results only by: MyChart Message (if you have MyChart) OR A paper copy in the mail If you have any lab test that is abnormal or we need to change your treatment, we will call you to review the results.  Follow-Up: At [facility], you and your health needs are our priority.  As part of our continuing mission to provide you with exceptional heart care, we have created designated Provider Care Teams.  These Care Teams include your primary Cardiologist (physician) and Advanced Practice Providers (APPs -  Physician Assistants and Nurse Practitioners) who all work together to provide you with the care you need, when you need it.  Your next appointment:   1 year(s)  The format for your next appointment:   In Person  Provider:   You may see Dr. Graciela Husbands or one of the following Advanced Practice Providers on your designated Care Team:   Francis Dowse, [place] Casimiro Needle "Mardelle Matte" Lanna Poche, [place]   Important Information About Sugar

## 2022-11-30 NOTE — Progress Notes (Signed)
Patient Care Team: Maury Dus, MD as PCP - General (Family Medicine)   HPI  Jeremy Abbott is a 76 y.o. male is seen in followup for PM implanted 7/16 for CHB Medtronic complicated by high right ventricular pacing thresholds associated with syncope; he underwent extraction and new lead implantation (GT) 12/17.  Longstanding ventricular pacing, echo as noted below 1/23, without evidence of pacemaker cardiomyopathy  Hx of hypertension, diabetes mellitus, heart murmur, hypokalemia, hyperlipidemia, depression, and anxiety.  Recently had his blood pressure medications changed to amlodipine and olmesartan.  From lisinopril.  Has a history of edema, review of the old records suggest that improved with hydrochlorothiazide. The patient denies chest pain, shortness of breath, nocturnal dyspnea, orthopnea.  There have been no palpitations, lightheadedness or syncope.   Enjoys drumming      He had five COVID shots, and is asymptomatic at this time.     DATE TEST EF    7/16 Echo   65 %    1/19 Echo   55-60 %    1/19 Myoview 47% No ischemia   1/23  Echo  55-60%        Date Cr K Hgb  1/19 0.77 4.6     4/20 0.71 3.9 14.6  9/22 0.66 3.7 13.8  10/23 0.69 4.0 14.5              Past Medical History:  Diagnosis Date   ADHD (attention deficit hyperactivity disorder)    Adjustment disorder with anxiety    Allergic rhinitis    Anxiety    Complete heart block ([place])    a. s/p Medtronic PPM 06/17/15 (device dependent).   Depression    Diabetes mellitus ([place])    Essential hypertension    Fracture of right clavicle 11/09/2009   Chronic right collarbone pain-non union   Headache    Heart murmur    at one time, not significant per pt   Hyperlipidemia    Hypokalemia    NSAID long-term use    Presence of permanent cardiac pacemaker    RECTAL BLEEDING 01/22/2008    Past Surgical History:  Procedure Laterality Date   COLONOSCOPY     EP IMPLANTABLE DEVICE N/A 06/17/2015    Procedure: Pacemaker Implant;  Surgeon: Deboraha Sprang, MD;  Location: [facility];  Service: Cardiovascular;  Laterality: N/A;   EP IMPLANTABLE DEVICE Right 11/06/2016   Procedure: INSERTION of new right ventricle pacemaker lead  and insertion of new pacemaker;  Surgeon: Evans Lance, MD;  Location: [facility];  Service: Cardiovascular;  Laterality: Right;   ORIF CLAVICLE FRACTURE  2/08   s/p collarbone fx with Fall-Murphy Wainer   PACEMAKER LEAD REMOVAL Left 11/06/2016   Procedure: PACEMAKER LEAD EXTRACTION;  Surgeon: Evans Lance, MD;  Location: [facility];  Service: Cardiovascular;  Laterality: Left;  DR. Servando Snare TO BACKUP CASE   TEE WITHOUT CARDIOVERSION N/A 11/06/2016   Procedure: TRANSESOPHAGEAL ECHOCARDIOGRAM (TEE);  Surgeon: Evans Lance, MD;  Location: [facility];  Service: Cardiovascular;  Laterality: N/A;   TONSILLECTOMY      Current Outpatient Medications  Medication Sig Dispense Refill   amLODipine (NORVASC) 5 MG tablet Take 5 mg by mouth every morning.     amphetamine-dextroamphetamine (ADDERALL XR) 25 MG 24 hr capsule Take 25 mg by mouth every morning.     aspirin-acetaminophen-caffeine (EXCEDRIN MIGRAINE) 250-250-65 MG per tablet Take 2 tablets by mouth every 6 (six) hours as needed for headache.  atorvastatin (LIPITOR) 20 MG tablet Take 20 mg by mouth daily.     buPROPion (WELLBUTRIN XL) 150 MG 24 hr tablet Take 150 mg by mouth every morning.     HYDROcodone-acetaminophen (NORCO) 7.5-325 MG tablet Take 1 tablet by mouth daily as needed (pain).      metFORMIN (GLUCOPHAGE-XR) 500 MG 24 hr tablet Take 1,000 mg by mouth every morning.      Multiple Vitamins-Minerals (ZINC PO) Take 1 tablet by mouth daily.     olmesartan (BENICAR) 40 MG tablet Take 40 mg by mouth every morning.     sildenafil (VIAGRA) 100 MG tablet Take 100 mg by mouth daily as needed for erectile dysfunction.     tadalafil (CIALIS) 5 MG tablet Take 5 mg by mouth daily.     tamsulosin (FLOMAX) 0.4 MG CAPS capsule  Take 0.4 mg by mouth daily.     No current facility-administered medications for this visit.    Allergies  Allergen Reactions   Amitriptyline Hcl Other (See Comments)    Sleeping for long periods of time   No Known Allergies       Review of Systems negative except from HPI and PMH Please see the history of present illness. (+) Shortness of Breath (+) LE Edema All other systems are reviewed and negative.     Physical Exam BP (!) 120/58   Pulse 83   Ht 5\' 9"  (1.753 m)   Wt 216 lb (98 kg)   SpO2 98%   BMI 31.90 kg/m  Well developed and well nourished in no acute distress HENT normal Neck supple with JVP-flat Clear Device pocket well healed; without hematoma or erythema.  There is no tethering  Regular rate and rhythm, no  murmur Abd-soft with active BS No Clubbing cyanosis  edema Skin-warm and dry A & Oriented  Grossly normal sensory and motor function  ECG sinus rhythm with P synchronous pacing  Device function is normal. Programming changes increase the upper tracking rate so as to avoid 2-1 behavior See Paceart for details     Assessment and  Plan  Complete heart block  Pacemaker Medtronic   S/p revision of RV lead   Hypertension    Atrial tach  Sinus tach   VT NS    Device was reprogrammed to allow for more appropriate upper rate behavior.  Blood pressure is welcome to hold.  We will plan to decrease his amlodipine however given his edema from 5--2.5.  We will add back low-dose diuretic stopped in 2017 to olmesartan he is now from 40--40/12.5.  We will check a metabolic profile in 2 weeks or 3 weeks as he has had a history of hypokalemia in the past.

## 2022-12-04 ENCOUNTER — Encounter: Payer: Federal, State, Local not specified - PPO | Admitting: Internal Medicine

## 2022-12-14 ENCOUNTER — Other Ambulatory Visit: Payer: Federal, State, Local not specified - PPO

## 2022-12-24 ENCOUNTER — Ambulatory Visit: Payer: Federal, State, Local not specified - PPO | Attending: Internal Medicine

## 2022-12-24 DIAGNOSIS — Z950 Presence of cardiac pacemaker: Secondary | ICD-10-CM

## 2022-12-24 DIAGNOSIS — I4729 Other ventricular tachycardia: Secondary | ICD-10-CM

## 2022-12-24 DIAGNOSIS — I4719 Other supraventricular tachycardia: Secondary | ICD-10-CM

## 2022-12-24 DIAGNOSIS — R000 Tachycardia, unspecified: Secondary | ICD-10-CM

## 2022-12-24 DIAGNOSIS — I442 Atrioventricular block, complete: Secondary | ICD-10-CM

## 2022-12-25 LAB — BASIC METABOLIC PANEL
BUN/Creatinine Ratio: 27 — ABNORMAL HIGH (ref 10–24)
BUN: 18 mg/dL (ref 8–27)
CO2: 26 mmol/L (ref 20–29)
Calcium: 9.1 mg/dL (ref 8.6–10.2)
Chloride: 100 mmol/L (ref 96–106)
Creatinine, Ser: 0.66 mg/dL — ABNORMAL LOW (ref 0.76–1.27)
Glucose: 128 mg/dL — ABNORMAL HIGH (ref 70–99)
Potassium: 4.1 mmol/L (ref 3.5–5.2)
Sodium: 142 mmol/L (ref 134–144)
eGFR: 97 mL/min/{1.73_m2} (ref >59)

## 2023-01-23 ENCOUNTER — Other Ambulatory Visit: Payer: Self-pay | Admitting: Internal Medicine

## 2023-01-29 ENCOUNTER — Other Ambulatory Visit: Payer: Self-pay

## 2023-01-29 MED ORDER — OLMESARTAN MEDOXOMIL-HCTZ 40-12.5 MG PO TABS: 1.0000 | ORAL_TABLET | Freq: Every day | ORAL | 3 refills | Status: DC

## 2023-02-28 ENCOUNTER — Encounter (INDEPENDENT_AMBULATORY_CARE_PROVIDER_SITE_OTHER): Payer: Federal, State, Local not specified - PPO | Admitting: Ophthalmology

## 2023-03-06 ENCOUNTER — Ambulatory Visit (INDEPENDENT_AMBULATORY_CARE_PROVIDER_SITE_OTHER): Payer: Federal, State, Local not specified - PPO

## 2023-03-06 DIAGNOSIS — I442 Atrioventricular block, complete: Secondary | ICD-10-CM

## 2023-03-06 LAB — CUP PACEART REMOTE DEVICE CHECK
Battery Remaining Longevity: 33 mo
Battery Voltage: 2.96 V
Brady Statistic AP VP Percent: 13.91 %
Brady Statistic AP VS Percent: 0 %
Brady Statistic AS VP Percent: 86.04 %
Brady Statistic AS VS Percent: 0.05 %
Brady Statistic RA Percent Paced: 13.89 %
Brady Statistic RV Percent Paced: 99.89 %
Date Time Interrogation Session: 20240403131557
Implantable Lead Connection Status: 753985
Implantable Lead Connection Status: 753985
Implantable Lead Implant Date: 20160715
Implantable Lead Implant Date: 20171205
Implantable Lead Location: 753859
Implantable Lead Location: 753860
Implantable Lead Model: 5076
Implantable Lead Model: 5076
Implantable Pulse Generator Implant Date: 20171205
Lead Channel Impedance Value: 323 Ohm
Lead Channel Impedance Value: 437 Ohm
Lead Channel Impedance Value: 437 Ohm
Lead Channel Impedance Value: 475 Ohm
Lead Channel Pacing Threshold Amplitude: 0.375 V
Lead Channel Pacing Threshold Amplitude: 0.875 V
Lead Channel Pacing Threshold Pulse Width: 0.4 ms
Lead Channel Pacing Threshold Pulse Width: 0.4 ms
Lead Channel Sensing Intrinsic Amplitude: 10.25 mV
Lead Channel Sensing Intrinsic Amplitude: 10.25 mV
Lead Channel Sensing Intrinsic Amplitude: 2.625 mV
Lead Channel Sensing Intrinsic Amplitude: 2.625 mV
Lead Channel Setting Pacing Amplitude: 1.75 V
Lead Channel Setting Pacing Amplitude: 2.5 V
Lead Channel Setting Pacing Pulse Width: 0.4 ms
Lead Channel Setting Sensing Sensitivity: 4 mV
Zone Setting Status: 755011
Zone Setting Status: 755011

## 2023-04-11 NOTE — Progress Notes (Signed)
Remote pacemaker transmission.   

## 2023-06-07 ENCOUNTER — Ambulatory Visit (INDEPENDENT_AMBULATORY_CARE_PROVIDER_SITE_OTHER): Payer: Federal, State, Local not specified - PPO

## 2023-06-07 DIAGNOSIS — I442 Atrioventricular block, complete: Secondary | ICD-10-CM | POA: Diagnosis not present

## 2023-06-08 LAB — CUP PACEART REMOTE DEVICE CHECK
Battery Remaining Longevity: 30 mo
Battery Voltage: 2.95 V
Brady Statistic AP VP Percent: 17.4 %
Brady Statistic AP VS Percent: 0 %
Brady Statistic AS VP Percent: 82.56 %
Brady Statistic AS VS Percent: 0.04 %
Brady Statistic RA Percent Paced: 17.37 %
Brady Statistic RV Percent Paced: 99.9 %
Date Time Interrogation Session: 20240706110243
Implantable Lead Connection Status: 753985
Implantable Lead Connection Status: 753985
Implantable Lead Implant Date: 20160715
Implantable Lead Implant Date: 20171205
Implantable Lead Location: 753859
Implantable Lead Location: 753860
Implantable Lead Model: 5076
Implantable Lead Model: 5076
Implantable Pulse Generator Implant Date: 20171205
Lead Channel Impedance Value: 323 Ohm
Lead Channel Impedance Value: 437 Ohm
Lead Channel Impedance Value: 494 Ohm
Lead Channel Impedance Value: 551 Ohm
Lead Channel Pacing Threshold Amplitude: 0.375 V
Lead Channel Pacing Threshold Amplitude: 0.75 V
Lead Channel Pacing Threshold Pulse Width: 0.4 ms
Lead Channel Pacing Threshold Pulse Width: 0.4 ms
Lead Channel Sensing Intrinsic Amplitude: 17.625 mV
Lead Channel Sensing Intrinsic Amplitude: 17.625 mV
Lead Channel Sensing Intrinsic Amplitude: 2.5 mV
Lead Channel Sensing Intrinsic Amplitude: 2.5 mV
Lead Channel Setting Pacing Amplitude: 1.5 V
Lead Channel Setting Pacing Amplitude: 2.5 V
Lead Channel Setting Pacing Pulse Width: 0.4 ms
Lead Channel Setting Sensing Sensitivity: 4 mV
Zone Setting Status: 755011
Zone Setting Status: 755011

## 2023-06-10 ENCOUNTER — Telehealth: Payer: Self-pay

## 2023-06-10 NOTE — Telephone Encounter (Signed)
Pt called to get his yearly appointment scheduled. I told him the scheduler will give him a call back.

## 2023-06-24 NOTE — Progress Notes (Signed)
Remote pacemaker transmission.   

## 2023-09-06 ENCOUNTER — Ambulatory Visit: Payer: Federal, State, Local not specified - PPO

## 2023-09-06 DIAGNOSIS — I442 Atrioventricular block, complete: Secondary | ICD-10-CM

## 2023-09-06 LAB — CUP PACEART REMOTE DEVICE CHECK
Battery Remaining Longevity: 28 mo
Battery Voltage: 2.94 V
Brady Statistic AP VP Percent: 14.82 %
Brady Statistic AP VS Percent: 0 %
Brady Statistic AS VP Percent: 85.16 %
Brady Statistic AS VS Percent: 0.02 %
Brady Statistic RA Percent Paced: 14.81 %
Brady Statistic RV Percent Paced: 99.95 %
Date Time Interrogation Session: 20241004105204
Implantable Lead Connection Status: 753985
Implantable Lead Connection Status: 753985
Implantable Lead Implant Date: 20160715
Implantable Lead Implant Date: 20171205
Implantable Lead Location: 753859
Implantable Lead Location: 753860
Implantable Lead Model: 5076
Implantable Lead Model: 5076
Implantable Pulse Generator Implant Date: 20171205
Lead Channel Impedance Value: 323 Ohm
Lead Channel Impedance Value: 456 Ohm
Lead Channel Impedance Value: 494 Ohm
Lead Channel Impedance Value: 551 Ohm
Lead Channel Pacing Threshold Amplitude: 0.25 V
Lead Channel Pacing Threshold Amplitude: 0.875 V
Lead Channel Pacing Threshold Pulse Width: 0.4 ms
Lead Channel Pacing Threshold Pulse Width: 0.4 ms
Lead Channel Sensing Intrinsic Amplitude: 10.5 mV
Lead Channel Sensing Intrinsic Amplitude: 10.5 mV
Lead Channel Sensing Intrinsic Amplitude: 2.625 mV
Lead Channel Sensing Intrinsic Amplitude: 2.625 mV
Lead Channel Setting Pacing Amplitude: 1.75 V
Lead Channel Setting Pacing Amplitude: 2.5 V
Lead Channel Setting Pacing Pulse Width: 0.4 ms
Lead Channel Setting Sensing Sensitivity: 4 mV
Zone Setting Status: 755011
Zone Setting Status: 755011

## 2023-09-13 NOTE — Progress Notes (Signed)
Remote pacemaker transmission.   

## 2023-12-06 ENCOUNTER — Encounter: Payer: Federal, State, Local not specified - PPO | Admitting: Internal Medicine

## 2023-12-06 ENCOUNTER — Encounter: Payer: Self-pay | Admitting: Internal Medicine

## 2023-12-06 ENCOUNTER — Ambulatory Visit: Payer: No Typology Code available for payment source | Attending: Internal Medicine | Admitting: Internal Medicine

## 2023-12-06 VITALS — BP 124/72 | HR 86 | Ht 69.0 in | Wt 215.2 lb

## 2023-12-06 DIAGNOSIS — I442 Atrioventricular block, complete: Secondary | ICD-10-CM | POA: Diagnosis not present

## 2023-12-06 DIAGNOSIS — I4719 Other supraventricular tachycardia: Secondary | ICD-10-CM | POA: Insufficient documentation

## 2023-12-06 DIAGNOSIS — I4729 Other ventricular tachycardia: Secondary | ICD-10-CM | POA: Diagnosis present

## 2023-12-06 DIAGNOSIS — Z950 Presence of cardiac pacemaker: Secondary | ICD-10-CM | POA: Insufficient documentation

## 2023-12-06 LAB — CUP PACEART INCLINIC DEVICE CHECK
Battery Remaining Longevity: 26 mo
Battery Voltage: 2.93 V
Brady Statistic AP VP Percent: 15.34 %
Brady Statistic AP VS Percent: 0 %
Brady Statistic AS VP Percent: 84.63 %
Brady Statistic AS VS Percent: 0.04 %
Brady Statistic RA Percent Paced: 15.32 %
Brady Statistic RV Percent Paced: 99.92 %
Date Time Interrogation Session: 20250103151103
Implantable Lead Connection Status: 753985
Implantable Lead Connection Status: 753985
Implantable Lead Implant Date: 20160715
Implantable Lead Implant Date: 20171205
Implantable Lead Location: 753859
Implantable Lead Location: 753860
Implantable Lead Model: 5076
Implantable Lead Model: 5076
Implantable Pulse Generator Implant Date: 20171205
Lead Channel Impedance Value: 323 Ohm
Lead Channel Impedance Value: 456 Ohm
Lead Channel Impedance Value: 513 Ohm
Lead Channel Impedance Value: 570 Ohm
Lead Channel Pacing Threshold Amplitude: 0.375 V
Lead Channel Pacing Threshold Amplitude: 0.4 V
Lead Channel Pacing Threshold Amplitude: 0.75 V
Lead Channel Pacing Threshold Amplitude: 0.875 V
Lead Channel Pacing Threshold Pulse Width: 0.4 ms
Lead Channel Pacing Threshold Pulse Width: 0.4 ms
Lead Channel Pacing Threshold Pulse Width: 0.4 ms
Lead Channel Pacing Threshold Pulse Width: 0.4 ms
Lead Channel Sensing Intrinsic Amplitude: 0 mV
Lead Channel Sensing Intrinsic Amplitude: 11 mV
Lead Channel Sensing Intrinsic Amplitude: 11 mV
Lead Channel Sensing Intrinsic Amplitude: 2.875 mV
Lead Channel Sensing Intrinsic Amplitude: 2.875 mV
Lead Channel Sensing Intrinsic Amplitude: 3.1 mV
Lead Channel Setting Pacing Amplitude: 1.75 V
Lead Channel Setting Pacing Amplitude: 2.5 V
Lead Channel Setting Pacing Pulse Width: 0.4 ms
Lead Channel Setting Sensing Sensitivity: 4 mV
Zone Setting Status: 755011
Zone Setting Status: 755011

## 2023-12-06 MED ORDER — FUROSEMIDE 40 MG PO TABS: ORAL_TABLET | ORAL | 0 refills | Status: AC

## 2023-12-06 NOTE — Progress Notes (Signed)
 Patient Care Team: Gib Charleston, MD (Inactive) as PCP - General (Family Medicine)   HPI  Jeremy Abbott is a 78 y.o. male is seen in followup for PM implanted 7/16 for CHB Medtronic complicated by high right ventricular pacing thresholds associated with syncope; he underwent extraction and new lead implantation (GT) 12/17.  Longstanding ventricular pacing, echo as noted below 1/23, without evidence of pacemaker cardiomyopathy  Hx of hypertension, diabetes mellitus, heart murmur, hypokalemia, hyperlipidemia, depression, and anxiety.  Recently had his blood pressure medications changed to amlodipine  and olmesartan .  From lisinopril .  Has a history of edema, review of the old records suggest that improved with hydrochlorothiazide .   The patient denies chest pain, shortness of breath, nocturnal dyspnea, orthopnea .  There have been no palpitations, lightheadedness or syncope.  While not fit, does a fair amount of work moving things.   some peripheral edema, right greater than left improves overnight       DATE TEST EF    7/16 Echo   65 %    1/19 Echo   55-60 %    1/19 Myoview  47% No ischemia   1/23  Echo  55-60%        Date Cr K Hgb  1/19 0.77 4.6     4/20 0.71 3.9 14.6  9/22 0.66 3.7 13.8  10/23 0.69 4.0 14.5  10/24   3.8 13.5 (4/24)              Past Medical History:  Diagnosis Date   ADHD (attention deficit hyperactivity disorder)    Adjustment disorder with anxiety    Allergic rhinitis    Anxiety    Complete heart block ([facility])    a. s/p Medtronic PPM 06/17/15 (device dependent).   Depression    Diabetes mellitus ([facility])    Essential hypertension    Fracture of right clavicle 11/09/2009   Chronic right collarbone pain-non union   Headache    Heart murmur    at one time, not significant per pt   Hyperlipidemia    Hypokalemia    NSAID long-term use    Presence of permanent cardiac pacemaker    RECTAL BLEEDING 01/22/2008    Past Surgical History:   Procedure Laterality Date   COLONOSCOPY     EP IMPLANTABLE DEVICE N/A 06/17/2015   Procedure: Pacemaker Implant;  Surgeon: Elspeth JAYSON Sage, MD;  Location: [facility] IN[facility]IVE [facility];  Service: Cardiovascular;  Laterality: N/A;   EP IMPLANTABLE DEVICE Right 11/06/2016   Procedure: INSERTION of new right ventricle pacemaker lead  and insertion of new pacemaker;  Surgeon: Danelle LELON Birmingham, MD;  Location: [facility] OR;  Service: Cardiovascular;  Laterality: Right;   ORIF CLAVICLE FRACTURE  2/08   s/p collarbone fx with Fall-Murphy Wainer   PACEMAKER LEAD REMOVAL Left 11/06/2016   Procedure: PACEMAKER LEAD EXTRACTION;  Surgeon: Danelle LELON Birmingham, MD;  Location: [facility];  Service: Cardiovascular;  Laterality: Left;  DR. ARMY TO BACKUP CASE   TEE WITHOUT CARDIOVERSION N/A 11/06/2016   Procedure: TRANSESOPHAGEAL ECHOCARDIOGRAM (TEE);  Surgeon: Danelle LELON Birmingham, MD;  Location: [facility] OR;  Service: Cardiovascular;  Laterality: N/A;   TONSILLECTOMY      Current Outpatient Medications  Medication Sig Dispense Refill   amLODipine  (NORVASC ) 2.5 MG tablet Take 1 tablet (2.5 mg total) by mouth daily. 90 tablet 3   amphetamine -dextroamphetamine  (ADDERALL XR) 25 MG 24 hr capsule Take 25 mg by mouth every morning.     aspirin -acetaminophen -caffeine  (EXCEDRIN   MIGRAINE) 250-250-65 MG per tablet Take 2 tablets by mouth every 6 (six) hours as needed for headache.     atorvastatin  (LIPITOR) 20 MG tablet Take 20 mg by mouth daily.     buPROPion  (WELLBUTRIN  XL) 150 MG 24 hr tablet Take 150 mg by mouth every morning.     HYDROcodone -acetaminophen  (NORCO) 7.5-325 MG tablet Take 1 tablet by mouth daily as needed (pain).      metFORMIN  (GLUCOPHAGE -XR) 500 MG 24 hr tablet Take 1,000 mg by mouth every morning.      Multiple Vitamins-Minerals (ZINC PO) Take 1 tablet by mouth daily.     olmesartan -hydrochlorothiazide  (BENICAR  HCT) 40-12.5 MG tablet Take 1 tablet by mouth daily. 90 tablet 3   tadalafil (CIALIS) 5 MG tablet Take 5 mg by mouth daily.      tamsulosin  (FLOMAX ) 0.4 MG CAPS capsule Take 0.4 mg by mouth daily.     No current facility-administered medications for this visit.    Allergies  Allergen Reactions   Amitriptyline  Hcl Other (See Comments)    Sleeping for long periods of time   No Known Allergies         Physical Exam BP 124/72   Pulse 86   Ht 5' 9 (1.753 m)   Wt 215 lb 3.2 oz (97.6 kg)   SpO2 98%   BMI 31.78 kg/m  Well developed and well nourished in no acute distress HENT normal Neck supple  Clear Device pocket well healed; without hematoma or erythema.  There is no tethering  Regular rate and rhythm, no  gallop No murmur Abd-soft  No Clubbing cyanosis 2+ R 1+ L edema Skin-warm and dry A & Oriented  Grossly normal sensory and motor function  ECG sinus with P synchronous pacing at 86  Device function is normal. Programming changes none  See Paceart for details     Assessment and  Plan  Complete heart block  Pacemaker Medtronic   S/p revision of RV lead   Hypertension    Atrial tach  Sinus tach   VT NS   Volume overload.  Question HFpEF   Blood pressure is well-controlled.  Continue the amlodipine  in addition to the Benicar  HCT.  He has peripheral edema that is asymmetric.  Dose of the amlodipine  is low so I suspect it is not related to this.  Suspect hypertensive heart disease and will give him furosemide  40 mg q. OD x 4 doses.  I have asked him to follow-up with his primary care physician as to whether his metformin  can be switched out for an SGLT2 for both diabetes as well as the presumed HFpEF

## 2023-12-06 NOTE — Patient Instructions (Signed)
 Medication Instructions:  Your physician has recommended you make the following change in your medication:   ** Take Furosemide  40mg  - 1 tablet by mouth every other day x 4 doses then stop.   *If you need a refill on your cardiac medications before your next appointment, please call your pharmacy*   Lab Work: None ordered.  If you have labs (blood work) drawn today and your tests are completely normal, you will receive your results only by: MyChart Message (if you have MyChart) OR A paper copy in the mail If you have any lab test that is abnormal or we need to change your treatment, we will call you to review the results.   Testing/Procedures: None ordered.    Follow-Up: At [facility], you and your health needs are our priority.  As part of our continuing mission to provide you with exceptional heart care, we have created designated Provider Care Teams.  These Care Teams include your primary Cardiologist (physician) and Advanced Practice Providers (APPs -  Physician Assistants and Nurse Practitioners) who all work together to provide you with the care you need, when you need it.  We recommend signing up for the patient portal called MyChart.  Sign up information is provided on this After Visit Summary.  MyChart is used to connect with patients for Virtual Visits (Telemedicine).  Patients are able to view lab/test results, encounter notes, upcoming appointments, etc.  Non-urgent messages can be sent to your provider as well.   To learn more about what you can do with MyChart, go to [email or web address].    Your next appointment:   12 months with Dr Fernande

## 2024-03-06 ENCOUNTER — Ambulatory Visit (INDEPENDENT_AMBULATORY_CARE_PROVIDER_SITE_OTHER): Payer: Federal, State, Local not specified - PPO

## 2024-03-06 DIAGNOSIS — I442 Atrioventricular block, complete: Secondary | ICD-10-CM

## 2024-03-08 ENCOUNTER — Encounter: Payer: Self-pay | Admitting: Internal Medicine

## 2024-03-09 LAB — CUP PACEART REMOTE DEVICE CHECK
Battery Remaining Longevity: 24 mo
Battery Voltage: 2.93 V
Brady Statistic AP VP Percent: 7.39 %
Brady Statistic AP VS Percent: 0 %
Brady Statistic AS VP Percent: 92.54 %
Brady Statistic AS VS Percent: 0.07 %
Brady Statistic RA Percent Paced: 7.39 %
Brady Statistic RV Percent Paced: 99.91 %
Date Time Interrogation Session: 20250407101844
Implantable Lead Connection Status: 753985
Implantable Lead Connection Status: 753985
Implantable Lead Implant Date: 20160715
Implantable Lead Implant Date: 20171205
Implantable Lead Location: 753859
Implantable Lead Location: 753860
Implantable Lead Model: 5076
Implantable Lead Model: 5076
Implantable Pulse Generator Implant Date: 20171205
Lead Channel Impedance Value: 323 Ohm
Lead Channel Impedance Value: 418 Ohm
Lead Channel Impedance Value: 475 Ohm
Lead Channel Impedance Value: 532 Ohm
Lead Channel Pacing Threshold Amplitude: 0.375 V
Lead Channel Pacing Threshold Amplitude: 0.625 V
Lead Channel Pacing Threshold Pulse Width: 0.4 ms
Lead Channel Pacing Threshold Pulse Width: 0.4 ms
Lead Channel Sensing Intrinsic Amplitude: 2.625 mV
Lead Channel Sensing Intrinsic Amplitude: 2.625 mV
Lead Channel Sensing Intrinsic Amplitude: 5.5 mV
Lead Channel Sensing Intrinsic Amplitude: 5.5 mV
Lead Channel Setting Pacing Amplitude: 1.5 V
Lead Channel Setting Pacing Amplitude: 2.5 V
Lead Channel Setting Pacing Pulse Width: 0.4 ms
Lead Channel Setting Sensing Sensitivity: 4 mV
Zone Setting Status: 755011
Zone Setting Status: 755011

## 2024-03-14 ENCOUNTER — Encounter: Payer: Self-pay | Admitting: Internal Medicine

## 2024-04-13 NOTE — Addendum Note (Signed)
 Addended by: Lott Rouleau A on: 04/13/2024 09:05 AM   Modules accepted: Orders

## 2024-04-13 NOTE — Progress Notes (Signed)
 Remote pacemaker transmission.

## 2024-04-15 ENCOUNTER — Other Ambulatory Visit (HOSPITAL_COMMUNITY): Payer: Self-pay | Admitting: Urology

## 2024-04-15 DIAGNOSIS — R972 Elevated prostate specific antigen [PSA]: Secondary | ICD-10-CM

## 2024-04-24 ENCOUNTER — Other Ambulatory Visit (HOSPITAL_COMMUNITY): Payer: Self-pay | Admitting: Urology

## 2024-04-24 DIAGNOSIS — R972 Elevated prostate specific antigen [PSA]: Secondary | ICD-10-CM

## 2024-05-13 NOTE — CV Procedure (Signed)
  Device system confirmed to be MRI conditional, with implant date > 6 weeks ago, and no evidence of abandoned or epicardial leads in review of most recent CXR  Device last cleared by EP Provider: Valeri Gate 05/13/24   Clearance is good through for 1 year as long as parameters remain stable at time of check. If pt undergoes a cardiac device procedure during that time, they should be re-cleared.   Tachy-therapies to be programmed off if applicable with device back to pre-MRI settings after completion of exam.  Medtronic - Programming recommendation received through Medtronic App/Tablet  Arlys Lamer, RT  05/13/2024 8:51 AM

## 2024-05-19 ENCOUNTER — Ambulatory Visit (HOSPITAL_COMMUNITY)
Admission: RE | Admit: 2024-05-19 | Discharge: 2024-05-19 | Disposition: A | Discharge status: Home / Self Care | Source: Ambulatory Visit | Attending: Urology | Admitting: Urology

## 2024-05-19 ENCOUNTER — Encounter (HOSPITAL_COMMUNITY): Payer: Self-pay | Admitting: Urology

## 2024-05-19 ENCOUNTER — Other Ambulatory Visit (HOSPITAL_COMMUNITY): Payer: Self-pay | Admitting: Urology

## 2024-05-19 DIAGNOSIS — R972 Elevated prostate specific antigen [PSA]: Secondary | ICD-10-CM | POA: Diagnosis present

## 2024-06-08 ENCOUNTER — Ambulatory Visit: Payer: Federal, State, Local not specified - PPO

## 2024-06-08 DIAGNOSIS — I442 Atrioventricular block, complete: Secondary | ICD-10-CM

## 2024-06-08 LAB — CUP PACEART REMOTE DEVICE CHECK
Battery Remaining Longevity: 20 mo
Battery Voltage: 2.91 V
Brady Statistic AP VP Percent: 18.76 %
Brady Statistic AP VS Percent: 0.01 %
Brady Statistic AS VP Percent: 81.12 %
Brady Statistic AS VS Percent: 0.11 %
Brady Statistic RA Percent Paced: 18.74 %
Brady Statistic RV Percent Paced: 99.79 %
Date Time Interrogation Session: 20250707072845
Implantable Lead Connection Status: 753985
Implantable Lead Connection Status: 753985
Implantable Lead Implant Date: 20160715
Implantable Lead Implant Date: 20171205
Implantable Lead Location: 753859
Implantable Lead Location: 753860
Implantable Lead Model: 5076
Implantable Lead Model: 5076
Implantable Pulse Generator Implant Date: 20171205
Lead Channel Impedance Value: 304 Ohm
Lead Channel Impedance Value: 418 Ohm
Lead Channel Impedance Value: 456 Ohm
Lead Channel Impedance Value: 513 Ohm
Lead Channel Pacing Threshold Amplitude: 0.375 V
Lead Channel Pacing Threshold Amplitude: 0.75 V
Lead Channel Pacing Threshold Pulse Width: 0.4 ms
Lead Channel Pacing Threshold Pulse Width: 0.4 ms
Lead Channel Sensing Intrinsic Amplitude: 2.5 mV
Lead Channel Sensing Intrinsic Amplitude: 2.5 mV
Lead Channel Sensing Intrinsic Amplitude: 7.625 mV
Lead Channel Sensing Intrinsic Amplitude: 7.625 mV
Lead Channel Setting Pacing Amplitude: 1.5 V
Lead Channel Setting Pacing Amplitude: 2.5 V
Lead Channel Setting Pacing Pulse Width: 0.4 ms
Lead Channel Setting Sensing Sensitivity: 4 mV
Zone Setting Status: 755011
Zone Setting Status: 755011

## 2024-07-04 ENCOUNTER — Ambulatory Visit: Payer: Self-pay | Admitting: Cardiology

## 2024-08-15 ENCOUNTER — Emergency Department (HOSPITAL_COMMUNITY)
Admission: EM | Admit: 2024-08-15 | Discharge: 2024-08-15 | Disposition: A | Discharge status: Home / Self Care | Attending: Emergency Medicine | Admitting: Emergency Medicine

## 2024-08-15 DIAGNOSIS — Z79899 Other long term (current) drug therapy: Secondary | ICD-10-CM | POA: Insufficient documentation

## 2024-08-15 DIAGNOSIS — R339 Retention of urine, unspecified: Secondary | ICD-10-CM | POA: Diagnosis present

## 2024-08-15 LAB — COMPREHENSIVE METABOLIC PANEL WITH GFR
ALT: 13 U/L (ref 0–44)
AST: 13 U/L — ABNORMAL LOW (ref 15–41)
Albumin: 4.3 g/dL (ref 3.5–5.0)
Alkaline Phosphatase: 98 U/L (ref 38–126)
Anion gap: 14 (ref 5–15)
BUN: 17 mg/dL (ref 8–23)
CO2: 23 mmol/L (ref 22–32)
Calcium: 9.7 mg/dL (ref 8.9–10.3)
Chloride: 100 mmol/L (ref 98–111)
Creatinine, Ser: 0.72 mg/dL (ref 0.61–1.24)
GFR, Estimated: >60 mL/min (ref >60)
Glucose, Bld: 124 mg/dL — ABNORMAL HIGH (ref 70–99)
Potassium: 3.7 mmol/L (ref 3.5–5.1)
Sodium: 137 mmol/L (ref 135–145)
Total Bilirubin: 0.9 mg/dL (ref 0.0–1.2)
Total Protein: 6.2 g/dL — ABNORMAL LOW (ref 6.5–8.1)

## 2024-08-15 LAB — CBC WITH DIFFERENTIAL/PLATELET
Abs Immature Granulocytes: 0.09 K/uL — ABNORMAL HIGH (ref 0.00–0.07)
Basophils Absolute: 0.1 K/uL (ref 0.0–0.1)
Basophils Relative: 0 %
Eosinophils Absolute: 0.1 K/uL (ref 0.0–0.5)
Eosinophils Relative: 1 %
HCT: 37.3 % — ABNORMAL LOW (ref 39.0–52.0)
Hemoglobin: 12 g/dL — ABNORMAL LOW (ref 13.0–17.0)
Immature Granulocytes: 1 %
Lymphocytes Relative: 6 %
Lymphs Abs: 0.8 K/uL (ref 0.7–4.0)
MCH: 29.2 pg (ref 26.0–34.0)
MCHC: 32.2 g/dL (ref 30.0–36.0)
MCV: 90.8 fL (ref 80.0–100.0)
Monocytes Absolute: 0.7 K/uL (ref 0.1–1.0)
Monocytes Relative: 5 %
Neutro Abs: 11.5 K/uL — ABNORMAL HIGH (ref 1.7–7.7)
Neutrophils Relative %: 87 %
Platelets: 180 K/uL (ref 150–400)
RBC: 4.11 MIL/uL — ABNORMAL LOW (ref 4.22–5.81)
RDW: 13.6 % (ref 11.5–15.5)
WBC: 13.2 K/uL — ABNORMAL HIGH (ref 4.0–10.5)
nRBC: 0 % (ref 0.0–0.2)

## 2024-08-15 LAB — URINALYSIS, ROUTINE W REFLEX MICROSCOPIC
Bacteria, UA: NONE SEEN
Bilirubin Urine: NEGATIVE
Glucose, UA: NEGATIVE mg/dL
Ketones, ur: 5 mg/dL — AB
Leukocytes,Ua: NEGATIVE
Nitrite: NEGATIVE
Protein, ur: NEGATIVE mg/dL
Specific Gravity, Urine: 1.015 (ref 1.005–1.030)
pH: 7 (ref 5.0–8.0)

## 2024-08-15 NOTE — Discharge Instructions (Addendum)
 You were seen in the emergency department today for evaluation of your urinary retention.  I think this is likely due to the medication you are on.  Please discontinue the new medication.  Make sure that you follow-up with your urologist as you will need blood called a voiding trial that we discussed earlier.  Again, it is a very important to help with urology and you will need to schedule an appointment.  I have included information on catheter care into the discharge paperwork for you to review.  Make sure that you are draining the catheter and keeping the area clean.  If you have any concerns, new or worsening symptoms, please return to your nearest emergency department for reevaluation.  Contact a health care provider if: Your catheter starts to leak. Your bladder feels full. You have tiredness (lethargy), excessive sleepiness, or confusion. You have signs of a UTI, such as: A fever or chills. Pain in your abdomen, legs, lower back, or bladder. Get help right away if: Your pee is pink or red or you see blood in the catheter. Your pee is not draining into the bag. Your catheter gets pulled out.

## 2024-08-15 NOTE — ED Notes (Signed)
 Pt was given leg bag for home catheter use. Pt also educated on how to keep catheter tubing clean and dry.

## 2024-08-15 NOTE — ED Provider Triage Note (Signed)
 Emergency Medicine Provider Triage Evaluation Note  Jeremy Abbott , a 78 y.o. male  was evaluated in triage.  Pt complains of urinary retention since yesterday.  Patient was started on new BPH medication a few days prior.  He reports he started noticing that he was having fewer urinary episodes.  Has not urinated for the past 6 hours.  Having penile pressure report for a 10.  No nausea, vomiting, abdominal pain, or fever.  Review of Systems  Positive:  Negative:   Physical Exam  BP 139/72 (BP Location: Right Arm)   Pulse 100   Temp 98.2 F (36.8 C) (Oral)   Resp 18   SpO2 97%  Gen:   Awake, no distress   Resp:  Normal effort  MSK:   Moves extremities without difficulty  Other:  standing  Medical Decision Making  Medically screening exam initiated at 6:44 PM.  Appropriate orders placed.  Jeremy Abbott was informed that the remainder of the evaluation will be completed by another provider, this initial triage assessment does not replace that evaluation, and the importance of remaining in the ED until their evaluation is complete.  Bladder scan, labs, and Foley catheter order placed   Bernis Ernst, [place] 08/15/24 1845

## 2024-08-15 NOTE — ED Provider Notes (Signed)
  Physical Exam  BP 139/72 (BP Location: Right Arm)   Pulse 100   Temp 98.2 F (36.8 C) (Oral)   Resp 18   SpO2 97%   Physical Exam  Procedures  Procedures  ED Course / MDM    Medical Decision Making Amount and/or Complexity of Data Reviewed Labs: ordered.   Finesteride 5 days ago Feeling obstructive symptoms today No history of same Foley in now, 566 urine in bladder  Review UA.  UA without evidence infection. Patient and wife updated. Foley rechecked and continued to drain. Abdomen remains nontender. Blood studies reviewed with the patient and spouse. He has established care with urology, Dr. Alvaro, and will follow up in officer.        Odell Balls, PA-C 08/15/24 2142    Mannie Pac T, DO 08/16/24 1644

## 2024-08-15 NOTE — ED Provider Notes (Signed)
 [place] EMERGENCY DEPARTMENT AT [facility] Provider Note   CSN: [id number] Arrival date & time: 08/15/24  1757     Patient presents with: Urinary Retention   Jeremy Abbott is a 78 y.o. male with h/o BPH presents to the emerged from today for evaluation of 2 days of urinary retention.  Patient ports the last time he urinated was 5 to 6 hours ago.  Feels that he has been having trouble getting urine out.  He was recently started on a new BPH medication, finasteride, by Dr. Alvaro with [facility] 5 days ago.  He reports he is been having obstructive symptoms since it was done and is medication related.  Denies any belly pain, nausea, vomiting, hematuria, fevers, or flank pain.  Has not had this issue previously before. He stopped taking the new medication yesterday.   HPI     Prior to Admission medications   Medication Sig Start Date End Date Taking? Authorizing Provider  amLODipine  (NORVASC ) 2.5 MG tablet Take 1 tablet (2.5 mg total) by mouth daily. 11/30/22   Fernande Elspeth BROCKS, MD  amphetamine -dextroamphetamine  (ADDERALL XR) 25 MG 24 hr capsule Take 25 mg by mouth every morning.    [provider]  aspirin -acetaminophen -caffeine  (EXCEDRIN  MIGRAINE) 250-250-65 MG per tablet Take 2 tablets by mouth every 6 (six) hours as needed for headache.    [provider]  atorvastatin (LIPITOR) 20 MG tablet Take 20 mg by mouth daily. 10/28/22   [provider]  buPROPion (WELLBUTRIN XL) 150 MG 24 hr tablet Take 150 mg by mouth every morning. 10/28/22   [provider]  furosemide  (LASIX ) 40 MG tablet Take one tablet by mouth every other day x 4 doses then stop. 12/06/23   Fernande Elspeth BROCKS, MD  HYDROcodone -acetaminophen  (NORCO) 7.5-325 MG tablet Take 1 tablet by mouth daily as needed (pain).     [provider]  metFORMIN  (GLUCOPHAGE -XR) 500 MG 24 hr tablet Take 1,000 mg by mouth every morning.     [provider]  Multiple  Vitamins-Minerals (ZINC PO) Take 1 tablet by mouth daily.    [provider]  olmesartan -hydrochlorothiazide  (BENICAR  HCT) 40-12.5 MG tablet Take 1 tablet by mouth daily. 01/29/23   Fernande Elspeth BROCKS, MD  tadalafil (CIALIS) 5 MG tablet Take 5 mg by mouth daily. 11/28/22   [provider]  tamsulosin  (FLOMAX ) 0.4 MG CAPS capsule Take 0.4 mg by mouth daily.    [provider]    Allergies: Amitriptyline  hcl and No known allergies    Review of Systems  Constitutional:  Negative for chills and fever.  Gastrointestinal:  Negative for abdominal pain, diarrhea, nausea and vomiting.  Genitourinary:  Positive for difficulty urinating. Negative for dysuria, flank pain, hematuria, penile discharge, penile pain, penile swelling, scrotal swelling and testicular pain.    Updated Vital Signs BP 139/72 (BP Location: Right Arm)   Pulse 100   Temp 98.2 F (36.8 C) (Oral)   Resp 18   SpO2 97%   Physical Exam Vitals and nursing note reviewed.  Constitutional:      General: He is not in acute distress.    Appearance: He is not ill-appearing or toxic-appearing.  Eyes:     General: No scleral icterus. Cardiovascular:     Rate and Rhythm: Normal rate.  Pulmonary:     Effort: Pulmonary effort is normal. No respiratory distress.  Abdominal:     Palpations: Abdomen is soft.     Tenderness: There is no  abdominal tenderness. There is no guarding or rebound.  Skin:    General: Skin is warm and dry.  Neurological:     Mental Status: He is alert.     (all labs ordered are listed, but only abnormal results are displayed) Labs Reviewed  URINE CULTURE  CBC WITH DIFFERENTIAL/PLATELET  COMPREHENSIVE METABOLIC PANEL WITH GFR  URINALYSIS, ROUTINE W REFLEX MICROSCOPIC    EKG: None  Radiology: No results found.  Procedures   Medications Ordered in the ED - No data to display                             Medical Decision Making Amount and/or Complexity of Data  Reviewed Labs: ordered.   78 y.o. male presents to the ER for evaluation of urinary retention. Differential diagnosis includes but is not limited to medication related, BPH, UTI. Vital signs unremarkable. Physical exam as noted above.   Bladder scan shows , foley catheter placed. Patient reports relief of symptoms. Will still obtain labs.   Labs are pending. Will hand off to next shift, Upstill PA-C.  8:15 PM Care of DSEAN VANTOL  transferred to PA Upstill at the end of my shift as the patient will require reassessment once labs/imaging have resulted. Patient presentation, ED course, and plan of care discussed with review of all pertinent labs and imaging. Please see his/her note for further details regarding further ED course and disposition. Plan at time of handoff is follow up with labs, treat as necessary, needs to follow up with Urology. This may be altered or completely changed at the discretion of the oncoming team pending results of further workup.  Portions of this report may have been transcribed using voice recognition software. Every effort was made to ensure accuracy; however, inadvertent computerized transcription errors may be present.    Final diagnoses:  None    ED Discharge Orders     None          Bernis Ernst, [place] 08/15/24 2019    Patsey Lot, MD 08/15/24 2256

## 2024-08-15 NOTE — ED Triage Notes (Signed)
 Pt states that he recently had medication changes d/t urinary incontinence from BPH. Pt states that since yesterday he's been having retention issues and inability to empty his bladder. Last urination approximately 4 hours ago.

## 2024-08-17 LAB — URINE CULTURE: Culture: NO GROWTH

## 2024-08-28 ENCOUNTER — Telehealth: Payer: Self-pay | Admitting: Cardiology

## 2024-08-28 ENCOUNTER — Telehealth: Payer: Self-pay

## 2024-08-28 ENCOUNTER — Other Ambulatory Visit: Payer: Self-pay | Admitting: Urology

## 2024-08-28 NOTE — Telephone Encounter (Signed)
   Name: Jeremy Abbott  DOB: 11-May-1946  MRN: [id number]  Primary Cardiologist: None   Preoperative team, please contact this patient and set up a phone call appointment for further preoperative risk assessment. Please obtain consent and complete medication review. Thank you for your help.  I confirm that guidance regarding antiplatelet and oral anticoagulation therapy has been completed and, if necessary, noted below.  Per office protocol, if patient is without any new symptoms or concerns at the time of their virtual visit, he may hold ASA for 7 days prior to procedure. Please resume ASA as soon as possible postprocedure, at the discretion of the surgeon.    I also confirmed the patient resides in the state of [place] . As per [facility] Medical Board telemedicine laws, the patient must reside in the state in which the provider is licensed.   Lamarr Satterfield, NP 08/28/2024, 4:54 PM [facility]

## 2024-08-28 NOTE — Telephone Encounter (Signed)
 Appointment scheduled med rec, consent done

## 2024-08-28 NOTE — Telephone Encounter (Signed)
  Patient Consent for Virtual Visit        Jeremy Abbott has provided verbal consent on 08/28/2024 for a virtual visit (video or telephone).   CONSENT FOR VIRTUAL VISIT FOR:  Jeremy Abbott  By participating in this virtual visit I agree to the following:  I hereby voluntarily request, consent and authorize [facility] and its employed or contracted physicians, physician assistants, nurse practitioners or other licensed health care professionals (the Practitioner), to provide me with telemedicine health care services (the "Services) as deemed necessary by the treating Practitioner. I acknowledge and consent to receive the Services by the Practitioner via telemedicine. I understand that the telemedicine visit will involve communicating with the Practitioner through live audiovisual communication technology and the disclosure of certain medical information by electronic transmission. I acknowledge that I have been given the opportunity to request an in-person assessment or other available alternative prior to the telemedicine visit and am voluntarily participating in the telemedicine visit.  I understand that I have the right to withhold or withdraw my consent to the use of telemedicine in the course of my care at any time, without affecting my right to future care or treatment, and that the Practitioner or I may terminate the telemedicine visit at any time. I understand that I have the right to inspect all information obtained and/or recorded in the course of the telemedicine visit and may receive copies of available information for a reasonable fee.  I understand that some of the potential risks of receiving the Services via telemedicine include:  Delay or interruption in medical evaluation due to technological equipment failure or disruption; Information transmitted may not be sufficient (e.g. poor resolution of images) to allow for appropriate medical decision making by the  Practitioner; and/or  In rare instances, security protocols could fail, causing a breach of personal health information.  Furthermore, I acknowledge that it is my responsibility to provide information about my medical history, conditions and care that is complete and accurate to the best of my ability. I acknowledge that Practitioner's advice, recommendations, and/or decision may be based on factors not within their control, such as incomplete or inaccurate data provided by me or distortions of diagnostic images or specimens that may result from electronic transmissions. I understand that the practice of medicine is not an exact science and that Practitioner makes no warranties or guarantees regarding treatment outcomes. I acknowledge that a copy of this consent can be made available to me via my patient portal [facility] MyChart), or I can request a printed copy by calling the office of [facility].    I understand that my insurance will be billed for this visit.   I have read or had this consent read to me. I understand the contents of this consent, which adequately explains the benefits and risks of the Services being provided via telemedicine.  I have been provided ample opportunity to ask questions regarding this consent and the Services and have had my questions answered to my satisfaction. I give my informed consent for the services to be provided through the use of telemedicine in my medical care

## 2024-08-28 NOTE — Telephone Encounter (Signed)
   Pre-operative Risk Assessment    Patient Name: Jeremy Abbott  DOB: 1946/07/03 MRN: [id number]   Date of last office visit: 12/06/23 Date of next office visit: TBD   Request for Surgical Clearance    Procedure:  Resection of prostate  Date of Surgery:  Clearance 10/09/24                                Surgeon:  Dr. Ricardo Likens Surgeon's Group or Practice Name:  [facility] Phone number:  [phone] Fax number:  [phone]   Type of Clearance Requested:   - Medical  - Pharmacy:  Hold Defer to cards  tbd   Type of Anesthesia:  General    Additional requests/questions:  No  Signed, Lauraine KANDICE Ada   08/28/2024, 4:39 PM

## 2024-09-07 ENCOUNTER — Ambulatory Visit (INDEPENDENT_AMBULATORY_CARE_PROVIDER_SITE_OTHER)

## 2024-09-07 DIAGNOSIS — I4719 Other supraventricular tachycardia: Secondary | ICD-10-CM | POA: Diagnosis not present

## 2024-09-09 LAB — CUP PACEART REMOTE DEVICE CHECK
Battery Remaining Longevity: 17 mo
Battery Voltage: 2.91 V
Brady Statistic AP VP Percent: 30.21 %
Brady Statistic AP VS Percent: 0.01 %
Brady Statistic AS VP Percent: 69.7 %
Brady Statistic AS VS Percent: 0.08 %
Brady Statistic RA Percent Paced: 30.19 %
Brady Statistic RV Percent Paced: 99.85 %
Date Time Interrogation Session: 20251006132344
Implantable Lead Connection Status: 753985
Implantable Lead Connection Status: 753985
Implantable Lead Implant Date: 20160715
Implantable Lead Implant Date: 20171205
Implantable Lead Location: 753859
Implantable Lead Location: 753860
Implantable Lead Model: 5076
Implantable Lead Model: 5076
Implantable Pulse Generator Implant Date: 20171205
Lead Channel Impedance Value: 342 Ohm
Lead Channel Impedance Value: 437 Ohm
Lead Channel Impedance Value: 513 Ohm
Lead Channel Impedance Value: 551 Ohm
Lead Channel Pacing Threshold Amplitude: 0.375 V
Lead Channel Pacing Threshold Amplitude: 0.625 V
Lead Channel Pacing Threshold Pulse Width: 0.4 ms
Lead Channel Pacing Threshold Pulse Width: 0.4 ms
Lead Channel Sensing Intrinsic Amplitude: 2.875 mV
Lead Channel Sensing Intrinsic Amplitude: 2.875 mV
Lead Channel Sensing Intrinsic Amplitude: 8.125 mV
Lead Channel Sensing Intrinsic Amplitude: 8.125 mV
Lead Channel Setting Pacing Amplitude: 1.5 V
Lead Channel Setting Pacing Amplitude: 2.5 V
Lead Channel Setting Pacing Pulse Width: 0.4 ms
Lead Channel Setting Sensing Sensitivity: 4 mV
Zone Setting Status: 755011
Zone Setting Status: 755011

## 2024-09-10 ENCOUNTER — Ambulatory Visit: Attending: Cardiovascular Disease

## 2024-09-10 ENCOUNTER — Encounter: Payer: Self-pay | Admitting: Cardiology

## 2024-09-10 DIAGNOSIS — Z01810 Encounter for preprocedural cardiovascular examination: Secondary | ICD-10-CM

## 2024-09-10 NOTE — Progress Notes (Signed)
 Remote PPM Transmission

## 2024-09-10 NOTE — Telephone Encounter (Signed)
 Forwarding to device clinic for review and to provide recommendations.  Jeremy Abbott. Jes Costales NP-C     09/10/2024, 7:43 AM [facility] [address] [place], [place] [address] Office [phone]

## 2024-09-10 NOTE — Telephone Encounter (Signed)
 See separate encounter for device clearance.

## 2024-09-10 NOTE — Progress Notes (Signed)
 PERIOPERATIVE PRESCRIPTION FOR IMPLANTED CARDIAC DEVICE PROGRAMMING  Patient Information: Name:  CLESTON LAUTNER  DOB:  1946/07/09  MRN:  [id number]   Request for Surgical Clearance    Procedure:  Resection of prostate   Date of Surgery:  Clearance 10/09/24                                  Surgeon:  Dr. Ricardo Likens Surgeon's Group or Practice Name:  [facility] Phone number:  [phone] Fax number:  [phone]  Device Information:  Clinic EP Physician:  Soyla Norton, MD   Device Type:  Pacemaker Manufacturer and Phone #:  Medtronic: [id number] Pacemaker Dependent?:  Yes.   Date of Last Device Check:  12/06/23 IN OFFICE, 09/07/24 REMOTE Normal Device Function?:  Yes.    Electrophysiologist's Recommendations:  Have magnet available. Provide continuous ECG monitoring when magnet is used or reprogramming is to be performed.  Procedure will likely interfere with device function.  Device should be programmed:  Asynchronous pacing during procedure and returned to normal programming after procedure  Per Device Clinic Standing Orders, Alan JAYSON Fees, RN  11:50 AM 09/10/2024

## 2024-09-10 NOTE — Progress Notes (Signed)
 Virtual Visit via Telephone Note   Because of [name] co-morbid illnesses, he is at least at moderate risk for complications without adequate follow up.  This format is felt to be most appropriate for this patient at this time.  Due to technical limitations with video connection (technology), today's appointment will be conducted as an audio only telehealth visit, and Jeremy Abbott verbally agreed to proceed in this manner.   All issues noted in this document were discussed and addressed.  No physical exam could be performed with this format.  Evaluation Performed:  Preoperative cardiovascular risk assessment _____________   Date:  09/10/2024   Patient ID:  Jeremy Abbott, DOB August 08, 1946, MRN [id number] Patient Location:  Home Provider location:   Office  Primary Care Provider:  Gib Charleston, MD Primary Cardiologist:  None  Chief Complaint / Patient Profile   78 y.o. y/o male with a h/o hypertension, complete heart block, type 2 diabetes who is pending TURP and presents today for telephonic preoperative cardiovascular risk assessment.  History of Present Illness    Jeremy Abbott is a 78 y.o. male who presents via audio/video conferencing for a telehealth visit today.  Pt was last seen in cardiology clinic on 12/06/2023 by Dr. Fernande.  At that time Jeremy Abbott was doing well .  The patient is now pending procedure as outlined above. Since his last visit, he remains stable from a cardiac standpoint.  Today he denies chest pain, shortness of breath, lower extremity edema, fatigue, palpitations, melena, hematuria, hemoptysis, diaphoresis, weakness, presyncope, syncope, orthopnea, and PND.   Past Medical History    Past Medical History:  Diagnosis Date   ADHD (attention deficit hyperactivity disorder)    Adjustment disorder with anxiety    Allergic rhinitis    Anxiety    Complete heart block (HCC)    a. s/p Medtronic PPM 06/17/15 (device dependent).   Depression     Diabetes mellitus (HCC)    Essential hypertension    Fracture of right clavicle 11/09/2009   Chronic right collarbone pain-non union   Headache    Heart murmur    at one time, not significant per pt   Hyperlipidemia    Hypokalemia    NSAID long-term use    Presence of permanent cardiac pacemaker    RECTAL BLEEDING 01/22/2008   Past Surgical History:  Procedure Laterality Date   COLONOSCOPY     EP IMPLANTABLE DEVICE N/A 06/17/2015   Procedure: Pacemaker Implant;  Surgeon: Elspeth JAYSON Fernande, MD;  Location: [facility] INVASIVE CV LAB;  Service: Cardiovascular;  Laterality: N/A;   EP IMPLANTABLE DEVICE Right 11/06/2016   Procedure: INSERTION of new right ventricle pacemaker lead  and insertion of new pacemaker;  Surgeon: Danelle LELON Birmingham, MD;  Location[facility] OR;  Service: Cardiovascular;  Laterality: Right;   ORIF CLAVICLE FRACTURE  2/08   s/p collarbone fx with Fall-Murphy Wainer   PACEMAKER LEAD REMOVAL Left 11/06/2016   Procedure: PACEMAKER LEAD EXTRACTION;  Surgeon: Danelle LELON Birmingham, MD;  Location: [facility] OR;  Service: Cardiovascular;  Laterality: Left;  DR. ARMY TO BACKUP CASE   TEE WITHOUT CARDIOVERSION N/A 11/06/2016   Procedure: TRANSESOPHAGEAL ECHOCARDIOGRAM (TEE);  Surgeon: Danelle LELON Birmingham, MD;  Location: [facility] OR;  Service: Cardiovascular;  Laterality: N/A;   TONSILLECTOMY      Allergies  Allergies  Allergen Reactions   Amitriptyline  Hcl Other (See Comments)    Sleeping for long periods of time   No Known Allergies  Home Medications    Prior to Admission medications   Medication Sig Start Date End Date Taking? Authorizing Provider  amLODipine  (NORVASC ) 2.5 MG tablet Take 1 tablet (2.5 mg total) by mouth daily. 11/30/22   Fernande Elspeth BROCKS, MD  amphetamine -dextroamphetamine  (ADDERALL XR) 25 MG 24 hr capsule Take 25 mg by mouth every morning.    [provider]  aspirin -acetaminophen -caffeine  (EXCEDRIN  MIGRAINE) 250-250-65 MG per tablet Take 2 tablets by mouth every 6 (six) hours as  needed for headache.    [provider]  atorvastatin (LIPITOR) 20 MG tablet Take 20 mg by mouth daily. 10/28/22   [provider]  buPROPion (WELLBUTRIN XL) 150 MG 24 hr tablet Take 150 mg by mouth every morning. 10/28/22   [provider]  furosemide  (LASIX ) 40 MG tablet Take one tablet by mouth every other day x 4 doses then stop. 12/06/23   Fernande Elspeth BROCKS, MD  HYDROcodone -acetaminophen  (NORCO) 7.5-325 MG tablet Take 1 tablet by mouth daily as needed (pain).     [provider]  metFORMIN  (GLUCOPHAGE -XR) 500 MG 24 hr tablet Take 1,000 mg by mouth every morning.     [provider]  Multiple Vitamins-Minerals (ZINC PO) Take 1 tablet by mouth daily.    [provider]  olmesartan -hydrochlorothiazide  (BENICAR  HCT) 40-12.5 MG tablet Take 1 tablet by mouth daily. 01/29/23   Fernande Elspeth BROCKS, MD  tadalafil (CIALIS) 5 MG tablet Take 5 mg by mouth daily. 11/28/22   [provider]  tamsulosin  (FLOMAX ) 0.4 MG CAPS capsule Take 0.4 mg by mouth daily.    [provider]    Physical Exam    Vital Signs:  Jeremy Abbott does not have vital signs available for review today.  Given telephonic nature of communication, physical exam is limited. AAOx3. NAD. Normal affect.  Speech and respirations are unlabored.  Accessory Clinical Findings    None  Assessment & Plan    1.  Preoperative Cardiovascular Risk Assessment:Resection of prostate   Date of Surgery:  Clearance 10/09/24                                  Surgeon:  Dr. Ricardo Likens Surgeon's Group or Practice Name:  [facility] Phone number:  [phone] Fax number:  [phone]      Primary Cardiologist: None  Chart reviewed as part of pre-operative protocol coverage. Given past medical history and time since last visit, based on ACC/AHA guidelines, Jeremy Abbott would be at acceptable risk for the planned procedure without further cardiovascular  testing.   His RCRI is low risk, 0.9% risk of major cardiac event.  He is able to complete greater than 4 METS of physical activity.  Patient was advised that if he develops new symptoms prior to surgery to contact our office to arrange a follow-up appointment.  He verbalized understanding.  Per office protocol, if patient is without any new symptoms or concerns at the time of their virtual visit, he may hold ASA for 7 days prior to procedure. Please resume ASA as soon as possible postprocedure, at the discretion of the surgeon.   Patient's clearance request was also forwarded to device clinic for review and recommendations.  I will route this recommendation to the requesting party via Epic fax function and remove from pre-op pool.       Time:   Today, I have spent  12 minutes with  the patient with telehealth technology discussing medical history, symptoms, and management plan.  I spent 10 minutes reviewing patient's past cardiac history and cardiac medications.    Josefa CHRISTELLA Beauvais, NP  09/10/2024, 7:36 AM

## 2024-09-11 ENCOUNTER — Ambulatory Visit: Payer: Self-pay | Admitting: Cardiology

## 2024-09-14 NOTE — Progress Notes (Signed)
 Remote PPM Transmission

## 2024-09-25 NOTE — Patient Instructions (Addendum)
 SURGICAL WAITING ROOM VISITATION  Patients having surgery or a procedure may have no more than 2 support people in the waiting area - these visitors may rotate.    Children under the age of 70 must have an adult with them who is not the patient.  Visitors with respiratory illnesses are discouraged from visiting and should remain at home.  If the patient needs to stay at the hospital during part of their recovery, the visitor guidelines for inpatient rooms apply. Pre-op nurse will coordinate an appropriate time for 1 support person to accompany patient in pre-op.  This support person may not rotate.    Please refer to the [facility] website for the visitor guidelines for Inpatients (after your surgery is over and you are in a regular room).       Your procedure is scheduled on: 10/09/24   Report to [facility] Main Entrance    Report to admitting at 1:30 PM   Call this number if you have problems the morning of surgery [phone]   Do not eat food or drink liquids :After Midnight.but may have sips of water to take meds.     Oral Hygiene is also important to reduce your risk of infection.                                    Remember - BRUSH YOUR TEETH THE MORNING OF SURGERY WITH YOUR REGULAR TOOTHPASTE  DENTURES WILL BE REMOVED PRIOR TO SURGERY PLEASE DO NOT APPLY Poly grip OR ADHESIVES!!!     Stop all vitamins and herbal supplements 7 days before surgery.   Take these medicines the morning of surgery with A SIP OF WATER: amlodipine , atorvastatin, wellbutrin, hydrocodone , tamsulosin .              Do not take Lasix  or Benicar (olmesartan /hydrochlorothiazide ) the morning of surgery.  DO NOT TAKE ANY ORAL DIABETIC MEDICATIONS DAY OF YOUR SURGERY Hold Metformin  the morning of surgery.                               You may not have any metal on your body including hair pins, jewelry, and body piercing             Do not wear make-up, lotions, powders, perfumes/cologne,  or deodorant              Men may shave face and neck.   Do not bring valuables to the hospital. [facility] IS NOT             RESPONSIBLE   FOR VALUABLES.   Contacts, glasses, dentures or bridgework may not be worn into surgery.   Bring small overnight bag day of surgery.   DO NOT BRING YOUR HOME MEDICATIONS TO THE HOSPITAL. PHARMACY WILL DISPENSE MEDICATIONS LISTED ON YOUR MEDICATION LIST TO YOU DURING YOUR ADMISSION IN THE HOSPITAL!    Patients discharged on the day of surgery will not be allowed to drive home.  Someone NEEDS to stay with you for the first 24 hours after anesthesia.   Special Instructions: Bring a copy of your healthcare power of attorney and living will documents the day of surgery if you haven't scanned them before.              Please read over the following fact sheets you were given: IF YOU  HAVE QUESTIONS ABOUT YOUR PRE-OP INSTRUCTIONS PLEASE CALL [phone] [place]   If you received a COVID test during your pre-op visit  it is requested that you wear a mask when out in public, stay away from anyone that may not be feeling well and notify your surgeon if you develop symptoms. If you test positive for Covid or have been in contact with anyone that has tested positive in the last 10 days please notify you surgeon.    [facility] - Preparing for Surgery Before surgery, you can play an important role.  Because skin is not sterile, your skin needs to be as free of germs as possible.  You can reduce the number of germs on your skin by washing with CHG (chlorahexidine gluconate) soap before surgery.  CHG is an antiseptic cleaner which kills germs and bonds with the skin to continue killing germs even after washing. Please DO NOT use if you have an allergy to CHG or antibacterial soaps.  If your skin becomes reddened/irritated stop using the CHG and inform your nurse when you arrive at Short Stay. Do not shave (including legs and underarms) for at least 48 hours prior to  the first CHG shower.  You may shave your face/neck.  Please follow these instructions carefully:  1.  Shower with CHG Soap the night before surgery and the morning of surgery.  2.  If you choose to wash your hair, wash your hair first as usual with your normal  shampoo.  3.  After you shampoo, rinse your hair and body thoroughly to remove the shampoo.                             4.  Use CHG as you would any other liquid soap.  You can apply chg directly to the skin and wash.  Gently with a scrungie or clean washcloth.  5.  Apply the CHG Soap to your body ONLY FROM THE NECK DOWN.   Do   not use on face/ open                           Wound or open sores. Avoid contact with eyes, ears mouth and   genitals (private parts).                       Wash face,  Genitals (private parts) with your normal soap.             6.  Wash thoroughly, paying special attention to the area where your    surgery  will be performed.  7.  Thoroughly rinse your body with warm water from the neck down.  8.  DO NOT shower/wash with your normal soap after using and rinsing off the CHG Soap.                9.  Pat yourself dry with a clean towel.            10.  Wear clean pajamas.            11.  Place clean sheets on your bed the night of your first shower and do not  sleep with pets. Day of Surgery : Do not apply any CHG, lotions/deodorants the morning of surgery.  Please wear clean clothes to the hospital/surgery center.  FAILURE TO FOLLOW THESE INSTRUCTIONS MAY RESULT IN THE CANCELLATION OF  YOUR SURGERY  PATIENT SIGNATURE_________________________________  NURSE SIGNATURE__________________________________  ________________________________________________________________________

## 2024-09-25 NOTE — Progress Notes (Signed)
 COVID Vaccine received:  []  No []  Yes Date of any COVID positive Test in last 90 days:  PCP - Lamar Quarry MD Cardiologist -  EP Doctor- Soyla Norton MD Cardiac clearance-09/10/24-Jesse Cleaver NP  Chest x-ray - 05/19/24 Epic EKG -  12/06/23 Epic Stress Test -  ECHO - 01/01/22 Epic Cardiac Cath -   Bowel Prep - []  No  []   Yes ______  Pacemaker / ICD device []  No []  Yes   Spinal Cord Stimulator:[]  No []  Yes       History of Sleep Apnea? []  No []  Yes   CPAP used?- []  No []  Yes    Does the patient monitor blood sugar?          []  No []  Yes  []  N/A  Patient has: []  NO Hx DM   []  Pre-DM                 []  DM1  []   DM2 Does patient have a [employer] or [employer]? []  No []  Yes   Fasting Blood Sugar Ranges-  Checks Blood Sugar _____ times a day  GLP1 agonist / usual dose -  GLP1 instructions:  SGLT-2 inhibitors / usual dose -  SGLT-2 instructions:   Blood Thinner / Instructions: Aspirin  Instructions:  Comments:   Activity level: Patient is able / unable to climb a flight of stairs without difficulty; []  No CP  []  No SOB, but would have ___   Patient can / can not perform ADLs without assistance.   Anesthesia review:   Patient denies shortness of breath, fever, cough and chest pain at PAT appointment.  Patient verbalized understanding and agreement to the Pre-Surgical Instructions that were given to them at this PAT appointment. Patient was also educated of the need to review these PAT instructions again prior to his/her surgery.I reviewed the appropriate phone numbers to call if they have any and questions or concerns.

## 2024-09-28 ENCOUNTER — Encounter (HOSPITAL_COMMUNITY)
Admission: RE | Admit: 2024-09-28 | Discharge: 2024-09-28 | Disposition: A | Discharge status: Home / Self Care | Source: Ambulatory Visit | Attending: Anesthesiology | Admitting: Anesthesiology

## 2024-09-28 DIAGNOSIS — E119 Type 2 diabetes mellitus without complications: Secondary | ICD-10-CM

## 2024-10-06 NOTE — Patient Instructions (Addendum)
 SURGICAL WAITING ROOM VISITATION Patients having surgery or a procedure may have no more than 2 support people in the waiting area - these visitors may rotate in the visitor waiting room.   Due to an increase in RSV and influenza rates and associated hospitalizations, children ages 29 and under may not visit patients in [facility] hospitals. If the patient needs to stay at the hospital during part of their recovery, the visitor guidelines for inpatient rooms apply.  PRE-OP VISITATION  Pre-op nurse will coordinate an appropriate time for 1 support person to accompany the patient in pre-op.  This support person may not rotate.  This visitor will be contacted when the time is appropriate for the visitor to come back in the pre-op area.  Please refer to the [facility] website for the visitor guidelines for Inpatients (after your surgery is over and you are in a regular room).  You are not required to quarantine at this time prior to your surgery. However, you must do this: Hand Hygiene often Do NOT share personal items Notify your provider if you are in close contact with someone who has COVID or you develop fever 100.4 or greater, new onset of sneezing, cough, sore throat, shortness of breath or body aches.  If you test positive for Covid or have been in contact with anyone that has tested positive in the last 10 days please notify you surgeon.    Your procedure is scheduled on:  10/09/24  Report to [facility] Main Entrance: [place] entrance where the [employer] is available.   Report to admitting at: 1:15 PM  Call this number if you have any questions or problems the morning of surgery [phone]  FOLLOW ANY ADDITIONAL PRE OP INSTRUCTIONS YOU RECEIVED FROM YOUR SURGEON'S OFFICE!!!  Do not eat food or drink fluids after Midnight the night prior to your surgery/procedure.  After Midnight you may have the following liquids until : 12:30 PM DAY OF SURGERY  Clear Liquid  Diet Water Black Coffee (sugar ok, NO MILK/CREAM OR CREAMERS)  Tea (sugar ok, NO MILK/CREAM OR CREAMERS) regular and decaf                             Plain Jell-O  with no fruit (NO RED)                                           Fruit ices (not with fruit pulp, NO RED)                                     Popsicles (NO RED)                                                                  Juice: NO CITRUS JUICES: only apple, WHITE grape, WHITE cranberry Sports drinks like Gatorade or Powerade (NO RED)   Oral Hygiene is also important to reduce your risk of infection.        Remember - BRUSH YOUR TEETH THE MORNING OF SURGERY  WITH YOUR REGULAR TOOTHPASTE  Do NOT smoke after Midnight the night before surgery.  STOP TAKING all Vitamins, Herbs and supplements 1 week before your surgery.   Take ONLY these medicines the morning of surgery with A SIP OF WATER: amlodipine ,Wellbutrin,tamsulosin .                  You may not have any metal on your body including hair pins, jewelry, and body piercing  Do not wear make-up, lotions, powders, perfumes / cologne, or deodorant  Do not wear nail polish including gel and S&S, artificial / acrylic nails, or any other type of covering on natural nails including finger and toenails. If you have artificial nails, gel coating, etc., that needs to be removed by a nail salon, Please have this removed prior to surgery. Not doing so may mean that your surgery could be cancelled or delayed if the Surgeon or anesthesia staff feels like they are unable to monitor you safely.   Do not shave 48 hours prior to surgery to avoid nicks in your skin which may contribute to postoperative infections.   Men may shave face and neck.  Contacts, Hearing Aids, dentures or bridgework may not be worn into surgery. DENTURES WILL BE REMOVED PRIOR TO SURGERY PLEASE DO NOT APPLY Poly grip OR ADHESIVES!!!  You may bring a small overnight bag with you on the day of surgery, only pack  items that are not valuable. [facility] IS NOT RESPONSIBLE   FOR VALUABLES THAT ARE LOST OR STOLEN.   Patients discharged on the day of surgery will not be allowed to drive home.  Someone NEEDS to stay with you for the first 24 hours after anesthesia.  Do not bring your home medications to the hospital. The Pharmacy will dispense medications listed on your medication list to you during your admission in the Hospital.  Special Instructions: Bring a copy of your healthcare power of attorney and living will documents the day of surgery, if you wish to have them scanned into your [facility] Records- EPIC  Please read over the following fact sheets you were given: IF YOU HAVE QUESTIONS ABOUT YOUR PRE-OP INSTRUCTIONS, PLEASE CALL [phone]   [facility] - Preparing for Surgery      Before surgery, you can play an important role.  Because skin is not sterile, your skin needs to be as free of germs as possible.  You can reduce the number of germs on your skin by washing with CHG (chlorahexidine gluconate) soap before surgery.  CHG is an antiseptic cleaner which kills germs and bonds with the skin to continue killing germs even after washing. Please DO NOT use if you have an allergy to CHG or antibacterial soaps.  If your skin becomes reddened/irritated stop using the CHG and inform your nurse when you arrive at Short Stay. Do not shave (including legs and underarms) for at least 48 hours prior to the first CHG shower.  You may shave your face/neck.  Please follow these instructions carefully:  1.  Shower with CHG Soap the night before surgery ONLY (DO NOT USE THE SOAP THE MORNING OF SURGERY).  2.  If you choose to wash your hair, wash your hair first as usual with your normal  shampoo.  3.  After you shampoo, rinse your hair and body thoroughly to remove the shampoo.  4.  Use CHG as you would any other liquid soap.  You can apply chg directly to the skin and wash.   Gently with a scrungie or clean washcloth.  5.  Apply the CHG Soap to your body ONLY FROM THE NECK DOWN.   Do not use on face/ open                           Wound or open sores. Avoid contact with eyes, ears mouth and genitals (private parts).                       Wash face,  Genitals (private parts) with your normal soap.             6.  Wash thoroughly, paying special attention to the area where your  surgery  will be performed.  7.  Thoroughly rinse your body with warm water from the neck down.  8.  DO NOT shower/wash with your normal soap after using and rinsing off the CHG Soap.                9.  Pat yourself dry with a clean towel.            10.  Wear clean pajamas.            11.  Place clean sheets on your bed the night of your first shower and do not  sleep with pets.  Day of Surgery : Do not apply any CHG, lotions/deodorants the morning of surgery.  Please wear clean clothes to the hospital/surgery center.   FAILURE TO FOLLOW THESE INSTRUCTIONS MAY RESULT IN THE CANCELLATION OF YOUR SURGERY  PATIENT SIGNATURE_________________________________  NURSE SIGNATURE__________________________________  ________________________________________________________________________ WHAT IS A BLOOD TRANSFUSION? Blood Transfusion Information  A transfusion is the replacement of blood or some of its parts. Blood is made up of multiple cells which provide different functions. Red blood cells carry oxygen and are used for blood loss replacement. White blood cells fight against infection. Platelets control bleeding. Plasma helps clot blood. Other blood products are available for specialized needs, such as hemophilia or other clotting disorders. BEFORE THE TRANSFUSION  Who gives blood for transfusions?  Healthy volunteers who are fully evaluated to make sure their blood is safe. This is blood bank blood. Transfusion therapy is the safest it has ever been in the practice of medicine. Before  blood is taken from a donor, a complete history is taken to make sure that person has no history of diseases nor engages in risky social behavior (examples are intravenous drug use or sexual activity with multiple partners). The donor's travel history is screened to minimize risk of transmitting infections, such as malaria. The donated blood is tested for signs of infectious diseases, such as HIV and hepatitis. The blood is then tested to be sure it is compatible with you in order to minimize the chance of a transfusion reaction. If you or a relative donates blood, this is often done in anticipation of surgery and is not appropriate for emergency situations. It takes many days to process the donated blood. RISKS AND COMPLICATIONS Although transfusion therapy is very safe and saves many lives, the main dangers of transfusion include:  Getting an infectious disease. Developing a transfusion reaction. This is an allergic reaction to something in the blood you were given. Every precaution is taken to prevent this. The decision to have a blood transfusion  has been considered carefully by your caregiver before blood is given. Blood is not given unless the benefits outweigh the risks. AFTER THE TRANSFUSION Right after receiving a blood transfusion, you will usually feel much better and more energetic. This is especially true if your red blood cells have gotten low (anemic). The transfusion raises the level of the red blood cells which carry oxygen, and this usually causes an energy increase. The nurse administering the transfusion will monitor you carefully for complications. HOME CARE INSTRUCTIONS  No special instructions are needed after a transfusion. You may find your energy is better. Speak with your caregiver about any limitations on activity for underlying diseases you may have. SEEK MEDICAL CARE IF:  Your condition is not improving after your transfusion. You develop redness or irritation at the  intravenous (IV) site. SEEK IMMEDIATE MEDICAL CARE IF:  Any of the following symptoms occur over the next 12 hours: Shaking chills. You have a temperature by mouth above 102 F (38.9 C), not controlled by medicine. Chest, back, or muscle pain. People around you feel you are not acting correctly or are confused. Shortness of breath or difficulty breathing. Dizziness and fainting. You get a rash or develop hives. You have a decrease in urine output. Your urine turns a dark color or changes to pink, red, or brown. Any of the following symptoms occur over the next 10 days: You have a temperature by mouth above 102 F (38.9 C), not controlled by medicine. Shortness of breath. Weakness after normal activity. The white part of the eye turns yellow (jaundice). You have a decrease in the amount of urine or are urinating less often. Your urine turns a dark color or changes to pink, red, or brown. Document Released: 11/16/2000 Document Revised: 02/11/2012 Document Reviewed: 07/05/2008 [facility] Patient Information 2014 [place], [place].  _______________________________________________________________________

## 2024-10-07 ENCOUNTER — Encounter (HOSPITAL_COMMUNITY): Payer: Self-pay

## 2024-10-07 ENCOUNTER — Encounter (HOSPITAL_COMMUNITY)
Admission: RE | Admit: 2024-10-07 | Discharge: 2024-10-07 | Disposition: A | Discharge status: Home / Self Care | Source: Ambulatory Visit | Attending: Urology | Admitting: Urology

## 2024-10-07 ENCOUNTER — Other Ambulatory Visit: Payer: Self-pay

## 2024-10-07 VITALS — BP 116/68 | HR 80 | Temp 97.8°F | Ht 69.0 in | Wt 189.0 lb

## 2024-10-07 DIAGNOSIS — Z01812 Encounter for preprocedural laboratory examination: Secondary | ICD-10-CM | POA: Diagnosis present

## 2024-10-07 DIAGNOSIS — Z950 Presence of cardiac pacemaker: Secondary | ICD-10-CM | POA: Insufficient documentation

## 2024-10-07 DIAGNOSIS — E119 Type 2 diabetes mellitus without complications: Secondary | ICD-10-CM | POA: Diagnosis not present

## 2024-10-07 DIAGNOSIS — Z87891 Personal history of nicotine dependence: Secondary | ICD-10-CM | POA: Diagnosis not present

## 2024-10-07 DIAGNOSIS — N138 Other obstructive and reflux uropathy: Secondary | ICD-10-CM | POA: Insufficient documentation

## 2024-10-07 DIAGNOSIS — N401 Enlarged prostate with lower urinary tract symptoms: Secondary | ICD-10-CM | POA: Diagnosis not present

## 2024-10-07 DIAGNOSIS — I10 Essential (primary) hypertension: Secondary | ICD-10-CM | POA: Insufficient documentation

## 2024-10-07 HISTORY — DX: Personal history of urinary calculi: Z87.442

## 2024-10-07 LAB — BASIC METABOLIC PANEL WITH GFR
Anion gap: 10 (ref 5–15)
BUN: 19 mg/dL (ref 8–23)
CO2: 27 mmol/L (ref 22–32)
Calcium: 9.5 mg/dL (ref 8.9–10.3)
Chloride: 104 mmol/L (ref 98–111)
Creatinine, Ser: 0.89 mg/dL (ref 0.61–1.24)
GFR, Estimated: >60 mL/min (ref >60)
Glucose, Bld: 100 mg/dL — ABNORMAL HIGH (ref 70–99)
Potassium: 4.2 mmol/L (ref 3.5–5.1)
Sodium: 141 mmol/L (ref 135–145)

## 2024-10-07 LAB — CBC
HCT: 41.8 % (ref 39.0–52.0)
Hemoglobin: 12.9 g/dL — ABNORMAL LOW (ref 13.0–17.0)
MCH: 28.5 pg (ref 26.0–34.0)
MCHC: 30.9 g/dL (ref 30.0–36.0)
MCV: 92.5 fL (ref 80.0–100.0)
Platelets: 227 K/uL (ref 150–400)
RBC: 4.52 MIL/uL (ref 4.22–5.81)
RDW: 14.5 % (ref 11.5–15.5)
WBC: 10.6 K/uL — ABNORMAL HIGH (ref 4.0–10.5)
nRBC: 0 % (ref 0.0–0.2)

## 2024-10-07 LAB — GLUCOSE, CAPILLARY: Glucose-Capillary: 103 mg/dL — ABNORMAL HIGH (ref 70–99)

## 2024-10-07 NOTE — Progress Notes (Addendum)
 For Anesthesia: PCP - Gib Charleston, MD  Cardiologist - none CLEARANCE: Emelia Josefa HERO, NP  : 09/10/24  Bowel Prep reminder:N/A  Chest x-ray -  EKG - 12/06/23: EPIC Stress Test -  ECHO - 01/01/22 Cardiac Cath -  Pacemaker/ICD device last checked:09/07/24: remote Pacemaker orders received: Yes: EPIC Device Rep notified:Yes: Email  Spinal Cord Stimulator:N/A  Sleep Study - N/A CPAP -   Fasting Blood Sugar - N/A Checks Blood Sugar __0___ times a day Date and result of last Hgb A1c-5.4: 09/10/24: Chart  Last dose of GLP1 agonist- N/A GLP1 instructions: Hold 7 days prior to schedule (Hold 24 hours-daily)   Last dose of SGLT-2 inhibitors- N/A SGLT-2 instructions: Hold 72 hours prior to surgery  Blood Thinner Instructions:N/A Last Dose: Time last taken:  Aspirin  Instructions:On hold Last Dose: Time last taken:  Activity level: Can go up a flight of stairs and activities of daily living without stopping and without chest pain and/or shortness of breath   Able to exercise without chest pain and/or shortness of breath  Anesthesia review: Hx: HTN,DIA,Right BBB,NSVT,Murmur.,ADHD,Pt. was [facility] from: 09/26/24 to 09/28/24 for urinary retention and UTI.  Patient denies shortness of breath, fever, cough and chest pain at PAT appointment   Patient verbalized understanding of instructions that were reviewed over the telephone.

## 2024-10-08 NOTE — Progress Notes (Signed)
 Anesthesia Chart Review   Case: [id number] Date/Time: 10/09/24 1518   Procedure: TURP (TRANSURETHRAL RESECTION OF PROSTATE)   Anesthesia type: General   Diagnosis: Enlarged prostate with urinary obstruction [N40.1, N13.8]   Pre-op diagnosis: prostate, retention   Location: [facility] ROOM 05 / [facility]   Surgeons: Alvaro Ricardo KATHEE Mickey., MD       DISCUSSION:78 y.o. former smoker with h/o HTN, CHB, pacemaker in place, DM II, enlarged prostate with urinary obstruction scheduled for above procedure 10/09/2024 with Dr. Ricardo Alvaro.   Device orders in 09/10/2024 progress note, device should be programmed: Asynchronous pacing during procedure and returned to normal programming after procedure. Device rep contacted by PAT nurse.   Per cardiology preoperative evaluation 09/10/2024, Chart reviewed as part of pre-operative protocol coverage. Given past medical history and time since last visit, based on ACC/AHA guidelines, JEZIAH KRETSCHMER would be at acceptable risk for the planned procedure without further cardiovascular testing.  His RCRI is low risk, 0.9% risk of major cardiac event.  He is able to complete greater than 4 METS of physical activity. Patient was advised that if he develops new symptoms prior to surgery to contact our office to arrange a follow-up appointment.  He verbalized understanding. Per office protocol, if patient is without any new symptoms or concerns at the time of their virtual visit, he may hold ASA for 7 days prior to procedure. Please resume ASA as soon as possible postprocedure, at the discretion of the surgeon.  VS: BP 116/68   Pulse 80   Temp 36.6 C (Oral)   Ht 5' 9 (1.753 m)   Wt 85.7 kg   SpO2 94%   BMI 27.91 kg/m   PROVIDERS: Gib Charleston, MD is PCP   Fernande Standing, MD is Cardiologist  LABS: Labs reviewed: Acceptable for surgery. (all labs ordered are listed, but only abnormal results are displayed)  Labs Reviewed  CBC - Abnormal; Notable for the following  components:      Result Value   WBC 10.6 (*)    Hemoglobin 12.9 (*)    All other components within normal limits  BASIC METABOLIC PANEL WITH GFR - Abnormal; Notable for the following components:   Glucose, Bld 100 (*)    All other components within normal limits  GLUCOSE, CAPILLARY - Abnormal; Notable for the following components:   Glucose-Capillary 103 (*)    All other components within normal limits  TYPE AND SCREEN     IMAGES:   EKG:   CV: Echo 01/01/2022  1. Left ventricular ejection fraction, by estimation, is 55 to 60%. The  left ventricle has normal function. The left ventricle has no regional  wall motion abnormalities. Left ventricular diastolic parameters are  consistent with Grade I diastolic  dysfunction (impaired relaxation).   2. Right ventricular systolic function is normal. The right ventricular  size is normal.   3. The mitral valve is normal in structure. Mild mitral valve  regurgitation.   4. The aortic valve was not well visualized. Aortic valve regurgitation  is trivial. Aortic valve sclerosis/calcification is present, without any  evidence of aortic stenosis.   5. The inferior vena cava is normal in size with greater than 50%  respiratory variability, suggesting right atrial pressure of 3 mmHg.   Myocardial Perfusion 12/12/2017  Nuclear stress EF: 47%. There was no ST segment deviation noted during stress. Defect 1: There is a small defect of mild severity. This is a low risk study. The left ventricular ejection fraction  is mildly decreased (45-54%).   Mild apical perfusion abnormality and dyssynchrony, likely related to RV apical pacing. Low risk stress nuclear study with otherwise normal perfusion and mildy reduced normal left ventricular systolic function. Past Medical History:  Diagnosis Date   ADHD (attention deficit hyperactivity disorder)    Adjustment disorder with anxiety    Allergic rhinitis    Anxiety    Complete heart block (HCC)     a. s/p Medtronic PPM 06/17/15 (device dependent).   Depression    Diabetes mellitus (HCC)    Essential hypertension    Fracture of right clavicle 11/09/2009   Chronic right collarbone pain-non union   Headache    Heart murmur    at one time, not significant per pt   History of kidney stones    Hyperlipidemia    Hypokalemia    NSAID long-term use    Presence of permanent cardiac pacemaker    RECTAL BLEEDING 01/22/2008    Past Surgical History:  Procedure Laterality Date   COLONOSCOPY     EP IMPLANTABLE DEVICE N/A 06/17/2015   Procedure: Pacemaker Implant;  Surgeon: Elspeth JAYSON Sage, MD;  Location: [facility] INVASIVE CV LAB;  Service: Cardiovascular;  Laterality: N/A;   EP IMPLANTABLE DEVICE Right 11/06/2016   Procedure: INSERTION of new right ventricle pacemaker lead  and insertion of new pacemaker;  Surgeon: Danelle LELON Birmingham, MD;  Location: [facility] OR;  Service: Cardiovascular;  Laterality: Right;   ORIF CLAVICLE FRACTURE Right 01/2007   s/p collarbone fx with Fall-Murphy Wainer   PACEMAKER LEAD REMOVAL Left 11/06/2016   Procedure: PACEMAKER LEAD EXTRACTION;  Surgeon: Danelle LELON Birmingham, MD;  Location: [facility] OR;  Service: Cardiovascular;  Laterality: Left;  DR. ARMY TO BACKUP CASE   TEE WITHOUT CARDIOVERSION N/A 11/06/2016   Procedure: TRANSESOPHAGEAL ECHOCARDIOGRAM (TEE);  Surgeon: Danelle LELON Birmingham, MD;  Location: [facility] OR;  Service: Cardiovascular;  Laterality: N/A;   TONSILLECTOMY      MEDICATIONS:  amLODipine  (NORVASC ) 2.5 MG tablet   amphetamine -dextroamphetamine  (ADDERALL XR) 25 MG 24 hr capsule   aspirin -acetaminophen -caffeine  (EXCEDRIN  MIGRAINE) 250-250-65 MG per tablet   atorvastatin (LIPITOR) 20 MG tablet   buPROPion (WELLBUTRIN XL) 150 MG 24 hr tablet   furosemide  (LASIX ) 40 MG tablet   hydrochlorothiazide  (HYDRODIURIL ) 12.5 MG tablet   HYDROcodone -acetaminophen  (NORCO) 7.5-325 MG tablet   metFORMIN  (GLUCOPHAGE -XR) 500 MG 24 hr tablet   Multiple Vitamins-Minerals (ZINC PO)    olmesartan  (BENICAR ) 40 MG tablet   tadalafil (CIALIS) 5 MG tablet   tamsulosin  (FLOMAX ) 0.4 MG CAPS capsule   No current facility-administered medications for this encounter.      Harlene Hoots Ward, PA-C [facility] Pre-Surgical Testing [phone]

## 2024-10-08 NOTE — Anesthesia Preprocedure Evaluation (Signed)
 Anesthesia Evaluation  Patient identified by MRN, date of birth, ID band Patient awake    Reviewed: Allergy & Precautions, H&P , NPO status , Patient's Chart, lab work & pertinent test results  History of Anesthesia Complications Negative for: history of anesthetic complications  Airway Mallampati: IV  TM Distance: >3 FB Neck ROM: Full    Dental  (+) Teeth Intact, Dental Advisory Given   Pulmonary former smoker   Pulmonary exam normal breath sounds clear to auscultation       Cardiovascular hypertension (121/68 preop), Pt. on medications Normal cardiovascular exam+ dysrhythmias + pacemaker (CHB PPM)  Rhythm:Regular Rate:Normal  Complete heart block status post medtronic PPM   IMPRESSIONS     1. Left ventricular ejection fraction, by estimation, is 55 to 60%. The  left ventricle has normal function. The left ventricle has no regional  wall motion abnormalities. Left ventricular diastolic parameters are  consistent with Grade I diastolic  dysfunction (impaired relaxation).   2. Right ventricular systolic function is normal. The right ventricular  size is normal.   3. The mitral valve is normal in structure. Mild mitral valve  regurgitation.   4. The aortic valve was not well visualized. Aortic valve regurgitation  is trivial. Aortic valve sclerosis/calcification is present, without any  evidence of aortic stenosis.   5. The inferior vena cava is normal in size with greater than 50%  respiratory variability, suggesting right atrial pressure of 3 mmHg.     Neuro/Psych  Headaches, neg Seizures PSYCHIATRIC DISORDERS Anxiety Depression       GI/Hepatic negative GI ROS, Neg liver ROS,,,  Endo/Other  diabetes, Well Controlled, Type 2, Oral Hypoglycemic Agents    Renal/GU negative Renal ROS  negative genitourinary   Musculoskeletal negative musculoskeletal ROS (+)    Abdominal   Peds negative pediatric ROS (+)   Hematology negative hematology ROS (+)   Anesthesia Other Findings Enlarged prostate with urinary obstruction  Reproductive/Obstetrics negative OB ROS                              Anesthesia Physical Anesthesia Plan  ASA: 3  Anesthesia Plan: General   Post-op Pain Management: Ofirmev  IV (intra-op)*   Induction: Intravenous  PONV Risk Score and Plan: 2 and Ondansetron , Dexamethasone and Treatment may vary due to age or medical condition  Airway Management Planned: LMA  Additional Equipment: None  Intra-op Plan:   Post-operative Plan: Extubation in OR  Informed Consent: I have reviewed the patients History and Physical, chart, labs and discussed the procedure including the risks, benefits and alternatives for the proposed anesthesia with the patient or authorized representative who has indicated his/her understanding and acceptance.     Dental advisory given  Plan Discussed with: CRNA  Anesthesia Plan Comments:          Anesthesia Quick Evaluation

## 2024-10-09 ENCOUNTER — Encounter (HOSPITAL_COMMUNITY): Payer: Self-pay | Admitting: Urology

## 2024-10-09 ENCOUNTER — Encounter (HOSPITAL_COMMUNITY)
Admission: RE | Disposition: A | Discharge status: Home / Self Care | Payer: Self-pay | Source: Home / Self Care | Attending: Urology

## 2024-10-09 ENCOUNTER — Other Ambulatory Visit: Payer: Self-pay

## 2024-10-09 ENCOUNTER — Ambulatory Visit (HOSPITAL_COMMUNITY): Admitting: Certified Registered Nurse Anesthetist

## 2024-10-09 ENCOUNTER — Observation Stay (HOSPITAL_COMMUNITY)
Admission: RE | Admit: 2024-10-09 | Discharge: 2024-10-11 | Disposition: A | Discharge status: Home / Self Care | Attending: Urology | Admitting: Urology

## 2024-10-09 ENCOUNTER — Ambulatory Visit (HOSPITAL_COMMUNITY): Payer: Self-pay | Admitting: Physician Assistant

## 2024-10-09 DIAGNOSIS — Z7984 Long term (current) use of oral hypoglycemic drugs: Secondary | ICD-10-CM | POA: Diagnosis not present

## 2024-10-09 DIAGNOSIS — E119 Type 2 diabetes mellitus without complications: Secondary | ICD-10-CM

## 2024-10-09 DIAGNOSIS — I10 Essential (primary) hypertension: Secondary | ICD-10-CM | POA: Insufficient documentation

## 2024-10-09 DIAGNOSIS — N400 Enlarged prostate without lower urinary tract symptoms: Principal | ICD-10-CM | POA: Diagnosis present

## 2024-10-09 DIAGNOSIS — Z79899 Other long term (current) drug therapy: Secondary | ICD-10-CM | POA: Insufficient documentation

## 2024-10-09 DIAGNOSIS — R338 Other retention of urine: Secondary | ICD-10-CM

## 2024-10-09 DIAGNOSIS — R3915 Urgency of urination: Secondary | ICD-10-CM | POA: Diagnosis not present

## 2024-10-09 DIAGNOSIS — N401 Enlarged prostate with lower urinary tract symptoms: Principal | ICD-10-CM | POA: Insufficient documentation

## 2024-10-09 DIAGNOSIS — Z87891 Personal history of nicotine dependence: Secondary | ICD-10-CM | POA: Insufficient documentation

## 2024-10-09 HISTORY — PX: TRANSURETHRAL RESECTION OF PROSTATE: SHX73

## 2024-10-09 LAB — HEMOGLOBIN AND HEMATOCRIT, BLOOD
HCT: 36 % — ABNORMAL LOW (ref 39.0–52.0)
Hemoglobin: 11.4 g/dL — ABNORMAL LOW (ref 13.0–17.0)

## 2024-10-09 LAB — TYPE AND SCREEN
ABO/RH(D): O POS
Antibody Screen: NEGATIVE

## 2024-10-09 LAB — ABO/RH: ABO/RH(D): O POS

## 2024-10-09 LAB — GLUCOSE, CAPILLARY
Glucose-Capillary: 122 mg/dL — ABNORMAL HIGH (ref 70–99)
Glucose-Capillary: 125 mg/dL — ABNORMAL HIGH (ref 70–99)

## 2024-10-09 SURGERY — TURP (TRANSURETHRAL RESECTION OF PROSTATE)
Anesthesia: General | Site: Prostate

## 2024-10-09 MED ORDER — ONDANSETRON HCL 4 MG/2ML IJ SOLN
INTRAMUSCULAR | Status: DC | PRN
  Administered 2024-10-09: 4 mg via INTRAVENOUS

## 2024-10-09 MED ORDER — OXYCODONE-ACETAMINOPHEN 5-325 MG PO TABS
1.0000 | ORAL_TABLET | Freq: Four times a day (QID) | ORAL | 0 refills | Status: AC | PRN
Start: 2024-10-09 — End: 2025-10-09

## 2024-10-09 MED ORDER — ONDANSETRON HCL 4 MG/2ML IJ SOLN
INTRAMUSCULAR | Status: AC
  Filled 2024-10-09: qty 2

## 2024-10-09 MED ORDER — AMPHETAMINE-DEXTROAMPHET ER 25 MG PO CP24: 25.0000 mg | ORAL_CAPSULE | ORAL | Status: DC

## 2024-10-09 MED ORDER — FENTANYL CITRATE (PF) 100 MCG/2ML IJ SOLN
INTRAMUSCULAR | Status: DC | PRN
  Administered 2024-10-09 (×2): 50 ug via INTRAVENOUS

## 2024-10-09 MED ORDER — SULFAMETHOXAZOLE-TRIMETHOPRIM 800-160 MG PO TABS: 1.0000 | ORAL_TABLET | Freq: Two times a day (BID) | ORAL | 0 refills | Status: AC

## 2024-10-09 MED ORDER — LIDOCAINE HCL (PF) 2 % IJ SOLN
INTRAMUSCULAR | Status: AC
  Filled 2024-10-09: qty 5

## 2024-10-09 MED ORDER — ATORVASTATIN CALCIUM 20 MG PO TABS
20.0000 mg | ORAL_TABLET | Freq: Every day | ORAL | Status: DC
  Administered 2024-10-09 – 2024-10-11 (×3): 20 mg via ORAL
  Filled 2024-10-09 (×3): qty 1

## 2024-10-09 MED ORDER — SENNOSIDES-DOCUSATE SODIUM 8.6-50 MG PO TABS
1.0000 | ORAL_TABLET | Freq: Two times a day (BID) | ORAL | Status: DC
  Administered 2024-10-09 – 2024-10-11 (×4): 1 via ORAL
  Filled 2024-10-09 (×4): qty 1

## 2024-10-09 MED ORDER — HYDROCHLOROTHIAZIDE 12.5 MG PO TABS
12.5000 mg | ORAL_TABLET | Freq: Every morning | ORAL | Status: DC
  Administered 2024-10-10 – 2024-10-11 (×2): 12.5 mg via ORAL
  Filled 2024-10-09 (×2): qty 1

## 2024-10-09 MED ORDER — 0.9 % SODIUM CHLORIDE (POUR BTL) OPTIME
TOPICAL | Status: DC | PRN
  Administered 2024-10-09: 1000 mL

## 2024-10-09 MED ORDER — CHLORHEXIDINE GLUCONATE 0.12 % MT SOLN
15.0000 mL | Freq: Once | OROMUCOSAL | Status: AC
  Administered 2024-10-09: 15 mL via OROMUCOSAL

## 2024-10-09 MED ORDER — SODIUM CHLORIDE 0.9 % IV SOLN: INTRAVENOUS | Status: AC

## 2024-10-09 MED ORDER — FENTANYL CITRATE (PF) 100 MCG/2ML IJ SOLN
INTRAMUSCULAR | Status: AC
  Filled 2024-10-09: qty 2

## 2024-10-09 MED ORDER — FENTANYL CITRATE (PF) 50 MCG/ML IJ SOSY
PREFILLED_SYRINGE | INTRAMUSCULAR | Status: AC
  Filled 2024-10-09: qty 2

## 2024-10-09 MED ORDER — AMLODIPINE BESYLATE 5 MG PO TABS
2.5000 mg | ORAL_TABLET | Freq: Every day | ORAL | Status: DC
  Administered 2024-10-10 – 2024-10-11 (×2): 2.5 mg via ORAL
  Filled 2024-10-09 (×2): qty 1

## 2024-10-09 MED ORDER — GENTAMICIN SULFATE 40 MG/ML IJ SOLN
5.0000 mg/kg | INTRAVENOUS | Status: AC
  Administered 2024-10-09: 428.4 mg via INTRAVENOUS
  Filled 2024-10-09: qty 10.75

## 2024-10-09 MED ORDER — OXYCODONE HCL 5 MG PO TABS: 5.0000 mg | ORAL_TABLET | Freq: Once | ORAL | Status: DC | PRN

## 2024-10-09 MED ORDER — IRBESARTAN 300 MG PO TABS
300.0000 mg | ORAL_TABLET | Freq: Every day | ORAL | Status: DC
  Administered 2024-10-09 – 2024-10-11 (×3): 300 mg via ORAL
  Filled 2024-10-09 (×3): qty 1

## 2024-10-09 MED ORDER — OXYCODONE HCL 5 MG PO TABS
5.0000 mg | ORAL_TABLET | ORAL | Status: DC | PRN
  Administered 2024-10-09 – 2024-10-11 (×6): 5 mg via ORAL
  Filled 2024-10-09 (×7): qty 1

## 2024-10-09 MED ORDER — PHENYLEPHRINE HCL (PRESSORS) 10 MG/ML IV SOLN
INTRAVENOUS | Status: DC | PRN
  Administered 2024-10-09 (×5): 160 ug via INTRAVENOUS

## 2024-10-09 MED ORDER — ACETAMINOPHEN 500 MG PO TABS
1000.0000 mg | ORAL_TABLET | Freq: Four times a day (QID) | ORAL | Status: AC
  Administered 2024-10-10 (×4): 1000 mg via ORAL
  Filled 2024-10-09 (×4): qty 2

## 2024-10-09 MED ORDER — EPHEDRINE SULFATE (PRESSORS) 25 MG/5ML IV SOSY
PREFILLED_SYRINGE | INTRAVENOUS | Status: DC | PRN
  Administered 2024-10-09: 10 mg via INTRAVENOUS

## 2024-10-09 MED ORDER — FENTANYL CITRATE (PF) 50 MCG/ML IJ SOSY
25.0000 ug | PREFILLED_SYRINGE | INTRAMUSCULAR | Status: DC | PRN
  Administered 2024-10-09 (×2): 50 ug via INTRAVENOUS

## 2024-10-09 MED ORDER — PHENYLEPHRINE 80 MCG/ML (10ML) SYRINGE FOR IV PUSH (FOR BLOOD PRESSURE SUPPORT)
PREFILLED_SYRINGE | INTRAVENOUS | Status: AC
  Filled 2024-10-09: qty 10

## 2024-10-09 MED ORDER — METFORMIN HCL ER 500 MG PO TB24
1000.0000 mg | ORAL_TABLET | Freq: Every day | ORAL | Status: DC
  Administered 2024-10-10 – 2024-10-11 (×2): 1000 mg via ORAL
  Filled 2024-10-09 (×2): qty 2

## 2024-10-09 MED ORDER — ACETAMINOPHEN 10 MG/ML IV SOLN
INTRAVENOUS | Status: AC
  Filled 2024-10-09: qty 100

## 2024-10-09 MED ORDER — ACETAMINOPHEN 10 MG/ML IV SOLN
1000.0000 mg | Freq: Once | INTRAVENOUS | Status: DC | PRN
  Administered 2024-10-09: 1000 mg via INTRAVENOUS

## 2024-10-09 MED ORDER — EPHEDRINE 5 MG/ML INJ
INTRAVENOUS | Status: AC
  Filled 2024-10-09: qty 5

## 2024-10-09 MED ORDER — HYDROMORPHONE HCL 1 MG/ML IJ SOLN
0.5000 mg | INTRAMUSCULAR | Status: DC | PRN
  Administered 2024-10-09: 1 mg via INTRAVENOUS
  Filled 2024-10-09: qty 1

## 2024-10-09 MED ORDER — LIDOCAINE HCL (CARDIAC) PF 100 MG/5ML IV SOSY
PREFILLED_SYRINGE | INTRAVENOUS | Status: DC | PRN
  Administered 2024-10-09: 60 mg via INTRAVENOUS

## 2024-10-09 MED ORDER — DROPERIDOL 2.5 MG/ML IJ SOLN: 0.6250 mg | Freq: Once | INTRAMUSCULAR | Status: DC | PRN

## 2024-10-09 MED ORDER — OXYCODONE HCL 5 MG/5ML PO SOLN: 5.0000 mg | Freq: Once | ORAL | Status: DC | PRN

## 2024-10-09 MED ORDER — PROPOFOL 10 MG/ML IV BOLUS
INTRAVENOUS | Status: DC | PRN
  Administered 2024-10-09: 200 mg via INTRAVENOUS

## 2024-10-09 MED ORDER — BUPROPION HCL ER (XL) 150 MG PO TB24
150.0000 mg | ORAL_TABLET | Freq: Every morning | ORAL | Status: DC
  Administered 2024-10-10 – 2024-10-11 (×2): 150 mg via ORAL
  Filled 2024-10-09 (×2): qty 1

## 2024-10-09 MED ORDER — SENNOSIDES-DOCUSATE SODIUM 8.6-50 MG PO TABS: 1.0000 | ORAL_TABLET | Freq: Two times a day (BID) | ORAL | 0 refills | Status: AC

## 2024-10-09 MED ORDER — LACTATED RINGERS IV SOLN: INTRAVENOUS | Status: DC

## 2024-10-09 MED ORDER — SODIUM CHLORIDE 0.9 % IR SOLN
3000.0000 mL | Status: DC
  Administered 2024-10-09 – 2024-10-11 (×5): 3000 mL

## 2024-10-09 MED ORDER — ORAL CARE MOUTH RINSE: 15.0000 mL | Freq: Once | OROMUCOSAL | Status: AC

## 2024-10-09 MED ORDER — SODIUM CHLORIDE 0.9 % IR SOLN
Status: DC | PRN
  Administered 2024-10-09 (×3): 6000 mL

## 2024-10-09 MED ORDER — DEXAMETHASONE SODIUM PHOSPHATE 4 MG/ML IJ SOLN
INTRAMUSCULAR | Status: DC | PRN
  Administered 2024-10-09: 5 mg via INTRAVENOUS

## 2024-10-09 SURGICAL SUPPLY — 19 items
BAG URINE DRAIN 2000ML AR STRL (UROLOGICAL SUPPLIES) ×2 IMPLANT
BAG URO CATCHER STRL LF (MISCELLANEOUS) ×2 IMPLANT
CATH URTH STD 24FR FL 3W 2 (CATHETERS) IMPLANT
DRAPE FOOT SWITCH (DRAPES) ×2 IMPLANT
GLOVE SURG LX STRL 7.5 STRW (GLOVE) ×2 IMPLANT
GOWN STRL REUS W/ TWL XL LVL3 (GOWN DISPOSABLE) ×2 IMPLANT
GUIDEWIRE STR DUAL SENSOR (WIRE) IMPLANT
HOLDER FOLEY CATH W/STRAP (MISCELLANEOUS) IMPLANT
IV CATH AUTO 14GX1.75 SAFE ORG (IV SOLUTION) IMPLANT
KIT TURNOVER KIT A (KITS) ×2 IMPLANT
LOOP CUT BIPOLAR 24F LRG (ELECTROSURGICAL) IMPLANT
MANIFOLD NEPTUNE II (INSTRUMENTS) ×2 IMPLANT
PACK CYSTO (CUSTOM PROCEDURE TRAY) ×2 IMPLANT
PAD PREP 24X48 CUFFED NSTRL (MISCELLANEOUS) ×2 IMPLANT
SYR 30ML LL (SYRINGE) ×2 IMPLANT
SYRINGE TOOMEY IRRIG 70ML (MISCELLANEOUS) ×2 IMPLANT
TUBING CONNECTING 10 (TUBING) ×2 IMPLANT
TUBING UROLOGY SET (TUBING) ×2 IMPLANT
WATER STERILE IRR 500ML POUR (IV SOLUTION) IMPLANT

## 2024-10-09 NOTE — Brief Op Note (Signed)
 10/09/2024  4:44 PM  PATIENT:  Jeremy Abbott  78 y.o. male  PRE-OPERATIVE DIAGNOSIS:  prostate, retention  POST-OPERATIVE DIAGNOSIS:  prostate, retention  PROCEDURE:  Procedure(s): TURP (TRANSURETHRAL RESECTION OF PROSTATE) (N/A)  SURGEON:  Surgeons and Role:    * Manny, Ricardo KATHEE Raddle., MD - Primary  PHYSICIAN ASSISTANT:   ASSISTANTS: none   ANESTHESIA:   general  EBL:  25 mL   BLOOD ADMINISTERED:none  DRAINS: 3 way foley to NS irrigation   LOCAL MEDICATIONS USED:  NONE  SPECIMEN:  Source of Specimen:  prostate chips  DISPOSITION OF SPECIMEN:  PATHOLOGY  COUNTS:  YES  TOURNIQUET:  * No tourniquets in log *  DICTATION: .Other Dictation: Dictation Number [id number]  PLAN OF CARE: Admit for overnight observation  PATIENT DISPOSITION:  PACU - hemodynamically stable.   Delay start of Pharmacological VTE agent (>24hrs) due to surgical blood loss or risk of bleeding: yes

## 2024-10-09 NOTE — Discharge Instructions (Signed)
1 - You may have urinary urgency (bladder spasms) and bloody urine on / off with catheter in place. This is normal. ° °2 - Call MD or go to ER for fever >102, severe pain / nausea / vomiting not relieved by medications, or acute change in medical status ° °

## 2024-10-09 NOTE — Transfer of Care (Signed)
 Immediate Anesthesia Transfer of Care Note  Patient: Jeremy Abbott  Procedure(s) Performed: TURP (TRANSURETHRAL RESECTION OF PROSTATE) (Prostate)  Patient Location: PACU  Anesthesia Type:General  Level of Consciousness: awake, alert , and oriented  Airway & Oxygen Therapy: Patient Spontanous Breathing and Patient connected to nasal cannula oxygen  Post-op Assessment: Report given to RN and Post -op Vital signs reviewed and stable  Post vital signs: Reviewed and stable  Last Vitals:  Vitals Value Taken Time  BP    Temp    Pulse    Resp 16 10/09/24 16:50  SpO2    Vitals shown include unfiled device data.  Last Pain:  Vitals:   10/09/24 1355  TempSrc: Oral  PainSc:          Complications: No notable events documented.

## 2024-10-09 NOTE — H&P (Signed)
 Jeremy Abbott is an 78 y.o. male.    Chief Complaint: Pre-OP Transurethral Resection of Prostate  HPI:   1 - Elevated PSA - No FHX prostate cancer. Negative BX 2017. MRI 2024 73gm.    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 Some progressive bother from mostly irritative symptoms 2021. PVR 21 mL (normal). Added solifenacin 2021, then finasteride 2025. Developed frank retention 08/2024 despite max meds.    PMH sig for arrythmia / pacer (follows Dr. Beverli cards), DM2, anxiety . His PCP is Alm Rav MD with [facility].   Today [name] is seen to proceed with TURP for med-refractory retention. On bactrim pre-op to reduce colonization as has had cathteter abtou 6 weeks. NO interval fevers. Hgb 12.9, Cr 0.89 most recently. Cards clearance on file.   Past Medical History:  Diagnosis Date   ADHD (attention deficit hyperactivity disorder)    Adjustment disorder with anxiety    Allergic rhinitis    Anxiety    Complete heart block (HCC)    a. s/p Medtronic PPM 06/17/15 (device dependent).   Depression    Diabetes mellitus (HCC)    Essential hypertension    Fracture of right clavicle 11/09/2009   Chronic right collarbone pain-non union   Headache    Heart murmur    at one time, not significant per pt   History of kidney stones    Hyperlipidemia    Hypokalemia    NSAID long-term use    Presence of permanent cardiac pacemaker    RECTAL BLEEDING 01/22/2008    Past Surgical History:  Procedure Laterality Date   COLONOSCOPY     EP IMPLANTABLE DEVICE N/A 06/17/2015   Procedure: Pacemaker Implant;  Surgeon: Elspeth JAYSON Sage, MD;  Location: [facility] INVASIVE CV LAB;  Service: Cardiovascular;  Laterality: N/A;   EP IMPLANTABLE DEVICE Right 11/06/2016   Procedure: INSERTION of new right ventricle pacemaker lead  and insertion of new pacemaker;  Surgeon: Danelle LELON Birmingham, MD;  Location: [facility] OR;  Service: Cardiovascular;  Laterality:  Right;   ORIF CLAVICLE FRACTURE Right 01/2007   s/p collarbone fx with Fall-Murphy Wainer   PACEMAKER LEAD REMOVAL Left 11/06/2016   Procedure: PACEMAKER LEAD EXTRACTION;  Surgeon: Danelle LELON Birmingham, MD;  Location: [facility] OR;  Service: Cardiovascular;  Laterality: Left;  DR. ARMY TO BACKUP CASE   TEE WITHOUT CARDIOVERSION N/A 11/06/2016   Procedure: TRANSESOPHAGEAL ECHOCARDIOGRAM (TEE);  Surgeon: Danelle LELON Birmingham, MD;  Location: [facility] OR;  Service: Cardiovascular;  Laterality: N/A;   TONSILLECTOMY      Family History  Problem Relation Age of Onset   Heart failure Father    Diabetes Daughter    Heart attack Neg Hx    Stroke Neg Hx    Social History:  reports that he has quit smoking. He has never used smokeless tobacco. He reports that he does not drink alcohol and does not use drugs.  Allergies:  Allergies  Allergen Reactions   Amitriptyline  Hcl Other (See Comments)    Sleeping for long periods of time   No Known Allergies     No medications prior to admission.    Results for orders placed or performed during the hospital encounter of 10/07/24 (from the past 48 hours)  Glucose, capillary     Status: Abnormal   Collection Time: 10/07/24  1:40 PM  Result Value Ref Range   Glucose-Capillary 103 (H) 70 - 99 mg/dL  Comment: Glucose reference range applies only to samples taken after fasting for at least 8 hours.  CBC     Status: Abnormal   Collection Time: 10/07/24  2:01 PM  Result Value Ref Range   WBC 10.6 (H) 4.0 - 10.5 K/uL   RBC 4.52 4.22 - 5.81 MIL/uL   Hemoglobin 12.9 (L) 13.0 - 17.0 g/dL   HCT 58.1 60.9 - 47.9 %   MCV 92.5 80.0 - 100.0 fL   MCH 28.5 26.0 - 34.0 pg   MCHC 30.9 30.0 - 36.0 g/dL   RDW 85.4 88.4 - 84.4 %   Platelets 227 150 - 400 K/uL   nRBC 0.0 0.0 - 0.2 %    Comment: Performed at [facility], [address] [place], [place] [address]  Basic metabolic panel     Status: Abnormal   Collection Time: 10/07/24  2:01 PM  Result Value Ref  Range   Sodium 141 135 - 145 mmol/L   Potassium 4.2 3.5 - 5.1 mmol/L   Chloride 104 98 - 111 mmol/L   CO2 27 22 - 32 mmol/L   Glucose, Bld 100 (H) 70 - 99 mg/dL    Comment: Glucose reference range applies only to samples taken after fasting for at least 8 hours.   BUN 19 8 - 23 mg/dL   Creatinine, Ser 9.10 0.61 - 1.24 mg/dL   Calcium 9.5 8.9 - 10.3 mg/dL   GFR, Estimated >39 >39 mL/min    Comment: (NOTE) Calculated using the CKD-EPI Creatinine Equation (2021)    Anion gap 10 5 - 15    Comment: Performed at [facility], [address], [place], [place] [address]  Type and screen     Status: None   Collection Time: 10/07/24  2:01 PM  Result Value Ref Range   ABO/RH(D) O POS    Antibody Screen NEG    Sample Expiration 10/21/2024,2359    Extend sample reason      NO TRANSFUSIONS OR PREGNANCY IN THE PAST 3 MONTHS Performed at [facility], [address] [place], [place] [address]    No results found.  Review of Systems  Constitutional:  Negative for chills and fever.  Genitourinary:  Positive for difficulty urinating.  All other systems reviewed and are negative.   There were no vitals taken for this visit. Physical Exam Vitals reviewed.  HENT:     Head: Normocephalic.  Eyes:     Pupils: Pupils are equal, round, and reactive to light.  Cardiovascular:     Rate and Rhythm: Normal rate.  Pulmonary:     Effort: Pulmonary effort is normal.  Abdominal:     General: Abdomen is flat.  Genitourinary:    Comments: Catheter in place with non-foul urine.  Musculoskeletal:        General: Normal range of motion.     Cervical back: Normal range of motion.  Neurological:     General: No focal deficit present.     Mental Status: He is alert.  Psychiatric:        Mood and Affect: Mood normal.      Assessment/Plan  PRoceed as planned with TURP for med-refracotry retention. Risks, benefits, alternatives, expected peri-op course  discussed in detail.   [name]., MD 10/09/2024, 7:00 AM

## 2024-10-09 NOTE — Progress Notes (Signed)
 Pt does not meet criteria for Glycemic protocol.

## 2024-10-09 NOTE — Anesthesia Procedure Notes (Addendum)
 Procedure Name: LMA Insertion Date/Time: 10/09/2024 3:50 PM  Performed by: Buster Catheryn SAUNDERS, CRNAPre-anesthesia Checklist: Patient identified, Emergency Drugs available, Suction available and Patient being monitored Patient Re-evaluated:Patient Re-evaluated prior to induction Oxygen Delivery Method: Circle system utilized Preoxygenation: Pre-oxygenation with 100% oxygen Induction Type: IV induction Ventilation: Mask ventilation without difficulty LMA: LMA inserted LMA Size: 5.0 Number of attempts: 1 Placement Confirmation: positive ETCO2 Tube secured with: Tape Dental Injury: Teeth and Oropharynx as per pre-operative assessment

## 2024-10-09 NOTE — Anesthesia Postprocedure Evaluation (Signed)
 Anesthesia Post Note  Patient: Jeremy Abbott  Procedure(s) Performed: TURP (TRANSURETHRAL RESECTION OF PROSTATE) (Prostate)     Patient location during evaluation: PACU Anesthesia Type: General Level of consciousness: awake and alert Pain management: pain level controlled Vital Signs Assessment: post-procedure vital signs reviewed and stable Respiratory status: spontaneous breathing, nonlabored ventilation, respiratory function stable and patient connected to nasal cannula oxygen Cardiovascular status: blood pressure returned to baseline and stable Postop Assessment: no apparent nausea or vomiting Anesthetic complications: no   No notable events documented.  Last Vitals:  Vitals:   10/09/24 1800 10/09/24 1814  BP: (!) 105/59 108/65  Pulse: 72 76  Resp: 13 18  Temp:  (!) 36.4 C  SpO2: 95% 98%    Last Pain:  Vitals:   10/09/24 1828  TempSrc:   PainSc: 9                  Thom JONELLE Peoples

## 2024-10-10 ENCOUNTER — Encounter (HOSPITAL_COMMUNITY): Payer: Self-pay | Admitting: Urology

## 2024-10-10 DIAGNOSIS — N401 Enlarged prostate with lower urinary tract symptoms: Secondary | ICD-10-CM | POA: Diagnosis not present

## 2024-10-10 LAB — HEMOGLOBIN AND HEMATOCRIT, BLOOD
HCT: 38.3 % — ABNORMAL LOW (ref 39.0–52.0)
Hemoglobin: 12.2 g/dL — ABNORMAL LOW (ref 13.0–17.0)

## 2024-10-10 NOTE — Progress Notes (Signed)
 AVS reviewed with patient and wife who verbalized an understanding. Patient removed from tele. Concerns about dark cherry cola color of urine in foley bag. Secure chat sent to Dr Carolee by Lonell, RN. This RN emphasized the importance of increasing his PO intake to a minimum of 64 oz a day. Patients states he has had a glass of water and one gingerale today- patient drinking a second glass of water at this time. Per primary nurse, Olena, discharge on hold.

## 2024-10-10 NOTE — Progress Notes (Signed)
 Mobility Specialist - Progress Note   10/10/24 1149  Mobility  Activity Ambulated independently  Level of Assistance Independent after set-up  Assistive Device None  Distance Ambulated (ft) 400 ft  Range of Motion/Exercises Active  Activity Response Tolerated well  Mobility visit 1 Mobility  Mobility Specialist Start Time (ACUTE ONLY) 1139  Mobility Specialist Stop Time (ACUTE ONLY) 1149  Mobility Specialist Time Calculation (min) (ACUTE ONLY) 10 min   Pt was found in bed and agreeable to mobilize. No complaints. At EOS returned to room with all needs met. NT in room.   Erminio Leos,  Mobility Specialist Can be reached via Secure Chat

## 2024-10-10 NOTE — Care Management Obs Status (Signed)
 MEDICARE OBSERVATION STATUS NOTIFICATION   Patient Details  Name: LOYCE KLASEN MRN: [id number] Date of Birth: Nov 14, 1946   Medicare Observation Status Notification Given:  Yes    Sonda Manuella Quill, RN 10/10/2024, 5:09 PM

## 2024-10-10 NOTE — Op Note (Unsigned)
 NAME: Jeremy Abbott, RIDEAUX MEDICAL RECORD NO: [id number] ACCOUNT NO: [id number] DATE OF BIRTH: 1946/01/17 FACILITY: [facility] LOCATION: [id number] PHYSICIAN: Ricardo Likens, MD  Operative Report   SURGEON:  Ricardo Likens, MD.  PREOPERATIVE DIAGNOSIS:  Refractory urinary retention.  PROCEDURE PERFORMED:  Transurethral resection of the prostate.  ESTIMATED BLOOD LOSS: 50 mL.  COMPLICATIONS: None.  SPECIMENS:  Prostate chips for permanent pathology.  FINDINGS: 1.  Bilobar prostatic hypertrophy with kissing lobes pre-resection. 2.  Wide open urinary channel from the verumontanum to the bladder neck post-resection.  DRAINS:  A 24-French 3-way Foley catheter to normal saline irrigation, 30 mL water in the balloon.  INDICATIONS:  The patient is a pleasant 78 year old man with a longstanding history of obstructive and irritative voiding. He is on maximum medical therapy with alpha-blockers and 5-alpha reductase inhibitors for some time.  He unfortunately developed  frank retention earlier this year despite documented preserved bladder function recently overall due to refractory urinary retention.  Options discussed for management including chronic catheter versus outlet procedure.  Given his prostate volume of  approximately 75 g, I counseled him towards transurethral resection given the durability of the result in his gland size.  He presents for this today.  Informed consent was obtained and placed in the medical record.  DESCRIPTION OF PROCEDURE: The patient being Jeremy Abbott. Tulloch verified and the procedure being transurethral resection of the prostate was confirmed.  Procedure timeout was performed.  Intravenous antibiotics were administered.  General LMA anesthesia was  induced and the patient was placed into a low lithotomy position.  A sterile field was created, prepping and draping the patient's penis, perineum, proximal thighs using iodine.  Cystourethroscopy was performed using a 21-French rigid  cystoscope with  offset lens.  Inspection of the anterior and posterior urethra only revealed significant bilobar prostatic hypertrophy with kissing lobe anatomy.  The bladder was unremarkable other than some mild edema from the prior catheter. Ureteral orifices were  single.  No papillary lesions or calcifications were noted.  The cystoscope was exchanged for a 26-French resectoscope sheath with visual obturator and using a bipolar resectoscope loop, transurethral resection was performed in a top-down fashion, first  at the 12 o'clock position from the bladder neck towards the area of the verumontanum, down to the superficial fibromuscular stroma of the prostate capsule, then of the right lobe from the 12 o'clock to 6 o'clock position, and then at the left lobe  similarly from the 12 o'clock to the 6 o'clock position.  This generated innumerable prostate fragments, which were then irrigated out and set aside for permanent pathology.  After verifying all fragments had been irrigated, the entire prostatic fossa  was fulgurated in a descending spiral fashion which yielded excellent hemostasis. A sensor wire was placed across this to the level of the urinary bladder, over which a 24-French 3-way Foley catheter was carefully placed to minimize bladder neck trauma. 30 mL of sterile  water was placed in the balloon and connected to normal saline irrigation.  The procedure was terminated. The patient tolerated the procedure well. No immediate periprocedural complications.  The patient was taken from the operating room to postanesthesia care unit  in stable condition.  Plan for observation and admission.  Likely discharge home tomorrow.   NIK D: 10/09/2024 4:48:38 pm T: 10/10/2024 12:30:00 am  JOB: [id number]/ [id number]

## 2024-10-10 NOTE — Progress Notes (Signed)
 Pt clo of bladder fullness once, foley cath irrigated, no clot noted, tolerated well. No further complaints.

## 2024-10-10 NOTE — Discharge Summary (Deleted)
 Physician Discharge Summary  Patient ID: Jeremy Abbott MRN: [id number] DOB/AGE: July 06, 1946 78 y.o.  Admit date: 10/09/2024 Discharge date: 10/10/2024  Admission Diagnoses:  Discharge Diagnoses:  Principal Problem:   Prostate hypertrophy   Discharged Condition: good  Hospital Course: Patient underwent TURP.  Overnight had a likely bladder spasm.  Foley catheter was irrigated but no clot was noted.  Urine light pink off CBI this morning.  Consults: None  Significant Diagnostic Studies: None  Treatments: surgery: TURP  Discharge Exam: Blood pressure 116/60, pulse 87, temperature 97.8 F (36.6 C), temperature source Oral, resp. rate 17, height 5' 9 (1.753 m), weight 85.7 kg, SpO2 96%. General appearance: alert no acute distress Adequate perfusion of extremities Nonlabored respiration Three-way Foley catheter in place light pink off CBI  Disposition: Discharge disposition: 01-Home or Self Care        Allergies as of 10/10/2024       Reactions   Amitriptyline  Hcl Other (See Comments)   Sleeping for long periods of time   No Known Allergies         Medication List     STOP taking these medications    HYDROcodone -acetaminophen  7.5-325 MG tablet Commonly known as: NORCO   tamsulosin  0.4 MG Caps capsule Commonly known as: FLOMAX        TAKE these medications    amLODipine  2.5 MG tablet Commonly known as: NORVASC  Take 1 tablet (2.5 mg total) by mouth daily.   amphetamine -dextroamphetamine  25 MG 24 hr capsule Commonly known as: ADDERALL XR Take 25 mg by mouth every morning.   aspirin -acetaminophen -caffeine  250-250-65 MG tablet Commonly known as: EXCEDRIN  MIGRAINE Take 2 tablets by mouth every 6 (six) hours as needed for headache.   atorvastatin 20 MG tablet Commonly known as: LIPITOR Take 20 mg by mouth daily.   buPROPion 150 MG 24 hr tablet Commonly known as: WELLBUTRIN XL Take 150 mg by mouth every morning.   furosemide  40 MG tablet Commonly  known as: LASIX  Take one tablet by mouth every other day x 4 doses then stop.   hydrochlorothiazide  12.5 MG tablet Commonly known as: HYDRODIURIL  Take 12.5 mg by mouth every morning.   metFORMIN  500 MG 24 hr tablet Commonly known as: GLUCOPHAGE -XR Take 1,000 mg by mouth every morning.   olmesartan  40 MG tablet Commonly known as: BENICAR  Take 40 mg by mouth daily.   oxyCODONE -acetaminophen  5-325 MG tablet Commonly known as: Percocet Take 1 tablet by mouth every 6 (six) hours as needed for moderate pain (pain score 4-6) or severe pain (pain score 7-10) (post-operatively).   senna-docusate 8.6-50 MG tablet Commonly known as: Senokot-S Take 1 tablet by mouth 2 (two) times daily. While taking strong pain meds to prevent constipation.   sulfamethoxazole-trimethoprim 800-160 MG tablet Commonly known as: BACTRIM DS Take 1 tablet by mouth 2 (two) times daily. X 3 days to prevent post-op infection.   tadalafil 5 MG tablet Commonly known as: CIALIS Take 5 mg by mouth daily.   ZINC PO Take 1 tablet by mouth daily.        Follow-up Information     Jeremy Abbott., MD Follow up on 10/12/2024.   Specialty: Urology Why: at 1:30 for MD visit, pathology review, and catheter removal. Contact information: [address] [place] [address] [phone]                 Signed: Sherwood Jeremy Abbott, Jeremy Abbott 10/10/2024, 11:45 AM

## 2024-10-10 NOTE — Progress Notes (Signed)
 Urology Inpatient Progress Report  Enlarged prostate with urinary obstruction [N40.1, N13.8] Prostate hypertrophy [N40.0]  Procedure(s): TURP (TRANSURETHRAL RESECTION OF PROSTATE)  1 Day Post-Op   Intv/Subj: No acute events overnight. Patient is without complaint.  Urine got a little bit bloody after ambulation so restarting CBI and keeping for another night.  Principal Problem:   Prostate hypertrophy  Current Facility-Administered Medications  Medication Dose Route Frequency Provider Last Rate Last Admin   0.9 %  sodium chloride  infusion   Intravenous Continuous Alvaro Theodore B Jr., MD 50 mL/hr at 10/09/24 1837 New Bag at 10/09/24 1837   acetaminophen  (TYLENOL ) tablet 1,000 mg  1,000 mg Oral Q6H Manny, Theodore B Jr., MD   1,000 mg at 10/10/24 1326   amLODipine  (NORVASC ) tablet 2.5 mg  2.5 mg Oral Daily Manny, Theodore B Jr., MD   2.5 mg at 10/10/24 9140   amphetamine -dextroamphetamine  (ADDERALL XR) 24 hr capsule 1 capsule  25 mg Oral [id number] Manny, Ricardo KATHEE Raddle., MD       atorvastatin (LIPITOR) tablet 20 mg  20 mg Oral Daily Manny, Theodore B Jr., MD   20 mg at 10/10/24 9147   buPROPion (WELLBUTRIN XL) 24 hr tablet 150 mg  150 mg Oral q morning Manny, Theodore B Jr., MD   150 mg at 10/10/24 9147   hydrochlorothiazide  (HYDRODIURIL ) tablet 12.5 mg  12.5 mg Oral q morning Manny, Theodore B Jr., MD   12.5 mg at 10/10/24 9148   HYDROmorphone (DILAUDID) injection 0.5-1 mg  0.5-1 mg Intravenous Q2H PRN Manny, Theodore B Jr., MD   1 mg at 10/09/24 1828   irbesartan (AVAPRO) tablet 300 mg  300 mg Oral Daily Manny, Theodore B Jr., MD   300 mg at 10/10/24 9147   metFORMIN  (GLUCOPHAGE -XR) 24 hr tablet 1,000 mg  1,000 mg Oral Q breakfast Manny, Theodore B Jr., MD   1,000 mg at 10/10/24 9148   oxyCODONE  (Oxy IR/ROXICODONE ) immediate release tablet 5 mg  5 mg Oral Q4H PRN Manny, Theodore B Jr., MD   5 mg at 10/10/24 1329   senna-docusate (Senokot-S) tablet 1 tablet  1 tablet Oral BID Alvaro Ricardo KATHEE Raddle., MD   1 tablet at 10/10/24 9148   sodium chloride  irrigation 0.9 % 3,000 mL  3,000 mL Irrigation Continuous Manny, Theodore B Jr., MD   3,000 mL at 10/10/24 0451     Objective: Vital: Vitals:   10/09/24 1814 10/09/24 2129 10/10/24 0219 10/10/24 0505  BP: 108/65 (!) 99/52 95/62 116/60  Pulse: 76 79 77 87  Resp: 18 18 16 17   Temp: (!) 97.5 F (36.4 C) (!) 97.5 F (36.4 C) 98.4 F (36.9 C) 97.8 F (36.6 C)  TempSrc: Oral Oral Oral Oral  SpO2: 98% 93% 97% 96%  Weight:      Height:       I/Os: I/O last 3 completed shifts: In: 2928.9 [I.V.:1518.5; Other:1300; IV Piggyback:110.4] Out: 87749 [Urine:12225; Blood:25]  Physical Exam:  General: Patient is in no apparent distress Lungs: Normal respiratory effort, chest expands symmetrically. GI: The abdomen is soft and nontender without mass. Foley: Draining light pink off CBI this morning Ext: lower extremities symmetric  Lab Results: Recent Labs    10/07/24 1401 10/09/24 1706 10/10/24 0449  WBC 10.6*  --   --   HGB 12.9* 11.4* 12.2*  HCT 41.8 36.0* 38.3*   Recent Labs    10/07/24 1401  NA 141  K 4.2  CL 104  CO2 27  GLUCOSE 100*  BUN 19  CREATININE 0.89  CALCIUM 9.5   No results for input(s): LABPT, INR in the last 72 hours. No results for input(s): LABURIN in the last 72 hours. Results for orders placed or performed during the hospital encounter of 08/15/24  Urine Culture     Status: None   Collection Time: 08/15/24  7:39 PM   Specimen: Urine, Catheterized  Result Value Ref Range Status   Specimen Description   Final    URINE, CATHETERIZED Performed at [facility], [address] [place], [place] [address]    Special Requests   Final    NONE Performed at [facility], [address] [place], [place] [address]    Culture   Final    NO GROWTH Performed at [facility], [address] [place], [place] [address]    Report Status  08/17/2024 FINAL  Final    Studies/Results: No results found.  Assessment: BPH with lower urinary tract symptoms  Procedure(s): TURP (TRANSURETHRAL RESECTION OF PROSTATE), 1 Day Post-Op  doing well.  Plan: Was initially ready for discharge but after ambulation urine got a little bit bloody so going to restart CBI on the light drip and likely discharge tomorrow.   Sherwood Edison, MD Urology 10/10/2024, 1:59 PM

## 2024-10-10 NOTE — TOC Initial Note (Signed)
 Transition of Care [facility]) - Initial/Assessment Note    Patient Details  Name: Jeremy Abbott MRN: [id number] Date of Birth: Dec 19, 1945  Transition of Care [facility]) CM/SW Contact:    Sonda Manuella Quill, RN Phone Number: 10/10/2024, 5:24 PM  Clinical Narrative:                 Beatris w/ pt in room; pt said he lives at home; he plans to return at d/c w/ support from his family/friends; he identified POC Slater Easterly [phone]); she will provide transportation; pt verified insurance/PCP; he denied SDOH risks; pt said he does not have DME, HH services, or home oxygen; IP CM is following.  Expected Discharge Plan: Home/Self Care Barriers to Discharge: Continued Medical Work up   Patient Goals and CMS Choice Patient states their goals for this hospitalization and ongoing recovery are:: home          Expected Discharge Plan and Services   Discharge Planning Services: CM Consult   Living arrangements for the past 2 months: Single Family Home Expected Discharge Date: 10/10/24               DME Arranged: N/A DME Agency: NA       HH Arranged: NA HH Agency: NA        Prior Living Arrangements/Services Living arrangements for the past 2 months: Single Family Home Lives with:: Self Patient language and need for interpreter reviewed:: Yes Do you feel safe going back to the place where you live?: Yes      Need for Family Participation in Patient Care: Yes (Comment) Care giver support system in place?: Yes (comment) Current home services:  (n/a) Criminal Activity/Legal Involvement Pertinent to Current Situation/Hospitalization: No - Comment as needed  Activities of Daily Living      Permission Sought/Granted Permission sought to share information with : Case Manager Permission granted to share information with : Yes, Verbal Permission Granted  Share Information with NAME: Case Manager     Permission granted to share info w Relationship: Slater Easterly (friend)  [phone]     Emotional Assessment Appearance:: Appears stated age Attitude/Demeanor/Rapport: Gracious Affect (typically observed): Accepting Orientation: : Oriented to Self, Oriented to Place, Oriented to  Time, Oriented to Situation Alcohol / Substance Use: Not Applicable Psych Involvement: No (comment)  Admission diagnosis:  Enlarged prostate with urinary obstruction [N40.1, N13.8] Prostate hypertrophy [N40.0] Patient Active Problem List   Diagnosis Date Noted   Prostate hypertrophy 10/09/2024   Posterior vitreous detachment of both eyes 06/27/2021   Nuclear sclerotic cataract of both eyes 06/27/2021   NSVT (nonsustained ventricular tachycardia) (HCC) 12/14/2020   Atrial tachycardia 12/06/2019   Pacemaker 12/06/2019   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   Stokes-Adams syncope    Complete heart block (HCC)    Pacemaker failure    Symptomatic bradycardia 06/17/2015   Mobitz type 2 second degree AV block 06/17/2015   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   Mobitz type 2 second degree atrioventricular block 06/17/2015   Paresthesia 05/12/2012   Left shoulder pain 05/14/2011   Preventative health care 05/13/2011   TACHYCARDIA 10/19/2010   BACK PAIN 11/09/2009   PSA, INCREASED 11/09/2009   FRACTURE, CLAVICLE, RIGHT 11/09/2009   Adjustment disorder with anxiety 08/04/2008   ADD 08/04/2008   SHOULDER STRAIN, RIGHT 08/04/2008   DM2 (diabetes mellitus, type 2) (HCC) 02/19/2008   Hyperlipidemia 02/19/2008   ANXIETY 02/19/2008   DEPRESSION 02/19/2008   Essential hypertension 02/19/2008   ALLERGIC RHINITIS 02/19/2008  PERIPHERAL EDEMA 02/19/2008   RECTAL BLEEDING 01/22/2008   PCP:  Gib Charleston, MD Pharmacy:   [facility] [place], [place] - [address] [facility] [address] [place] [address] Phone: [phone] Fax: [phone]     Social Drivers of Health (SDOH) Social History: SDOH Screenings   Food  Insecurity: No Food Insecurity (10/10/2024)  Housing: Low Risk  (10/10/2024)  Transportation Needs: No Transportation Needs (10/10/2024)  Utilities: Not At Risk (10/10/2024)  Social Connections: Socially Integrated (10/09/2024)  Tobacco Use: Medium Risk (10/09/2024)   SDOH Interventions: Food Insecurity Interventions: Intervention Not Indicated, Inpatient TOC Housing Interventions: Intervention Not Indicated, Inpatient TOC Transportation Interventions: Intervention Not Indicated, Inpatient TOC Utilities Interventions: Intervention Not Indicated, Inpatient TOC   Readmission Risk Interventions     No data to display

## 2024-10-11 DIAGNOSIS — N401 Enlarged prostate with lower urinary tract symptoms: Secondary | ICD-10-CM | POA: Diagnosis not present

## 2024-10-11 NOTE — TOC Transition Note (Signed)
 Transition of Care [facility]) - Discharge Note   Patient Details  Name: YALE GOLLA MRN: [id number] Date of Birth: 01/03/1946  [facility] CM/SW Contact:  Sonda Manuella Quill, RN Phone Number: 10/11/2024, 12:15 PM   Clinical Narrative:    D/C orders received; no IP CM needs.   Final next level of care: Home/Self Care Barriers to Discharge: No Barriers Identified   Patient Goals and CMS Choice Patient states their goals for this hospitalization and ongoing recovery are:: home          Discharge Placement                       Discharge Plan and Services Additional resources added to the After Visit Summary for     Discharge Planning Services: CM Consult            DME Arranged: N/A DME Agency: NA       HH Arranged: NA HH Agency: NA        Social Drivers of Health (SDOH) Interventions SDOH Screenings   Food Insecurity: No Food Insecurity (10/10/2024)  Housing: Low Risk  (10/10/2024)  Transportation Needs: No Transportation Needs (10/10/2024)  Utilities: Not At Risk (10/10/2024)  Social Connections: Socially Integrated (10/09/2024)  Tobacco Use: Medium Risk (10/09/2024)     Readmission Risk Interventions     No data to display

## 2024-10-11 NOTE — Progress Notes (Signed)
 Mobility Specialist - Progress Note   10/11/24 1400  Mobility  Activity Ambulated independently  Level of Assistance Independent after set-up  Assistive Device None  Distance Ambulated (ft) 3500 ft  Range of Motion/Exercises Active  Activity Response Tolerated well  Mobility Referral Yes  Mobility visit 1 Mobility  Mobility Specialist Start Time (ACUTE ONLY) 1320  Mobility Specialist Stop Time (ACUTE ONLY) 1340  Mobility Specialist Time Calculation (min) (ACUTE ONLY) 20 min   Received in bed and agreed to mobility, no issues ambulating, returned to bed with all needs met.  Cyndee Ada Mobility Specialist

## 2024-10-11 NOTE — Discharge Summary (Signed)
 Physician Discharge Summary  Patient ID: DARLY FAILS MRN: [id number] DOB/AGE: 78-Jun-1947 78 y.o.  Admit date: 10/09/2024 Discharge date: 10/11/2024  Admission Diagnoses:  Discharge Diagnoses:  Principal Problem:   Prostate hypertrophy   Discharged Condition: good  Hospital Course: 78 year old male underwent TURP on 10/09/2024.  Tolerated the procedure well was stable postoperatively.  The following day his urine was initially light pink but became dark red with ambulation.  Therefore, CBI reinitiated and kept another night.  Had a clogged catheter this morning and nursing irrigated some old clot.  On rounds this morning output was clear off CBI with no sign of active bleeding.  Consults: None  Significant Diagnostic Studies: None  Treatments: surgery: TURP  Discharge Exam: Blood pressure (!) 103/54, pulse 72, temperature 97.9 F (36.6 C), temperature source Oral, resp. rate 13, height 5' 9 (1.753 m), weight 85.7 kg, SpO2 94%. General appearance: alert, no acute distress Adequate perfusion of extremities Nonlabored respiration Three-way Foley catheter in place draining clear off CBI  Disposition: Discharge disposition: 01-Home or Self Care        Allergies as of 10/11/2024       Reactions   Amitriptyline  Hcl Other (See Comments)   Sleeping for long periods of time   No Known Allergies         Medication List     STOP taking these medications    HYDROcodone -acetaminophen  7.5-325 MG tablet Commonly known as: NORCO   tamsulosin  0.4 MG Caps capsule Commonly known as: FLOMAX        TAKE these medications    amLODipine  2.5 MG tablet Commonly known as: NORVASC  Take 1 tablet (2.5 mg total) by mouth daily.   amphetamine -dextroamphetamine  25 MG 24 hr capsule Commonly known as: ADDERALL XR Take 25 mg by mouth every morning.   aspirin -acetaminophen -caffeine  250-250-65 MG tablet Commonly known as: EXCEDRIN  MIGRAINE Take 2 tablets by mouth every 6 (six)  hours as needed for headache.   atorvastatin 20 MG tablet Commonly known as: LIPITOR Take 20 mg by mouth daily.   buPROPion 150 MG 24 hr tablet Commonly known as: WELLBUTRIN XL Take 150 mg by mouth every morning.   furosemide  40 MG tablet Commonly known as: LASIX  Take one tablet by mouth every other day x 4 doses then stop.   hydrochlorothiazide  12.5 MG tablet Commonly known as: HYDRODIURIL  Take 12.5 mg by mouth every morning.   metFORMIN  500 MG 24 hr tablet Commonly known as: GLUCOPHAGE -XR Take 1,000 mg by mouth every morning.   olmesartan  40 MG tablet Commonly known as: BENICAR  Take 40 mg by mouth daily.   oxyCODONE -acetaminophen  5-325 MG tablet Commonly known as: Percocet Take 1 tablet by mouth every 6 (six) hours as needed for moderate pain (pain score 4-6) or severe pain (pain score 7-10) (post-operatively).   senna-docusate 8.6-50 MG tablet Commonly known as: Senokot-S Take 1 tablet by mouth 2 (two) times daily. While taking strong pain meds to prevent constipation.   sulfamethoxazole-trimethoprim 800-160 MG tablet Commonly known as: BACTRIM DS Take 1 tablet by mouth 2 (two) times daily. X 3 days to prevent post-op infection. Notes to patient: Take with food   tadalafil 5 MG tablet Commonly known as: CIALIS Take 5 mg by mouth daily.   ZINC PO Take 1 tablet by mouth daily.        Follow-up Information     Alvaro Ricardo KATHEE Raddle., MD Follow up on 10/12/2024.   Specialty: Urology Why: at 1:30 for MD visit, pathology review, and catheter  removal. Contact information: [address] [place] [address] [phone]                 Signed: Sherwood JONETTA Edison, III 10/11/2024, 11:42 AM

## 2024-10-13 LAB — SURGICAL PATHOLOGY

## 2024-12-07 ENCOUNTER — Encounter: Payer: Self-pay | Admitting: Student

## 2024-12-07 ENCOUNTER — Ambulatory Visit: Admitting: Student

## 2024-12-07 ENCOUNTER — Encounter

## 2024-12-07 ENCOUNTER — Ambulatory Visit: Payer: Self-pay | Admitting: Cardiology

## 2024-12-07 VITALS — BP 134/72 | HR 102 | Ht 69.0 in

## 2024-12-07 DIAGNOSIS — I10 Essential (primary) hypertension: Secondary | ICD-10-CM | POA: Insufficient documentation

## 2024-12-07 DIAGNOSIS — I442 Atrioventricular block, complete: Secondary | ICD-10-CM | POA: Insufficient documentation

## 2024-12-07 DIAGNOSIS — I4729 Other ventricular tachycardia: Secondary | ICD-10-CM | POA: Diagnosis not present

## 2024-12-07 DIAGNOSIS — I4719 Other supraventricular tachycardia: Secondary | ICD-10-CM | POA: Diagnosis not present

## 2024-12-07 DIAGNOSIS — Z950 Presence of cardiac pacemaker: Secondary | ICD-10-CM | POA: Diagnosis not present

## 2024-12-07 LAB — CUP PACEART INCLINIC DEVICE CHECK
Battery Remaining Longevity: 15 mo
Battery Voltage: 2.89 V
Brady Statistic AP VP Percent: 17.8 %
Brady Statistic AP VS Percent: 0.01 %
Brady Statistic AS VP Percent: 82.07 %
Brady Statistic AS VS Percent: 0.12 %
Brady Statistic RA Percent Paced: 17.78 %
Brady Statistic RV Percent Paced: 99.81 %
Date Time Interrogation Session: 20260105123149
Implantable Lead Connection Status: 753985
Implantable Lead Connection Status: 753985
Implantable Lead Implant Date: 20160715
Implantable Lead Implant Date: 20171205
Implantable Lead Location: 753859
Implantable Lead Location: 753860
Implantable Lead Model: 5076
Implantable Lead Model: 5076
Implantable Pulse Generator Implant Date: 20171205
Lead Channel Impedance Value: 323 Ohm
Lead Channel Impedance Value: 418 Ohm
Lead Channel Impedance Value: 494 Ohm
Lead Channel Impedance Value: 570 Ohm
Lead Channel Pacing Threshold Amplitude: 0.375 V
Lead Channel Pacing Threshold Amplitude: 0.75 V
Lead Channel Pacing Threshold Pulse Width: 0.4 ms
Lead Channel Pacing Threshold Pulse Width: 0.4 ms
Lead Channel Sensing Intrinsic Amplitude: 13.875 mV
Lead Channel Sensing Intrinsic Amplitude: 13.875 mV
Lead Channel Sensing Intrinsic Amplitude: 2.75 mV
Lead Channel Sensing Intrinsic Amplitude: 2.75 mV
Lead Channel Setting Pacing Amplitude: 1.5 V
Lead Channel Setting Pacing Amplitude: 2.5 V
Lead Channel Setting Pacing Pulse Width: 0.4 ms
Lead Channel Setting Sensing Sensitivity: 4 mV
Zone Setting Status: 755011
Zone Setting Status: 755011

## 2024-12-07 NOTE — Patient Instructions (Signed)
 Medication Instructions:  No medication changes today. *If you need a refill on your cardiac medications before your next appointment, please call your pharmacy*  Lab Work: No labwork ordered today. If you have labs (blood work) drawn today and your tests are completely normal, you will receive your results only by: MyChart Message (if you have MyChart) OR A paper copy in the mail If you have any lab test that is abnormal or we need to change your treatment, we will call you to review the results.  Testing/Procedures: No testing ordered today  Follow-Up: At [facility], you and your health needs are our priority.  As part of our continuing mission to provide you with exceptional heart care, our providers are all part of one team.  This team includes your primary Cardiologist (physician) and Advanced Practice Providers or APPs (Physician Assistants and Nurse Practitioners) who all work together to provide you with the care you need, when you need it.  Your next appointment:   12 month(s)  Provider:   You may see Will Cortland Ding, MD or one of the following Advanced Practice Providers on your designated Care Team:   Mertha Abrahams, [place] [address] [place], PA-C Suzann Riddle, NP Creighton Doffing, NP    We recommend signing up for the patient portal called "MyChart".  Sign up information is provided on this After Visit Summary.  MyChart is used to connect with patients for Virtual Visits (Telemedicine).  Patients are able to view lab/test results, encounter notes, upcoming appointments, etc.  Non-urgent messages can be sent to your provider as well.   To learn more about what you can do with MyChart, go to [email or web address].

## 2024-12-07 NOTE — Progress Notes (Signed)
" °  Electrophysiology Office Note:   ID:  Nicholson, Starace 07-17-1946, MRN [id number]  Primary Cardiologist: None Electrophysiologist: Will Gladis Norton, MD      History of Present Illness:   HANEEF HALLQUIST is a 79 y.o. male with h/o CHB s/p PPM, HTN, Atach, HFpEF and NSVT seen today for routine electrophysiology followup.   Since last being seen in our clinic the patient reports doing well from a cardiac perspective. Overall, he denies chest pain, palpitations, dyspnea, PND, orthopnea, nausea, vomiting, dizziness, syncope, edema, weight gain, or early satiety.   Review of systems complete and found to be negative unless listed in HPI.   EP Information / Studies Reviewed:    EKG is ordered today. Personal review as below.  EKG Interpretation Date/Time:  Monday December 07 2024 11:58:12 EST Ventricular Rate:  102 PR Interval:  190 QRS Duration:  168 QT Interval:  390 QTC Calculation: 508 R Axis:   -86  Text Interpretation: Atrial-sensed ventricular-paced rhythm with occasional Premature ventricular complexes Confirmed by Lesia Heck ([id number]) on 12/07/2024 12:01:24 PM    PPM Interrogation-  reviewed in detail today,  See PACEART report.  Arrhythmia/Device History MDT Dual Chamber PPM for CHB 06/2015: RV lead revision 11/2016 for elevated threshold   Physical Exam:   VS:  BP 134/72   Pulse (!) 102   Ht 5' 9 (1.753 m)   SpO2 98%   BMI 27.91 kg/m    Wt Readings from Last 3 Encounters:  10/09/24 189 lb (85.7 kg)  10/07/24 189 lb (85.7 kg)  12/06/23 215 lb 3.2 oz (97.6 kg)     GEN: No acute distress  NECK: No JVD; No carotid bruits CARDIAC: Regular rate and rhythm, no murmurs, rubs, gallops RESPIRATORY:  Clear to auscultation without rales, wheezing or rhonchi  ABDOMEN: Soft, non-tender, non-distended EXTREMITIES:  No edema; No deformity   ASSESSMENT AND PLAN:    CHB s/p [employer] PPM  Normal PPM function See Pace Art report No changes today  HFpEF Volume status  stable on exam  HTN Continue amlodipine   Disposition:   Follow up with EP Team in 12 months  Signed, Ozell Prentice Lesia, PA-C  "

## 2024-12-09 ENCOUNTER — Telehealth: Payer: Self-pay | Admitting: Cardiology

## 2024-12-09 NOTE — Telephone Encounter (Signed)
 Spoke with patient. No notes or encounters that someone had reach out to patient. Said someone called him today at 12:30. Will send to EP team for awareness. No other needs at this time.

## 2024-12-09 NOTE — Telephone Encounter (Signed)
 Patient states that he was returning call. But I dont see any notes on the acct. Please advise

## 2025-03-08 ENCOUNTER — Encounter

## 2025-06-07 ENCOUNTER — Encounter

## 2025-09-06 ENCOUNTER — Encounter
